# Patient Record
Sex: Female | Born: 1965
Health system: Southern US, Community
[De-identification: ages and names within clinical notes are randomized; demographics above are authoritative.]

## PROBLEM LIST (undated history)

## (undated) DIAGNOSIS — R0602 Shortness of breath: Secondary | ICD-10-CM

## (undated) DIAGNOSIS — J449 Chronic obstructive pulmonary disease, unspecified: Secondary | ICD-10-CM

## (undated) DIAGNOSIS — M1990 Unspecified osteoarthritis, unspecified site: Secondary | ICD-10-CM

## (undated) DIAGNOSIS — M2550 Pain in unspecified joint: Secondary | ICD-10-CM

## (undated) DIAGNOSIS — M2540 Effusion, unspecified joint: Secondary | ICD-10-CM

## (undated) DIAGNOSIS — R569 Unspecified convulsions: Secondary | ICD-10-CM

## (undated) DIAGNOSIS — R1310 Dysphagia, unspecified: Secondary | ICD-10-CM

## (undated) DIAGNOSIS — K219 Gastro-esophageal reflux disease without esophagitis: Secondary | ICD-10-CM

## (undated) DIAGNOSIS — Z8709 Personal history of other diseases of the respiratory system: Secondary | ICD-10-CM

## (undated) DIAGNOSIS — J45909 Unspecified asthma, uncomplicated: Secondary | ICD-10-CM

## (undated) DIAGNOSIS — E785 Hyperlipidemia, unspecified: Secondary | ICD-10-CM

## (undated) DIAGNOSIS — Z87442 Personal history of urinary calculi: Secondary | ICD-10-CM

## (undated) HISTORY — PX: ERCP: SHX60

---

## 1992-08-24 HISTORY — PX: LUMBAR LAMINECTOMY: SHX95

## 2000-08-24 HISTORY — PX: CHOLECYSTECTOMY: SHX55

## 2012-06-02 ENCOUNTER — Ambulatory Visit (INDEPENDENT_AMBULATORY_CARE_PROVIDER_SITE_OTHER): Payer: Self-pay

## 2012-06-02 ENCOUNTER — Encounter: Payer: Self-pay | Admitting: Family Medicine

## 2012-06-02 ENCOUNTER — Ambulatory Visit (INDEPENDENT_AMBULATORY_CARE_PROVIDER_SITE_OTHER): Payer: Self-pay | Admitting: Family Medicine

## 2012-06-02 VITALS — BP 127/80 | HR 66 | Ht 62.0 in | Wt 209.0 lb

## 2012-06-02 DIAGNOSIS — M25551 Pain in right hip: Secondary | ICD-10-CM

## 2012-06-02 DIAGNOSIS — M25552 Pain in left hip: Secondary | ICD-10-CM

## 2012-06-02 DIAGNOSIS — M169 Osteoarthritis of hip, unspecified: Secondary | ICD-10-CM | POA: Insufficient documentation

## 2012-06-02 DIAGNOSIS — J45909 Unspecified asthma, uncomplicated: Secondary | ICD-10-CM | POA: Insufficient documentation

## 2012-06-02 DIAGNOSIS — M25559 Pain in unspecified hip: Secondary | ICD-10-CM

## 2012-06-02 MED ORDER — MELOXICAM 15 MG PO TABS: 15.0000 mg | ORAL_TABLET | Freq: Every day | ORAL | Status: DC

## 2012-06-02 NOTE — Patient Instructions (Addendum)
We will call you with the xray results.   Make an appt with Dr. Karie Schwalbe for your hips.

## 2012-06-02 NOTE — Progress Notes (Signed)
Subjective:    Patient ID: Robin [name], female    DOB: October 12, 1965, 46 y.o.   MRN: [id number]  HPI Asthma- Says her asthma flares when the weather changes or when has a cold.  Does well on Advair.  Doing well.  She is out of her albuterol.    Hip pain - Pain since 2007. Saw a PA for awhile.  Was told could be OA, says has been getting worse. Saw a ortho specialist  6 months ago, who treated her for spinal stenosis from her back surgery (1994). Started on meloxicam.  Needs refill.  Never had  Imaging of her hips. Has dec ROM and hard to put on her socks.  She currently uses a cane. Her pain is worse when she's been sitting for prolonged periods. No known trauma or injuries.   Review of Systems  Constitutional: Negative for fever, diaphoresis and unexpected weight change.  HENT: Positive for tinnitus. Negative for hearing loss and rhinorrhea.   Eyes: Negative for visual disturbance.  Respiratory: Positive for cough. Negative for wheezing.   Cardiovascular: Positive for chest pain. Negative for palpitations.  Gastrointestinal: Negative for nausea, vomiting, diarrhea and blood in stool.  Genitourinary: Negative for vaginal bleeding, vaginal discharge and difficulty urinating.  Musculoskeletal: Positive for joint swelling and arthralgias. Negative for myalgias.  Skin: Negative for rash.  Neurological: Negative for headaches.  Hematological: Negative for adenopathy. Does not bruise/bleed easily.  Psychiatric/Behavioral: Negative for disturbed wake/sleep cycle and dysphoric mood. The patient is not nervous/anxious.    BP 127/80  Pulse 66  Ht 5\' 2"  (1.575 m)  Wt 209 lb (94.802 kg)  BMI 38.23 kg/m2    No Known Allergies  History reviewed. No pertinent past medical history.  Past Surgical History  Procedure Date  . Lumbar laminectomy 1994  . Cholecystectomy 2002    History   Social History  . Marital Status: Married    Spouse Name: N/A    Number of Children: 1  . Years of  Education: N/A   Occupational History  . House Keeper    Social History Main Topics  . Smoking status: Former Smoker    Quit date: 01/11/2012  . Smokeless tobacco: Not on file  . Alcohol Use: No  . Drug Use: No  . Sexually Active: Not Currently   Other Topics Concern  . Not on file   Social History Narrative  . No narrative on file    Family History  Problem Relation Age of Onset  . Alcohol abuse Brother   . Alcohol abuse Sister   . Heart attack Father   . Diabetes      grandparents.     Outpatient Encounter Prescriptions as of 06/02/2012  Medication Sig Dispense Refill  . albuterol (PROVENTIL HFA;VENTOLIN HFA) 108 (90 BASE) MCG/ACT inhaler Inhale 1 puff into the lungs as needed.      . Fluticasone-Salmeterol (ADVAIR) 250-50 MCG/DOSE AEPB Inhale 2 puffs into the lungs as needed.      . Ibuprofen (ADVIL) 200 MG CAPS Take 1 capsule by mouth 3 (three) times daily as needed.      . meloxicam (MOBIC) 15 MG tablet Take 1 tablet (15 mg total) by mouth daily.  30 tablet  3  . DISCONTD: meloxicam (MOBIC) 15 MG tablet Take 15 mg by mouth daily.              Objective:   Physical Exam  Constitutional: She is oriented to person, place, and time. She appears well-developed  and well-nourished.  HENT:  Head: Normocephalic and atraumatic.  Right Ear: External ear normal.  Left Ear: External ear normal.  Nose: Nose normal.  Mouth/Throat: Oropharynx is clear and moist.       Left TMand canals are clear. Right TM blocked by cerumen  Eyes: Conjunctivae normal and EOM are normal. Pupils are equal, round, and reactive to light.  Neck: Neck supple. No thyromegaly present.  Cardiovascular: Normal rate, regular rhythm and normal heart sounds.   Pulmonary/Chest: Effort normal and breath sounds normal. She has no wheezes.  Musculoskeletal:       Hips with normal flexion. Decreased internal and external rotation bilaterally. She had significant pain with rotation. Hip strength is 5 out  of 5. Knee strength is 5 out of 5 and ankle strength is 5 out of 5.  Lymphadenopathy:    She has no cervical adenopathy.  Neurological: She is alert and oriented to person, place, and time.  Skin: Skin is warm and dry.  Psychiatric: She has a normal mood and affect.          Assessment & Plan:  Asthma - currently well controlled on Advair. She does not need refills except for the albuterol. We gave her a sample. Call if she's having any problems or concerns. She did decline the flu shot that I did recommend that she is high risk with her asthma.  Hip Pain - I. would like to start with at least plain film x-rays which she has never had this evaluated she has some significant arthritis bone spurring, or loose body. She seems to be fairly debilitated by using a cane and has a lot of difficulty getting up on the exam table. Depending on the results potentially like to see my partner Dr. Benjamin Stain for further evaluation and treatment. She might benefit from injections for pain relief. 4 MA be severe enough that she might need hip replacement. For now refill her MOBIC since it does seem to be helping her pain.

## 2012-06-03 ENCOUNTER — Ambulatory Visit (INDEPENDENT_AMBULATORY_CARE_PROVIDER_SITE_OTHER): Payer: Self-pay | Admitting: Sports Medicine

## 2012-06-03 ENCOUNTER — Encounter: Payer: Self-pay | Admitting: Sports Medicine

## 2012-06-03 VITALS — BP 123/81 | HR 63 | Wt 211.0 lb

## 2012-06-03 DIAGNOSIS — M169 Osteoarthritis of hip, unspecified: Secondary | ICD-10-CM

## 2012-06-03 DIAGNOSIS — M1610 Unilateral primary osteoarthritis, unspecified hip: Secondary | ICD-10-CM

## 2012-06-03 MED ORDER — TRAMADOL HCL 50 MG PO TABS
50.0000 mg | ORAL_TABLET | Freq: Four times a day (QID) | ORAL | Status: DC | PRN
Start: 2012-06-03 — End: 2012-10-11

## 2012-06-03 NOTE — Progress Notes (Signed)
Subjective:    I'm seeing this patient as a consultation for:  Dr. Linford Arnold  CC: Bilateral groin pain  HPI: Robin [name] is a very pleasant 46 year old female with an unfortunate history of 5 years of pain in both hips. She has had x-rays in the past that show moderate to severe degenerative joint disease bilaterally. Unfortunately, she has not had insurance, and has been unable to find an orthopedic surgeon that could operate. The pain is localized, and makes walking very difficult. In fact, she has had several falls. She does ambulate with a cane now. She has never had intra-articular injections, and has been using NSAIDs for pain.  Past medical history, Surgical history, Family history, Social history, Allergies, and medications have been entered into the medical record, reviewed, and no changes needed.   Review of Systems: No headache, visual changes, nausea, vomiting, diarrhea, constipation, dizziness, abdominal pain, skin rash, fevers, chills, night sweats, weight loss, swollen lymph nodes, body aches, joint swelling, muscle aches, chest pain, or shortness of breath.   Objective:   Vitals:  Afebrile, vital signs stable. General: Well Developed, well nourished, and in no acute distress.  Neuro/Psych: Alert and oriented x3, extra-ocular muscles intact, able to move all 4 extremities.  Skin: Warm and dry, no rashes noted.  Respiratory: Not using accessory muscles, speaking in full sentences, trachea midline.  Cardiovascular: Pulses palpable, no extremity edema. Abdomen: Does not appear distended. Bilateral Hip: ROM Range of motion is extremely limited by pain, she has approximately 5 of internal, and 10 of external rotation bilaterally.  Strength IR: 5/5, ER: 5/5, Flexion: 5/5, Extension: 5/5, Abduction: 5/5, Adduction: 5/5 Pelvic alignment unremarkable to inspection and palpation. Greater trochanter without tenderness to palpation. No tenderness over piriformis and greater  trochanter.  I did review her x-rays independently, her AP pelvis shows end-stage DJD of both hip joints. These were not weightbearing films.   Procedure: Real-time Ultrasound Guided Injection of left femoral acetabular joint Device: GE Logiq E  Ultrasound guided injection is preferred based studies that show increased duration, increased effect, greater accuracy, decreased procedural pain, increased response rate, and decreased cost with ultrasound guided versus blind injection.  Verbal informed consent obtained.  Time-out conducted.  Noted no overlying erythema, induration, or other signs of local infection.  Skin prepped in a sterile fashion.  Local anesthesia: Topical Ethyl chloride.  With sterile technique and under real time ultrasound guidance:  I imaged her joints, the anatomy was very distorted. I guided a spinal needle under ultrasound guidance to the femoral head/neck junction, and contacted bone. At that point I injected 2 cc of Kenalog 40, and 4 cc of lidocaine into the femoral acetabular joint, the capsule was seen distending under ultrasound guidance. Completed without difficulty  Pain immediately resolved suggesting accurate placement of the medication.  Advised to call if fevers/chills, erythema, induration, drainage, or persistent bleeding.  Images permanently stored and available for review in the ultrasound unit.  Impression: Technically successful ultrasound guided injection.  Procedure: Real-time Ultrasound Guided Injection of right femoral acetabular joint Device: GE Logiq E  Ultrasound guided injection is preferred based studies that show increased duration, increased effect, greater accuracy, decreased procedural pain, increased response rate, and decreased cost with ultrasound guided versus blind injection.  Verbal informed consent obtained.  Time-out conducted.  Noted no overlying erythema, induration, or other signs of local infection.  Skin prepped in a sterile  fashion.  Local anesthesia: Topical Ethyl chloride.  With sterile technique and under real time ultrasound  guidance:  I imaged her joints, the anatomy was very distorted. I guided a spinal needle under ultrasound guidance to the femoral head/neck junction, and contacted bone. At that point I injected 2 cc of Kenalog 40, and 4 cc of lidocaine into the femoral acetabular joint, the capsule was seen distending under ultrasound guidance. Completed without difficulty  Pain immediately resolved suggesting accurate placement of the medication.  Advised to call if fevers/chills, erythema, induration, drainage, or persistent bleeding.  Images permanently stored and available for review in the ultrasound unit.  Impression: Technically successful ultrasound guided injection.  Impression and Recommendations:   This case required medical decision making of moderate complexity.

## 2012-06-03 NOTE — Assessment & Plan Note (Addendum)
This is end stage. Bilateral ultrasound guided femoral acetabular injections given today for relief. She notes that this is the first time in 5 years that she has been completely without pain, she was informed however that the pain relief is transient due to the lidocaine, but that the steroid would pickup in a day or 2. Referral to [facility], Dr. Gean Birchwood. She will need bilateral hip replacements eventually. Tramadol for pain in the meantime. I can perform this injection procedure up to every 3 months as needed in this patient until we can find her an orthopedic surgeon.

## 2012-06-06 ENCOUNTER — Encounter: Payer: Self-pay | Admitting: Family Medicine

## 2012-07-04 ENCOUNTER — Telehealth: Payer: Self-pay | Admitting: *Deleted

## 2012-07-04 ENCOUNTER — Ambulatory Visit (INDEPENDENT_AMBULATORY_CARE_PROVIDER_SITE_OTHER): Payer: Self-pay | Admitting: Family Medicine

## 2012-07-04 ENCOUNTER — Encounter: Payer: Self-pay | Admitting: Family Medicine

## 2012-07-04 VITALS — BP 130/69 | HR 74 | Ht 62.0 in | Wt 206.0 lb

## 2012-07-04 DIAGNOSIS — Z0000 Encounter for general adult medical examination without abnormal findings: Secondary | ICD-10-CM

## 2012-07-04 LAB — LIPID PANEL
Cholesterol: 198 mg/dL (ref 0–200)
Total CHOL/HDL Ratio: 3.6 Ratio
Triglycerides: 118 mg/dL (ref <150)
VLDL: 24 mg/dL (ref 0–40)

## 2012-07-04 LAB — COMPLETE METABOLIC PANEL WITH GFR
AST: 12 U/L (ref 0–37)
Albumin: 4.1 g/dL (ref 3.5–5.2)
Alkaline Phosphatase: 61 U/L (ref 39–117)
BUN: 18 mg/dL (ref 6–23)
Calcium: 9.1 mg/dL (ref 8.4–10.5)
Creat: 0.68 mg/dL (ref 0.50–1.10)
GFR, Est Non African American: >89 mL/min
Glucose, Bld: 93 mg/dL (ref 70–99)
Potassium: 4.2 mEq/L (ref 3.5–5.3)

## 2012-07-04 MED ORDER — ALBUTEROL SULFATE (5 MG/ML) 0.5% IN NEBU: 2.5000 mg | INHALATION_SOLUTION | Freq: Four times a day (QID) | RESPIRATORY_TRACT | Status: DC | PRN

## 2012-07-04 MED ORDER — ALBUTEROL SULFATE (2.5 MG/3ML) 0.083% IN NEBU: 2.5000 mg | INHALATION_SOLUTION | Freq: Four times a day (QID) | RESPIRATORY_TRACT | Status: DC | PRN

## 2012-07-04 NOTE — Patient Instructions (Addendum)
Keep up a regular exercise program and make sure you are eating a healthy diet Try to eat 4 servings of dairy a day, or if you are lactose intolerant take a calcium with vitamin D daily.  Your vaccines are up to date.  We will call you with your lab results. If you don't here from us in about a week then please give us a call at [phone].  

## 2012-07-04 NOTE — Telephone Encounter (Signed)
[facility] called -Amber Wants to change Albuterol 0.5 % to  to 0.083% there is not mixing and cheaper

## 2012-07-04 NOTE — Progress Notes (Signed)
  Subjective:     Robin [name] is a 46 y.o. female and is here for a comprehensive physical exam. The patient reports no problems.  History   Social History  . Marital Status: Married    Spouse Name: N/A    Number of Children: 1  . Years of Education: N/A   Occupational History  . House Keeper    Social History Main Topics  . Smoking status: Former Smoker    Quit date: 10/07/2001  . Smokeless tobacco: Not on file  . Alcohol Use: No  . Drug Use: No  . Sexually Active: Not Currently   Other Topics Concern  . Not on file   Social History Narrative   No regular exercise.    Health Maintenance  Topic Date Due  . Pap Smear  03/16/1982  . Influenza Vaccine  04/24/2012  . Tetanus/tdap  07/04/2022    The following portions of the patient's history were reviewed and updated as appropriate: allergies, current medications, past family history, past medical history, past social history, past surgical history and problem list.  Review of Systems A comprehensive review of systems was negative.   Objective:    BP 130/69  Pulse 74  Ht 5\' 2"  (1.575 m)  Wt 206 lb (93.441 kg)  BMI 37.68 kg/m2 General appearance: alert, cooperative, appears stated age and moderately obese Head: Normocephalic, without obvious abnormality, atraumatic Eyes: conj clear, EOMii, PEERLA Ears: normal TM's and external ear canals both ears Nose: Nares normal. Septum midline. Mucosa normal. No drainage or sinus tenderness. Throat: lips, mucosa, and tongue normal; teeth and gums normal Neck: no adenopathy, no carotid bruit, no JVD, supple, symmetrical, trachea midline and thyroid not enlarged, symmetric, no tenderness/mass/nodules Back: symmetric, no curvature. ROM normal. No CVA tenderness. Lungs: clear to auscultation bilaterally Breasts: normal appearance, no masses or tenderness Heart: regular rate and rhythm, S1, S2 normal, no murmur, click, rub or gallop Abdomen: soft, non-tender; bowel sounds normal;  no masses,  no organomegaly Extremities: extremities normal, atraumatic, no cyanosis or edema Pulses: 2+ and symmetric Skin: Skin color, texture, turgor normal. No rashes or lesions Lymph nodes: Cervical, supraclavicular, and axillary nodes normal. Neurologic: Alert and oriented X 3, normal strength and tone. Normal symmetric reflexes. Normal coordination and gait    Assessment:    Healthy female exam.      Plan:     See After Visit Summary for Counseling Recommendations  Keep up a regular exercise program and make sure you are eating a healthy diet Try to eat 4 servings of dairy a day, or if you are lactose intolerant take a calcium with vitamin D daily.  Your vaccines are up to date.   Discussed need for Tdap an d flu vaccine.  Agreed to Tdap but unfortunatlely we are out. She will think about the flu vaccine.  Due for screening labs.

## 2012-07-04 NOTE — Telephone Encounter (Signed)
Ok, sounds good, lets change on the med list so when we re-order in the future it is right.

## 2012-07-05 ENCOUNTER — Telehealth: Payer: Self-pay | Admitting: Family Medicine

## 2012-07-05 ENCOUNTER — Encounter: Payer: Self-pay | Admitting: Sports Medicine

## 2012-07-05 ENCOUNTER — Ambulatory Visit (INDEPENDENT_AMBULATORY_CARE_PROVIDER_SITE_OTHER): Payer: Self-pay | Admitting: Sports Medicine

## 2012-07-05 VITALS — BP 120/72 | HR 67 | Wt 207.0 lb

## 2012-07-05 DIAGNOSIS — M169 Osteoarthritis of hip, unspecified: Secondary | ICD-10-CM

## 2012-07-05 DIAGNOSIS — M1610 Unilateral primary osteoarthritis, unspecified hip: Secondary | ICD-10-CM

## 2012-07-05 MED ORDER — ACETAMINOPHEN ER 650 MG PO TBCR: 1300.0000 mg | EXTENDED_RELEASE_TABLET | Freq: Three times a day (TID) | ORAL | Status: DC | PRN

## 2012-07-05 NOTE — Progress Notes (Signed)
SPORTS MEDICINE CONSULTATION REPORT  Subjective:    CC: Followup  HPI: Robin [name] comes back for followup after hip joint injections. She has severe, end-stage DJD on both sides. I injected her femoral acetabular joint at the last visit, this was a very technically difficult procedure. Her pain improved significantly, and remains significantly improved. She used her Mobic, and is now on tramadol which also helps significantly.   Past medical history, Surgical history, Family history, Social history, Allergies, and medications have been entered into the medical record, reviewed, and no changes needed.   Review of Systems: No headache, visual changes, nausea, vomiting, diarrhea, constipation, dizziness, abdominal pain, skin rash, fevers, chills, night sweats, weight loss, swollen lymph nodes, body aches, joint swelling, muscle aches, chest pain, or shortness of breath.   Objective:   Vitals:  Afebrile, vital signs stable. General: Well Developed, well nourished, and in no acute distress.  Neuro/Psych: Alert and oriented x3, extra-ocular muscles intact, able to move all 4 extremities.  Skin: Warm and dry, no rashes noted.  Respiratory: Not using accessory muscles, speaking in full sentences, trachea midline.  Cardiovascular: Pulses palpable, no extremity edema. Abdomen: Does not appear distended. Left hip has about 10 of internal rotation, right hip has about 5. She is painful at the end of both of these. She ambulates with a cane with a mildly antalgic gait.   Impression and Recommendations:   This case required medical decision making of moderate complexity.

## 2012-07-05 NOTE — Telephone Encounter (Signed)
Patient was seen today and had lab work done. She advised that her phone is currently off and if her lab results were to come in if you can mail the results to her. Thanks

## 2012-07-05 NOTE — Assessment & Plan Note (Signed)
Significantly improved after bilateral intra-articular hip joint injections one month ago. She should continue to work on range of motion, stretching, strengthening, and she will read as mobile as possible. She should continue tramadol, I would also like her to acetaminophen 652 tabs 3 times a day. She can come back in one to 2 months, and I am happy to reinject both sides.

## 2012-07-06 ENCOUNTER — Telehealth: Payer: Self-pay | Admitting: *Deleted

## 2012-07-07 NOTE — Telephone Encounter (Signed)
See other message

## 2012-08-01 ENCOUNTER — Encounter: Payer: Self-pay | Admitting: Sports Medicine

## 2012-08-01 ENCOUNTER — Ambulatory Visit (INDEPENDENT_AMBULATORY_CARE_PROVIDER_SITE_OTHER): Payer: Self-pay | Admitting: Sports Medicine

## 2012-08-01 VITALS — BP 130/79 | HR 93 | Wt 208.0 lb

## 2012-08-01 DIAGNOSIS — M169 Osteoarthritis of hip, unspecified: Secondary | ICD-10-CM

## 2012-08-01 DIAGNOSIS — M1610 Unilateral primary osteoarthritis, unspecified hip: Secondary | ICD-10-CM

## 2012-08-01 NOTE — Progress Notes (Signed)
SPORTS MEDICINE CONSULTATION REPORT  Subjective:    CC: Followup  HPI: Robin [name] has end-stage bilateral femoral acetabular degenerative joint disease.  I injected both femoral acetabular joints approximately 2 months ago. She got approximately 3 weeks of benefit. We also sent her to Dr. Turner Daniels with [facility], he is going to replace her right hip, and they will work out a payment plan as well as some funding for post surgical therapy. Today, she does desire an additional set of intra-articular injections which I think are quite appropriate. Her pain is localized in both groin, does not radiate, it is worse with walking, and is severe.  Past medical history, Surgical history, Family history, Social history, Allergies, and medications have been entered into the medical record, reviewed, and no changes needed.   Review of Systems: No headache, visual changes, nausea, vomiting, diarrhea, constipation, dizziness, abdominal pain, skin rash, fevers, chills, night sweats, weight loss, swollen lymph nodes, body aches, joint swelling, muscle aches, chest pain, shortness of breath, mood changes, visual or auditory hallucinations.   Objective:   Vitals:  Afebrile, vital signs stable. General: Well Developed, well nourished, and in no acute distress.  Neuro/Psych: Alert and oriented x3, extra-ocular muscles intact, able to move all 4 extremities.  Skin: Warm and dry, no rashes noted.  Respiratory: Not using accessory muscles, speaking in full sentences, trachea midline.  Cardiovascular: Pulses palpable, no extremity edema. Abdomen: Does not appear distended. Bilateral Hip: ROM Range of motion is extremely limited by pain, she has approximately 5 of internal, and 10 of external rotation bilaterally.  Strength IR: 5/5, ER: 5/5, Flexion: 5/5, Extension: 5/5, Abduction: 5/5, Adduction: 5/5 Pelvic alignment unremarkable to inspection and palpation. Greater trochanter without tenderness to  palpation. No tenderness over piriformis and greater trochanter.  Procedure: Real-time Ultrasound Guided Injection of left femoral acetabular joint Device: GE Logiq E  Ultrasound guided injection is preferred based studies that show increased duration, increased effect, greater accuracy, decreased procedural pain, increased response rate, and decreased cost with ultrasound guided versus blind injection.  Verbal informed consent obtained.  Time-out conducted.  Noted no overlying erythema, induration, or other signs of local infection.  Skin prepped in a sterile fashion.  Local anesthesia: Topical Ethyl chloride.  With sterile technique and under real time ultrasound guidance:  I imaged her joints, the anatomy was very distorted. I guided a spinal needle under ultrasound guidance to the femoral head/neck junction, and contacted bone. At that point I injected 2 cc of Kenalog 40, and 4 cc of lidocaine into the femoral acetabular joint, the capsule was seen distending under ultrasound guidance. Completed without difficulty  Pain immediately resolved suggesting accurate placement of the medication.  Advised to call if fevers/chills, erythema, induration, drainage, or persistent bleeding.  Images permanently stored and available for review in the ultrasound unit.  Impression: Technically successful ultrasound guided injection.  Procedure: Real-time Ultrasound Guided Injection of right femoral acetabular joint Device: GE Logiq E  Ultrasound guided injection is preferred based studies that show increased duration, increased effect, greater accuracy, decreased procedural pain, increased response rate, and decreased cost with ultrasound guided versus blind injection.  Verbal informed consent obtained.  Time-out conducted.  Noted no overlying erythema, induration, or other signs of local infection.  Skin prepped in a sterile fashion.  Local anesthesia: Topical Ethyl chloride.  With sterile technique  and under real time ultrasound guidance:  I imaged her joints, the anatomy was very distorted. I guided a spinal needle under ultrasound guidance to  the femoral head/neck junction, and contacted bone. At that point I injected 2 cc of Kenalog 40, and 4 cc of lidocaine into the femoral acetabular joint, the capsule was seen distending under ultrasound guidance. Completed without difficulty  Pain immediately resolved suggesting accurate placement of the medication.  Advised to call if fevers/chills, erythema, induration, drainage, or persistent bleeding.  Images permanently stored and available for review in the ultrasound unit.  Impression: Technically successful ultrasound guided injection.  Impression and Recommendations:   This case required medical decision making of moderate complexity.

## 2012-08-01 NOTE — Assessment & Plan Note (Addendum)
She is getting set up with [facility] for THA. Injected both sides today. RTC prn for additional injections.  I will inject up to q 6-8 weeks until THA.

## 2012-09-23 ENCOUNTER — Other Ambulatory Visit: Payer: Self-pay | Admitting: *Deleted

## 2012-09-23 MED ORDER — FLUTICASONE-SALMETEROL 250-50 MCG/DOSE IN AEPB: 1.0000 | INHALATION_SPRAY | Freq: Two times a day (BID) | RESPIRATORY_TRACT | Status: DC

## 2012-09-23 MED ORDER — FLUTICASONE-SALMETEROL 250-50 MCG/DOSE IN AEPB: 2.0000 | INHALATION_SPRAY | RESPIRATORY_TRACT | Status: DC | PRN

## 2012-10-11 ENCOUNTER — Other Ambulatory Visit: Payer: Self-pay | Admitting: Sports Medicine

## 2012-10-17 ENCOUNTER — Ambulatory Visit (INDEPENDENT_AMBULATORY_CARE_PROVIDER_SITE_OTHER): Payer: Self-pay | Admitting: Sports Medicine

## 2012-10-17 DIAGNOSIS — M169 Osteoarthritis of hip, unspecified: Secondary | ICD-10-CM

## 2012-10-17 DIAGNOSIS — M1610 Unilateral primary osteoarthritis, unspecified hip: Secondary | ICD-10-CM

## 2012-10-17 MED ORDER — TRAMADOL HCL 50 MG PO TABS: 100.0000 mg | ORAL_TABLET | Freq: Three times a day (TID) | ORAL | Status: DC | PRN

## 2012-10-17 NOTE — Assessment & Plan Note (Addendum)
Bilateral femoral acetabular joint injection as above. To increase her pain medication dosage to 100 mg of tramadol 3 times a day, she will also use 1300 mg of acetaminophen with each tramadol dose. Return in 6 weeks as needed for further injections. She's currently undergoing approval for total hip arthroplasty.

## 2012-10-17 NOTE — Progress Notes (Signed)
  Subjective:    CC: Bilateral hip pain  HPI: This is a very pleasant 47 year old female with well known severe, end-stage bilateral femoral acetabular degenerative joint disease. I have injected both hip joints twice now, the most recent of which was approximately 2 months ago. She has been to [facility], and is currently getting approved for total hip arthroplasty. Pain is again localized in the groin, radiating around the lateral aspect of both hips, severe, worse with weightbearing. She continues to use tramadol and acetaminophen with only moderate efficacy. She does desire repeat set of injections.  Past medical history, Surgical history, Family history not pertinant except as noted below, Social history, Allergies, and medications have been entered into the medical record, reviewed, and no changes needed.   Review of Systems: No headache, visual changes, nausea, vomiting, diarrhea, constipation, dizziness, abdominal pain, skin rash, fevers, chills, night sweats, weight loss, swollen lymph nodes, body aches, joint swelling, muscle aches, chest pain, shortness of breath, mood changes, visual or auditory hallucinations.   Objective:   General: Well Developed, well nourished, and in no acute distress.  Neuro/Psych: Alert and oriented x3, extra-ocular muscles intact, able to move all 4 extremities, sensation grossly intact. Skin: Warm and dry, no rashes noted.  Respiratory: Not using accessory muscles, speaking in full sentences, trachea midline.  Cardiovascular: Pulses palpable, no extremity edema. Abdomen: Does not appear distended.  Procedure: Real-time Ultrasound Guided Injection of left femoroacetabular joint injection. Device: GE Logiq E  Ultrasound guided injection is preferred based studies that show increased duration, increased effect, greater accuracy, decreased procedural pain, increased response rate, and decreased cost with ultrasound guided versus blind injection.    Verbal informed consent obtained.  Time-out conducted.  Noted no overlying erythema, induration, or other signs of local infection.  Skin prepped in a sterile fashion.  Local anesthesia: Topical Ethyl chloride.  With sterile technique and under real time ultrasound guidance:  Anatomy appeared significantly distorted ultrasound, spinal needle advanced under real-time guidance to the femoral head/neck junction, 2 cc Kenalog 40, 4 cc lidocaine injected easily. Completed without difficulty  Pain immediately resolved suggesting accurate placement of the medication.  Advised to call if fevers/chills, erythema, induration, drainage, or persistent bleeding.  Images permanently stored and available for review in the ultrasound unit.  Impression: Technically successful ultrasound guided injection.   Procedure: Real-time Ultrasound Guided Injection of right femoroacetabular joint injection. Device: GE Logiq E  Ultrasound guided injection is preferred based studies that show increased duration, increased effect, greater accuracy, decreased procedural pain, increased response rate, and decreased cost with ultrasound guided versus blind injection.  Verbal informed consent obtained.  Time-out conducted.  Noted no overlying erythema, induration, or other signs of local infection.  Skin prepped in a sterile fashion.  Local anesthesia: Topical Ethyl chloride.  With sterile technique and under real time ultrasound guidance:  Anatomy appeared significantly distorted ultrasound, spinal needle advanced under real-time guidance to the femoral head/neck junction, 2 cc Kenalog 40, 4 cc lidocaine injected easily. Completed without difficulty  Pain immediately resolved suggesting accurate placement of the medication.  Advised to call if fevers/chills, erythema, induration, drainage, or persistent bleeding.  Images permanently stored and available for review in the ultrasound unit.  Impression: Technically  successful ultrasound guided injection.  Impression and Recommendations:   This case required medical decision making of moderate complexity.

## 2013-03-16 ENCOUNTER — Other Ambulatory Visit: Payer: Self-pay | Admitting: Sports Medicine

## 2013-03-16 ENCOUNTER — Other Ambulatory Visit: Payer: Self-pay

## 2013-03-16 DIAGNOSIS — M169 Osteoarthritis of hip, unspecified: Secondary | ICD-10-CM

## 2013-03-16 MED ORDER — TRAMADOL HCL 50 MG PO TABS: 100.0000 mg | ORAL_TABLET | Freq: Three times a day (TID) | ORAL | Status: DC | PRN

## 2013-06-29 ENCOUNTER — Other Ambulatory Visit: Payer: Self-pay

## 2013-07-11 ENCOUNTER — Other Ambulatory Visit: Payer: Self-pay | Admitting: Sports Medicine

## 2013-07-11 ENCOUNTER — Other Ambulatory Visit: Payer: Self-pay | Admitting: *Deleted

## 2013-07-11 ENCOUNTER — Other Ambulatory Visit: Payer: Self-pay

## 2013-07-11 MED ORDER — TRAMADOL HCL 50 MG PO TABS: ORAL_TABLET | ORAL | Status: DC

## 2013-07-31 ENCOUNTER — Telehealth: Payer: Self-pay

## 2013-07-31 NOTE — Telephone Encounter (Signed)
Patient called request a refill for Tramadol. Rhonda Cunningham,CMA  

## 2013-08-01 ENCOUNTER — Other Ambulatory Visit: Payer: Self-pay

## 2013-08-01 MED ORDER — TRAMADOL HCL (ER BIPHASIC) 300 MG PO CP24: 300.0000 mg | ORAL_CAPSULE | Freq: Every day | ORAL | Status: DC | PRN

## 2013-08-01 NOTE — Telephone Encounter (Signed)
Rx has been faxed to [facility] Dr.  Estelle June

## 2013-08-01 NOTE — Telephone Encounter (Signed)
One month tramadol extended release 300 mg once a day given, she needs to go ahead and set up a visit with me for consideration of another injection if she has not yet had her hip replacement. Rx in my box.

## 2013-08-07 ENCOUNTER — Other Ambulatory Visit: Payer: Self-pay

## 2013-08-07 ENCOUNTER — Ambulatory Visit: Payer: Self-pay | Admitting: Sports Medicine

## 2013-08-07 ENCOUNTER — Telehealth: Payer: Self-pay

## 2013-08-07 MED ORDER — TRAMADOL HCL 50 MG PO TABS: ORAL_TABLET | ORAL | Status: DC

## 2013-08-07 NOTE — Telephone Encounter (Signed)
Switching to regular tramadol.

## 2013-08-07 NOTE — Telephone Encounter (Signed)
Patient called stated that she was prescribed Tramadol ER and she is requesting Tramadol 50 mg due the price of the Tramadol ER. Alene Bergerson,CMA

## 2013-08-18 ENCOUNTER — Encounter: Payer: Self-pay | Admitting: Sports Medicine

## 2013-08-18 ENCOUNTER — Ambulatory Visit (INDEPENDENT_AMBULATORY_CARE_PROVIDER_SITE_OTHER): Payer: Self-pay | Admitting: Sports Medicine

## 2013-08-18 VITALS — BP 124/75 | HR 85 | Wt 217.0 lb

## 2013-08-18 DIAGNOSIS — M1610 Unilateral primary osteoarthritis, unspecified hip: Secondary | ICD-10-CM

## 2013-08-18 DIAGNOSIS — M169 Osteoarthritis of hip, unspecified: Secondary | ICD-10-CM

## 2013-08-18 MED ORDER — TRAMADOL HCL 50 MG PO TABS: ORAL_TABLET | ORAL | Status: DC

## 2013-08-18 NOTE — Progress Notes (Signed)
   Procedure: Real-time Ultrasound Guided Injection of left femoral acetabular joint Device: GE Logiq E  Verbal informed consent obtained.  Time-out conducted.  Noted no overlying erythema, induration, or other signs of local infection.  Skin prepped in a sterile fashion.  Local anesthesia: Topical Ethyl chloride.  With sterile technique and under real time ultrasound guidance:  Spinal needle events to the femoral head/neck junction, bounced off of bone, and 1 cc Depo-Medrol 40, 2 cc lidocaine, 2 cc Marcaine injected easily. Completed without difficulty  Pain immediately resolved suggesting accurate placement of the medication.  Advised to call if fevers/chills, erythema, induration, drainage, or persistent bleeding.  Images permanently stored and available for review in the ultrasound unit.  Impression: Technically successful ultrasound guided injection.  Procedure: Real-time Ultrasound Guided Injection of right femoral acetabular joint Device: GE Logiq E  Verbal informed consent obtained.  Time-out conducted.  Noted no overlying erythema, induration, or other signs of local infection.  Skin prepped in a sterile fashion.  Local anesthesia: Topical Ethyl chloride.  With sterile technique and under real time ultrasound guidance:  Spinal needle advanced to the femoral head/neck junction, bone was contacted, and 1 cc Depo-Medrol 40, 2 cc lidocaine, 2 cc Marcaine was injected easily. Completed without difficulty  Pain immediately resolved suggesting accurate placement of the medication.  Advised to call if fevers/chills, erythema, induration, drainage, or persistent bleeding.  Images permanently stored and available for review in the ultrasound unit.  Impression: Technically successful ultrasound guided injection.

## 2013-08-18 NOTE — Assessment & Plan Note (Addendum)
Bilateral injection as above. Return as needed. Refilling tramadol. She is still awaiting total hip arthroplasty bilaterally, she still has no insurance.

## 2013-09-27 ENCOUNTER — Telehealth: Payer: Self-pay

## 2013-09-27 ENCOUNTER — Other Ambulatory Visit: Payer: Self-pay

## 2013-09-27 DIAGNOSIS — M169 Osteoarthritis of hip, unspecified: Secondary | ICD-10-CM

## 2013-09-27 MED ORDER — TRAMADOL HCL 50 MG PO TABS: ORAL_TABLET | ORAL | Status: DC

## 2013-09-27 NOTE — Telephone Encounter (Signed)
Prescription is in my box 

## 2013-09-27 NOTE — Telephone Encounter (Signed)
Patient request refill for Tramadol. Dyane Broberg,CMA  

## 2013-10-20 ENCOUNTER — Telehealth: Payer: Self-pay

## 2013-10-20 DIAGNOSIS — M169 Osteoarthritis of hip, unspecified: Secondary | ICD-10-CM

## 2013-10-20 NOTE — Telephone Encounter (Signed)
Patient request refill for Tramadol. Caio Devera,CMA  

## 2013-10-23 MED ORDER — TRAMADOL HCL 50 MG PO TABS: ORAL_TABLET | ORAL | Status: DC

## 2013-10-23 NOTE — Telephone Encounter (Signed)
[facility] Agripina Guyette,CMA

## 2013-10-23 NOTE — Telephone Encounter (Signed)
Rx in box. 

## 2013-11-09 ENCOUNTER — Other Ambulatory Visit: Payer: Self-pay | Admitting: Family Medicine

## 2013-11-10 ENCOUNTER — Encounter (HOSPITAL_COMMUNITY): Payer: Self-pay | Admitting: Pharmacy Technician

## 2013-11-12 ENCOUNTER — Other Ambulatory Visit: Payer: Self-pay | Admitting: Sports Medicine

## 2013-11-13 ENCOUNTER — Ambulatory Visit (INDEPENDENT_AMBULATORY_CARE_PROVIDER_SITE_OTHER): Payer: BC Managed Care – PPO | Admitting: Family Medicine

## 2013-11-13 ENCOUNTER — Encounter: Payer: Self-pay | Admitting: Family Medicine

## 2013-11-13 VITALS — BP 126/68 | HR 87 | Temp 99.0°F | Resp 20 | Ht 62.0 in | Wt 224.0 lb

## 2013-11-13 DIAGNOSIS — J309 Allergic rhinitis, unspecified: Secondary | ICD-10-CM

## 2013-11-13 DIAGNOSIS — J45901 Unspecified asthma with (acute) exacerbation: Secondary | ICD-10-CM

## 2013-11-13 MED ORDER — PREDNISONE 20 MG PO TABS: 40.0000 mg | ORAL_TABLET | Freq: Every day | ORAL | Status: DC

## 2013-11-13 MED ORDER — METHYLPREDNISOLONE SODIUM SUCC 125 MG IJ SOLR
125.0000 mg | Freq: Once | INTRAMUSCULAR | Status: AC
  Administered 2013-11-13: 125 mg via INTRAMUSCULAR

## 2013-11-13 MED ORDER — ALBUTEROL SULFATE (2.5 MG/3ML) 0.083% IN NEBU
2.5000 mg | INHALATION_SOLUTION | Freq: Once | RESPIRATORY_TRACT | Status: AC
  Administered 2013-11-13: 2.5 mg via RESPIRATORY_TRACT

## 2013-11-13 MED ORDER — METHYLPREDNISOLONE SODIUM SUCC 125 MG IJ SOLR: 125.0000 mg | Freq: Once | INTRAMUSCULAR | Status: DC

## 2013-11-13 MED ORDER — FLUTICASONE PROPIONATE 50 MCG/ACT NA SUSP: 2.0000 | Freq: Every day | NASAL | Status: DC

## 2013-11-13 MED ORDER — MONTELUKAST SODIUM 10 MG PO TABS: 10.0000 mg | ORAL_TABLET | Freq: Every day | ORAL | Status: DC

## 2013-11-13 MED ORDER — FLUTICASONE-SALMETEROL 250-50 MCG/DOSE IN AEPB: 1.0000 | INHALATION_SPRAY | Freq: Two times a day (BID) | RESPIRATORY_TRACT | Status: DC

## 2013-11-13 NOTE — Progress Notes (Signed)
Spoke with Darl PikesSusan, nurse at Dr Turner Danielsowan office regarding patient and new rx for prednisone for 5 days and a shot of Solumedrol here in office. Per Dr Turner Danielsowan it is ok to get this.

## 2013-11-13 NOTE — Progress Notes (Signed)
Subjective:    Patient ID: Robin [name], female    DOB: 1966-05-22, 48 y.o.   MRN: [id number]  HPI Ran out of Advair a couple fo months ago. Did ok until about 2 weeks ago. Sxs have been waking her up.  Has been using her daughters albuterol every 3-4 hours .  She would like refills,  She has been taking zyrtec once a day. She is not on any nasal sprays.  She is having hip replacement on April 3rd.  She wants to be able to make the surgery if at all possible. She doesn't want her acute symptoms do interfere with this. She's been waiting for quite some time to have this hip or placement.    oReview of Systems  BP 126/68  Pulse 87  Temp(Src) 99 F (37.2 C) (Oral)  Resp 20  Ht 5\' 2"  (1.575 m)  Wt 224 lb (101.606 kg)  BMI 40.96 kg/m2  SpO2 95%  PF 370 L/min    No Known Allergies  No past medical history on file.  Past Surgical History  Procedure Laterality Date  . Lumbar laminectomy  1994  . Cholecystectomy  2002    History   Social History  . Marital Status: Married    Spouse Name: N/A    Number of Children: 1  . Years of Education: N/A   Occupational History  . House Keeper    Social History Main Topics  . Smoking status: Former Smoker    Quit date: 10/07/2001  . Smokeless tobacco: Not on file  . Alcohol Use: No  . Drug Use: No  . Sexual Activity: Not Currently   Other Topics Concern  . Not on file   Social History Narrative   No regular exercise.     Family History  Problem Relation Age of Onset  . Alcohol abuse Brother   . Alcohol abuse Sister   . Heart attack Father   . Diabetes      grandparents.     Outpatient Encounter Prescriptions as of 11/13/2013  Medication Sig  . acetaminophen (TYLENOL) 650 MG CR tablet Take 2 tablets (1,300 mg total) by mouth every 8 (eight) hours as needed for pain.  Marland Kitchen. Fluticasone-Salmeterol (ADVAIR) 250-50 MCG/DOSE AEPB Inhale 1 puff into the lungs 2 (two) times daily.  Marland Kitchen. OVER THE COUNTER MEDICATION Apply 1  application topically 2 (two) times daily as needed (arthritis). Two old goats cream  . traMADol (ULTRAM) 50 MG tablet 1-2 tabs by mouth Q8 hours, maximum 6 tabs per day.  . [DISCONTINUED] Fluticasone-Salmeterol (ADVAIR) 250-50 MCG/DOSE AEPB Inhale 1 puff into the lungs 2 (two) times daily.  Marland Kitchen. albuterol (PROVENTIL) (2.5 MG/3ML) 0.083% nebulizer solution Take 3 mLs (2.5 mg total) by nebulization every 6 (six) hours as needed for wheezing.  . fluticasone (FLONASE) 50 MCG/ACT nasal spray Place 2 sprays into both nostrils daily.  . montelukast (SINGULAIR) 10 MG tablet Take 1 tablet (10 mg total) by mouth at bedtime.  . predniSONE (DELTASONE) 20 MG tablet Take 2 tablets (40 mg total) by mouth daily.  . [DISCONTINUED] methylPREDNISolone sodium succinate (SOLU-MEDROL) 125 mg/2 mL injection Inject 2 mLs (125 mg total) into the muscle once.  Marland Kitchen. albuterol (PROVENTIL) (2.5 MG/3ML) 0.083% nebulizer solution 2.5 mg   . albuterol (PROVENTIL) (2.5 MG/3ML) 0.083% nebulizer solution 2.5 mg   . [EXPIRED] methylPREDNISolone sodium succinate (SOLU-MEDROL) 125 mg/2 mL injection 125 mg            Objective:   Physical Exam  Constitutional: She is oriented to person, place, and time. She appears well-developed and well-nourished.  HENT:  Head: Normocephalic and atraumatic.  Right Ear: External ear normal.  Left Ear: External ear normal.  Nose: Nose normal.  Mouth/Throat: Oropharynx is clear and moist.  TMs and canals are clear.   Eyes: Conjunctivae and EOM are normal. Pupils are equal, round, and reactive to light.  Neck: Neck supple. No thyromegaly present.  Cardiovascular: Normal rate, regular rhythm and normal heart sounds.   Pulmonary/Chest: Effort normal. She has wheezes.  gen expiratory wheezing  Lymphadenopathy:    She has no cervical adenopathy.  Neurological: She is alert and oriented to person, place, and time.  Skin: Skin is warm and dry.  Psychiatric: She has a normal mood and affect.           Assessment & Plan:  Asthma exacerbation - Peak flows in the yellow zone. Given alubterol neb tx in the office.  2 treatments given the office today. Her final peak flow was 370. Given Solu-Medrol and 25 mg IM. Will put her on 5 days of prednisone. Make sure take with food and water to avoid GI upset. She can split the doses if needed. We'll restart her Advair 250/50. Coupon card given as well. Also to help better treat her allergies we will add Singulair to her Zyrtec. Also start fluticasone. Call if not responding to treatment and the next 4-5 days.  Allergic rhinitis-see note above.  Also to help better treat her allergies we will add Singulair to her Zyrtec. Also start fluticasone. Call if not responding to treatment and the next 4-5 days.

## 2013-11-14 ENCOUNTER — Other Ambulatory Visit: Payer: Self-pay | Admitting: Orthopedic Surgery

## 2013-11-15 ENCOUNTER — Encounter (HOSPITAL_COMMUNITY)
Admission: RE | Admit: 2013-11-15 | Discharge: 2013-11-15 | Disposition: A | Discharge status: Home / Self Care | Payer: BC Managed Care – PPO | Source: Ambulatory Visit | Attending: Orthopedic Surgery | Admitting: Orthopedic Surgery

## 2013-11-15 ENCOUNTER — Encounter (HOSPITAL_COMMUNITY): Payer: Self-pay

## 2013-11-15 DIAGNOSIS — Z01812 Encounter for preprocedural laboratory examination: Secondary | ICD-10-CM | POA: Insufficient documentation

## 2013-11-15 DIAGNOSIS — Z01810 Encounter for preprocedural cardiovascular examination: Secondary | ICD-10-CM | POA: Insufficient documentation

## 2013-11-15 DIAGNOSIS — Z01818 Encounter for other preprocedural examination: Secondary | ICD-10-CM | POA: Insufficient documentation

## 2013-11-15 HISTORY — DX: Chronic obstructive pulmonary disease, unspecified: J44.9

## 2013-11-15 HISTORY — DX: Unspecified osteoarthritis, unspecified site: M19.90

## 2013-11-15 HISTORY — DX: Hyperlipidemia, unspecified: E78.5

## 2013-11-15 HISTORY — DX: Effusion, unspecified joint: M25.40

## 2013-11-15 HISTORY — DX: Personal history of other diseases of the respiratory system: Z87.09

## 2013-11-15 HISTORY — DX: Pain in unspecified joint: M25.50

## 2013-11-15 HISTORY — DX: Unspecified asthma, uncomplicated: J45.909

## 2013-11-15 HISTORY — DX: Dysphagia, unspecified: R13.10

## 2013-11-15 HISTORY — DX: Unspecified convulsions: R56.9

## 2013-11-15 HISTORY — DX: Shortness of breath: R06.02

## 2013-11-15 LAB — ABO/RH: ABO/RH(D): O POS

## 2013-11-15 LAB — CBC WITH DIFFERENTIAL/PLATELET
BASOS PCT: 0 % (ref 0–1)
Basophils Absolute: 0 10*3/uL (ref 0.0–0.1)
EOS ABS: 0 10*3/uL (ref 0.0–0.7)
Eosinophils Relative: 0 % (ref 0–5)
HEMATOCRIT: 41.9 % (ref 36.0–46.0)
HEMOGLOBIN: 14.4 g/dL (ref 12.0–15.0)
LYMPHS ABS: 1.4 10*3/uL (ref 0.7–4.0)
Lymphocytes Relative: 11 % — ABNORMAL LOW (ref 12–46)
MCH: 31.2 pg (ref 26.0–34.0)
MCHC: 34.4 g/dL (ref 30.0–36.0)
MCV: 90.9 fL (ref 78.0–100.0)
MONO ABS: 0.2 10*3/uL (ref 0.1–1.0)
Monocytes Relative: 2 % — ABNORMAL LOW (ref 3–12)
Neutro Abs: 10.8 10*3/uL — ABNORMAL HIGH (ref 1.7–7.7)
Neutrophils Relative %: 87 % — ABNORMAL HIGH (ref 43–77)
Platelets: 334 10*3/uL (ref 150–400)
RBC: 4.61 MIL/uL (ref 3.87–5.11)
RDW: 12.6 % (ref 11.5–15.5)
WBC: 12.4 10*3/uL — ABNORMAL HIGH (ref 4.0–10.5)

## 2013-11-15 LAB — URINALYSIS, ROUTINE W REFLEX MICROSCOPIC
Bilirubin Urine: NEGATIVE
GLUCOSE, UA: NEGATIVE mg/dL
Hgb urine dipstick: NEGATIVE
KETONES UR: 15 mg/dL — AB
NITRITE: NEGATIVE
PH: 5 (ref 5.0–8.0)
PROTEIN: NEGATIVE mg/dL
Specific Gravity, Urine: 1.03 (ref 1.005–1.030)
Urobilinogen, UA: 0.2 mg/dL (ref 0.0–1.0)

## 2013-11-15 LAB — URINE MICROSCOPIC-ADD ON

## 2013-11-15 LAB — BASIC METABOLIC PANEL
BUN: 23 mg/dL (ref 6–23)
CHLORIDE: 101 meq/L (ref 96–112)
CO2: 24 meq/L (ref 19–32)
Calcium: 9.6 mg/dL (ref 8.4–10.5)
Creatinine, Ser: 0.81 mg/dL (ref 0.50–1.10)
GFR calc Af Amer: >90 mL/min (ref >90)
GFR calc non Af Amer: 84 mL/min — ABNORMAL LOW (ref >90)
Glucose, Bld: 123 mg/dL — ABNORMAL HIGH (ref 70–99)
POTASSIUM: 5 meq/L (ref 3.7–5.3)
SODIUM: 138 meq/L (ref 137–147)

## 2013-11-15 LAB — SURGICAL PCR SCREEN
MRSA, PCR: NEGATIVE
Staphylococcus aureus: POSITIVE — AB

## 2013-11-15 LAB — HCG, SERUM, QUALITATIVE: Preg, Serum: NEGATIVE

## 2013-11-15 LAB — TYPE AND SCREEN
ABO/RH(D): O POS
Antibody Screen: NEGATIVE

## 2013-11-15 LAB — APTT: aPTT: 27 seconds (ref 24–37)

## 2013-11-15 LAB — PROTIME-INR
INR: 0.98 (ref 0.00–1.49)
Prothrombin Time: 12.8 seconds (ref 11.6–15.2)

## 2013-11-15 MED ORDER — CHLORHEXIDINE GLUCONATE 4 % EX LIQD: 60.0000 mL | Freq: Once | CUTANEOUS | Status: DC

## 2013-11-15 NOTE — Progress Notes (Addendum)
Pt doesn't have a cardiologist  Denies ever having an echo/stress test/heart cath  Denies EKG or CXR in past yr   Medical Md is Dr.Catherine [employer]ney

## 2013-11-15 NOTE — Pre-Procedure Instructions (Signed)
Robin [name]  11/15/2013   Your procedure is scheduled on:  Fri, April 3 @ 12:15 PM  [facility]  at 10:15 AM.  Call this number if you have problems the morning of surgery: [phone]   Remember:   Do not eat food or drink liquids after midnight.   Take these medicines the morning of surgery with A SIP OF WATER: Albuterol<Bring Your Inhaler With You>,Fluticasone(Flonase),Prednisone(Deltasone),and Tramadol(Ultram-if needed)                No Goody's,BC's,Aleve,Aspirin,Ibuprofen,Fish Oil,or any Herbal Medications   Do not wear jewelry, make-up or nail polish.  Do not wear lotions, powders, or perfumes. You may wear deodorant.  Do not shave 48 hours prior to surgery.   Do not bring valuables to the hospital.  [facility] is not responsible                  for any belongings or valuables.               Contacts, dentures or bridgework may not be worn into surgery.  Leave suitcase in the car. After surgery it may be brought to your room.  For patients admitted to the hospital, discharge time is determined by your                treatment team.                   Special Instructions:  [place] - Preparing for Surgery  Before surgery, you can play an important role.  Because skin is not sterile, your skin needs to be as free of germs as possible.  You can reduce the number of germs on you skin by washing with CHG (chlorahexidine gluconate) soap before surgery.  CHG is an antiseptic cleaner which kills germs and bonds with the skin to continue killing germs even after washing.  Please DO NOT use if you have an allergy to CHG or antibacterial soaps.  If your skin becomes reddened/irritated stop using the CHG and inform your nurse when you arrive at Short Stay.  Do not shave (including legs and underarms) for at least 48 hours prior to the first CHG shower.  You may shave your face.  Please follow these instructions carefully:   1.  Shower with CHG Soap the night  before surgery and the                                morning of Surgery.  2.  If you choose to wash your hair, wash your hair first as usual with your       normal shampoo.  3.  After you shampoo, rinse your hair and body thoroughly to remove the                      Shampoo.  4.  Use CHG as you would any other liquid soap.  You can apply chg directly       to the skin and wash gently with scrungie or a clean washcloth.  5.  Apply the CHG Soap to your body ONLY FROM THE NECK DOWN.        Do not use on open wounds or open sores.  Avoid contact with your eyes,       ears, mouth and genitals (private parts).  Wash genitals (private parts)  with your normal soap.  6.  Wash thoroughly, paying special attention to the area where your surgery        will be performed.  7.  Thoroughly rinse your body with warm water from the neck down.  8.  DO NOT shower/wash with your normal soap after using and rinsing off       the CHG Soap.  9.  Pat yourself dry with a clean towel.            10.  Wear clean pajamas.            11.  Place clean sheets on your bed the night of your first shower and do not        sleep with pets.  Day of Surgery  Do not apply any lotions/deoderants the morning of surgery.  Please wear clean clothes to the hospital/surgery center.     Please read over the following fact sheets that you were given: Pain Booklet, Coughing and Deep Breathing, Blood Transfusion Information, MRSA Information and Surgical Site Infection Prevention

## 2013-11-17 NOTE — Progress Notes (Signed)
Anesthesia Chart Review:  Patient is a 48 year old female scheduled for left THA on 11/24/13 by Dr. Turner Danielsowan.    History includes form46er smoker, COPD, asthma, HLD, dysphagia, nephrolithiasis, arthritis, childhood seizure, lumbar laminectomy, cholecystectomy. BMI is 39.94 consistent with obesity/borderline morbid obesity. PCP is Dr. Nani Gasseratherine Metheney who is aware of plans for surgery. Of note, she was treated for an asthma exacerbation and allergic rhinitis on 11/13/13 and started on a steroid taper.  She was also started or resumed on albuterol, Advair, Singulair, Zyrtec, and fluticasone.    EKG on 11/15/13 showed NSR with sinus arrhythmia.  CXR on 11/15/13 showed: There is no evidence of active cardiopulmonary disease. Mild hyperinflation likely reflects known COPD and/or reactive airway disease.  Preoperative labs noted.   Patient treated for asthma exacerbation on Monday.  She is suppose to let Dr. Linford ArnoldMetheney know if now improvement in 4-5 days.  Dr. Linford ArnoldMetheney is aware of her surgery plans.  Patient appears to be on an appropriate pulmonary regimen.  If respiratory status is back to baseline by surgery then I would anticipate that she could proceed as planned.   Velna Ochsllison Mahina Salatino, PA-C [facility]/Anesthesiology Phone [phone] 11/17/2013 12:03 PM

## 2013-11-23 DIAGNOSIS — M1612 Unilateral primary osteoarthritis, left hip: Secondary | ICD-10-CM | POA: Diagnosis present

## 2013-11-23 MED ORDER — DEXTROSE-NACL 5-0.45 % IV SOLN: INTRAVENOUS | Status: DC

## 2013-11-23 MED ORDER — CEFAZOLIN SODIUM-DEXTROSE 2-3 GM-% IV SOLR
2.0000 g | INTRAVENOUS | Status: AC
  Administered 2013-11-24: 2 g via INTRAVENOUS
  Filled 2013-11-23: qty 50

## 2013-11-23 NOTE — H&P (Signed)
TOTAL HIP ADMISSION H&P  Patient is admitted for left total hip arthroplasty.  Subjective:  Chief Complaint: left hip pain  HPI: Robin [name], 48 y.o. female, has a history of pain and functional disability in the left hip(s) due to arthritis and patient has failed non-surgical conservative treatments for greater than 12 weeks to include NSAID's and/or analgesics, flexibility and strengthening excercises and activity modification.  Onset of symptoms was gradual starting 2 years ago with gradually worsening course since that time.The patient noted no past surgery on the left hip(s).  Patient currently rates pain in the left hip at 10 out of 10 with activity. Patient has night pain, worsening of pain with activity and weight bearing, pain that interfers with activities of daily living and pain with passive range of motion. Patient has evidence of subchondral cysts and developmental hip dysplasia by imaging studies. This condition presents safety issues increasing the risk of falls.  There is no current active infection.  Patient Active Problem List   Diagnosis Date Noted  . Asthma 06/02/2012  . Degenerative arthritis of hip 06/02/2012   Past Medical History  Diagnosis Date  . Hyperlipidemia     borderline no meds required  . Shortness of breath     lying/sitting/exertion  . Asthma     Flonase,Singulair,Advair daily  . COPD (chronic obstructive pulmonary disease)     uses Albuterol daily as needed  . History of bronchitis     last time 50yrs ago  . Convulsion     as a baby   . Arthritis   . Joint pain   . Joint swelling   . Dysphagia   . Kidney stone     has been there for a long time but no problems    Past Surgical History  Procedure Laterality Date  . Lumbar laminectomy  1994    L4-5  . Cholecystectomy  2002  . Ercp      No prescriptions prior to admission   No Known Allergies  History  Substance Use Topics  . Smoking status: Former Games developer  . Smokeless tobacco: Not on  file     Comment: quit 8-45yrs ago  . Alcohol Use: No    Family History  Problem Relation Age of Onset  . Alcohol abuse Brother   . Alcohol abuse Sister   . Heart attack Father   . Diabetes      grandparents.      Review of Systems  Constitutional: Negative.   HENT: Negative.   Eyes: Negative.   Respiratory: Negative.   Cardiovascular: Negative.   Gastrointestinal: Negative.   Genitourinary: Negative.   Musculoskeletal: Positive for joint pain.  Skin: Negative.   Neurological: Negative.   Endo/Heme/Allergies: Negative.   Psychiatric/Behavioral: Negative.     Objective:  Physical Exam  Constitutional: She is oriented to person, place, and time. She appears well-developed and well-nourished.  HENT:  Head: Normocephalic and atraumatic.  Eyes: Pupils are equal, round, and reactive to light.  Neck: Normal range of motion. Neck supple.  Cardiovascular: Intact distal pulses.   Respiratory: Effort normal.  Musculoskeletal:  Both hips have significant pain with internal rotation blocks at about 20 bilaterally, external rotation is less bothersome.  She is neurovascularly intact.  Her toes are well-perfused normal sensation to her feet.   Neurological: She is alert and oriented to person, place, and time.  Skin: Skin is warm and dry.  Psychiatric: She has a normal mood and affect. Her behavior is normal. Judgment  and thought content normal.    Vital signs in last 24 hours:    Labs:   Estimated body mass index is 39.68 kg/(m^2) as calculated from the following:   Height as of 07/04/12: 5\' 2"  (1.575 m).   Weight as of 08/18/13: 98.431 kg (217 lb).   Imaging Review Radiographs:  X-rays were ordered, performed, and interpreted by me today included; AP pelvis and crosstable lateral show bilateral neck shaft angles approaching 150 with relatively shallow acetabula he and no superior articular cartilage.  There are subchondral cysts in the Sorceil of the  acetabulum.  Assessment/Plan:  End stage arthritis, left hip(s)  The patient history, physical examination, clinical judgement of the provider and imaging studies are consistent with end stage degenerative joint disease of the left hip(s) and total hip arthroplasty is deemed medically necessary. The treatment options including medical management, injection therapy, arthroscopy and arthroplasty were discussed at length. The risks and benefits of total hip arthroplasty were presented and reviewed. The risks due to aseptic loosening, infection, stiffness, dislocation/subluxation,  thromboembolic complications and other imponderables were discussed.  The patient acknowledged the explanation, agreed to proceed with the plan and consent was signed. Patient is being admitted for inpatient treatment for surgery, pain control, PT, OT, prophylactic antibiotics, VTE prophylaxis, progressive ambulation and ADL's and discharge planning.The patient is planning to be discharged home with home health services

## 2013-11-24 ENCOUNTER — Encounter (HOSPITAL_COMMUNITY)
Admission: RE | Disposition: A | Discharge status: Home Health | Payer: Self-pay | Source: Ambulatory Visit | Attending: Orthopedic Surgery

## 2013-11-24 ENCOUNTER — Encounter (HOSPITAL_COMMUNITY): Payer: BC Managed Care – PPO | Admitting: Vascular Surgery

## 2013-11-24 ENCOUNTER — Inpatient Hospital Stay (HOSPITAL_COMMUNITY): Payer: BC Managed Care – PPO

## 2013-11-24 ENCOUNTER — Inpatient Hospital Stay (HOSPITAL_COMMUNITY): Payer: BC Managed Care – PPO | Admitting: Anesthesiology

## 2013-11-24 ENCOUNTER — Encounter (HOSPITAL_COMMUNITY): Payer: Self-pay | Admitting: Anesthesiology

## 2013-11-24 ENCOUNTER — Inpatient Hospital Stay (HOSPITAL_COMMUNITY)
Admission: RE | Admit: 2013-11-24 | Discharge: 2013-11-26 | DRG: 470 | Disposition: A | Discharge status: Home Health | Payer: BC Managed Care – PPO | Source: Ambulatory Visit | Attending: Orthopedic Surgery | Admitting: Orthopedic Surgery

## 2013-11-24 DIAGNOSIS — M169 Osteoarthritis of hip, unspecified: Principal | ICD-10-CM | POA: Diagnosis present

## 2013-11-24 DIAGNOSIS — J449 Chronic obstructive pulmonary disease, unspecified: Secondary | ICD-10-CM | POA: Diagnosis present

## 2013-11-24 DIAGNOSIS — E785 Hyperlipidemia, unspecified: Secondary | ICD-10-CM | POA: Diagnosis present

## 2013-11-24 DIAGNOSIS — Z23 Encounter for immunization: Secondary | ICD-10-CM

## 2013-11-24 DIAGNOSIS — J4489 Other specified chronic obstructive pulmonary disease: Secondary | ICD-10-CM | POA: Diagnosis present

## 2013-11-24 DIAGNOSIS — M1610 Unilateral primary osteoarthritis, unspecified hip: Principal | ICD-10-CM | POA: Diagnosis present

## 2013-11-24 DIAGNOSIS — M1612 Unilateral primary osteoarthritis, left hip: Secondary | ICD-10-CM

## 2013-11-24 DIAGNOSIS — Z87891 Personal history of nicotine dependence: Secondary | ICD-10-CM

## 2013-11-24 DIAGNOSIS — R569 Unspecified convulsions: Secondary | ICD-10-CM | POA: Diagnosis present

## 2013-11-24 DIAGNOSIS — Z87442 Personal history of urinary calculi: Secondary | ICD-10-CM

## 2013-11-24 DIAGNOSIS — N289 Disorder of kidney and ureter, unspecified: Secondary | ICD-10-CM | POA: Diagnosis present

## 2013-11-24 HISTORY — PX: TOTAL HIP ARTHROPLASTY: SHX124

## 2013-11-24 SURGERY — ARTHROPLASTY, HIP, TOTAL,POSTERIOR APPROACH
Anesthesia: Monitor Anesthesia Care | Site: Hip | Laterality: Left

## 2013-11-24 MED ORDER — SENNOSIDES-DOCUSATE SODIUM 8.6-50 MG PO TABS: 1.0000 | ORAL_TABLET | Freq: Every evening | ORAL | Status: DC | PRN

## 2013-11-24 MED ORDER — METHOCARBAMOL 100 MG/ML IJ SOLN
500.0000 mg | Freq: Four times a day (QID) | INTRAVENOUS | Status: DC | PRN
  Filled 2013-11-24: qty 5

## 2013-11-24 MED ORDER — HYDROMORPHONE HCL PF 1 MG/ML IJ SOLN
0.5000 mg | INTRAMUSCULAR | Status: DC | PRN
  Administered 2013-11-24: 1 mg via INTRAVENOUS
  Filled 2013-11-24: qty 1

## 2013-11-24 MED ORDER — ROCURONIUM BROMIDE 100 MG/10ML IV SOLN
INTRAVENOUS | Status: DC | PRN
  Administered 2013-11-24 (×3): 10 mg via INTRAVENOUS
  Administered 2013-11-24: 50 mg via INTRAVENOUS

## 2013-11-24 MED ORDER — MOMETASONE FURO-FORMOTEROL FUM 100-5 MCG/ACT IN AERO
2.0000 | INHALATION_SPRAY | Freq: Two times a day (BID) | RESPIRATORY_TRACT | Status: DC
  Administered 2013-11-24 – 2013-11-26 (×4): 2 via RESPIRATORY_TRACT
  Filled 2013-11-24: qty 8.8

## 2013-11-24 MED ORDER — 0.9 % SODIUM CHLORIDE (POUR BTL) OPTIME
TOPICAL | Status: DC | PRN
  Administered 2013-11-24: 1000 mL

## 2013-11-24 MED ORDER — OXYCODONE HCL 5 MG PO TABS
ORAL_TABLET | ORAL | Status: AC
  Administered 2013-11-24: 10 mg via ORAL
  Filled 2013-11-24: qty 2

## 2013-11-24 MED ORDER — GLYCOPYRROLATE 0.2 MG/ML IJ SOLN
INTRAMUSCULAR | Status: AC
  Filled 2013-11-24: qty 4

## 2013-11-24 MED ORDER — BUPIVACAINE-EPINEPHRINE 0.5% -1:200000 IJ SOLN
INTRAMUSCULAR | Status: DC | PRN
  Administered 2013-11-24: 19 mL

## 2013-11-24 MED ORDER — HYDROMORPHONE HCL PF 1 MG/ML IJ SOLN
INTRAMUSCULAR | Status: AC
  Administered 2013-11-24: 0.5 mg via INTRAVENOUS
  Filled 2013-11-24: qty 1

## 2013-11-24 MED ORDER — ROCURONIUM BROMIDE 50 MG/5ML IV SOLN
INTRAVENOUS | Status: AC
  Filled 2013-11-24: qty 1

## 2013-11-24 MED ORDER — MENTHOL 3 MG MT LOZG: 1.0000 | LOZENGE | OROMUCOSAL | Status: DC | PRN

## 2013-11-24 MED ORDER — METHOCARBAMOL 500 MG PO TABS
500.0000 mg | ORAL_TABLET | Freq: Four times a day (QID) | ORAL | Status: DC | PRN
  Administered 2013-11-24 – 2013-11-26 (×7): 500 mg via ORAL
  Filled 2013-11-24 (×6): qty 1

## 2013-11-24 MED ORDER — ACETAMINOPHEN 325 MG PO TABS
650.0000 mg | ORAL_TABLET | Freq: Four times a day (QID) | ORAL | Status: DC | PRN
  Administered 2013-11-25: 650 mg via ORAL
  Filled 2013-11-24: qty 2

## 2013-11-24 MED ORDER — LIDOCAINE HCL (CARDIAC) 20 MG/ML IV SOLN
INTRAVENOUS | Status: DC | PRN
  Administered 2013-11-24: 60 mg via INTRAVENOUS

## 2013-11-24 MED ORDER — PHENOL 1.4 % MT LIQD: 1.0000 | OROMUCOSAL | Status: DC | PRN

## 2013-11-24 MED ORDER — PROPOFOL 10 MG/ML IV BOLUS
INTRAVENOUS | Status: AC
  Filled 2013-11-24: qty 20

## 2013-11-24 MED ORDER — NEOSTIGMINE METHYLSULFATE 1 MG/ML IJ SOLN
INTRAMUSCULAR | Status: AC
  Filled 2013-11-24: qty 10

## 2013-11-24 MED ORDER — HYDROMORPHONE HCL PF 1 MG/ML IJ SOLN
INTRAMUSCULAR | Status: AC
  Administered 2013-11-24: 0.5 mg via INTRAVENOUS
  Filled 2013-11-24: qty 2

## 2013-11-24 MED ORDER — METHOCARBAMOL 500 MG PO TABS
ORAL_TABLET | ORAL | Status: AC
  Administered 2013-11-24: 500 mg via ORAL
  Filled 2013-11-24: qty 1

## 2013-11-24 MED ORDER — LACTATED RINGERS IV SOLN
INTRAVENOUS | Status: DC
  Administered 2013-11-24 (×2): via INTRAVENOUS

## 2013-11-24 MED ORDER — FENTANYL CITRATE 0.05 MG/ML IJ SOLN
INTRAMUSCULAR | Status: DC | PRN
  Administered 2013-11-24: 50 ug via INTRAVENOUS
  Administered 2013-11-24: 100 ug via INTRAVENOUS
  Administered 2013-11-24 (×4): 50 ug via INTRAVENOUS
  Administered 2013-11-24: 100 ug via INTRAVENOUS
  Administered 2013-11-24: 50 ug via INTRAVENOUS

## 2013-11-24 MED ORDER — ONDANSETRON HCL 4 MG/2ML IJ SOLN
INTRAMUSCULAR | Status: DC | PRN
  Administered 2013-11-24: 4 mg via INTRAVENOUS

## 2013-11-24 MED ORDER — MONTELUKAST SODIUM 10 MG PO TABS
10.0000 mg | ORAL_TABLET | Freq: Every day | ORAL | Status: DC
  Administered 2013-11-24 – 2013-11-25 (×2): 10 mg via ORAL
  Filled 2013-11-24 (×3): qty 1

## 2013-11-24 MED ORDER — FLEET ENEMA 7-19 GM/118ML RE ENEM: 1.0000 | ENEMA | Freq: Once | RECTAL | Status: AC | PRN

## 2013-11-24 MED ORDER — TRAMADOL HCL 50 MG PO TABS
50.0000 mg | ORAL_TABLET | Freq: Four times a day (QID) | ORAL | Status: DC
  Administered 2013-11-24 – 2013-11-25 (×5): 50 mg via ORAL
  Filled 2013-11-24 (×6): qty 1

## 2013-11-24 MED ORDER — TRANEXAMIC ACID 100 MG/ML IV SOLN
0.9000 mg | INTRAVENOUS | Status: DC | PRN
  Administered 2013-11-24: 1000 mg via INTRAVENOUS

## 2013-11-24 MED ORDER — PROPOFOL 10 MG/ML IV BOLUS
INTRAVENOUS | Status: DC | PRN
  Administered 2013-11-24: 200 mg via INTRAVENOUS
  Administered 2013-11-24: 40 mg via INTRAVENOUS

## 2013-11-24 MED ORDER — LIDOCAINE HCL (CARDIAC) 20 MG/ML IV SOLN
INTRAVENOUS | Status: AC
  Filled 2013-11-24: qty 5

## 2013-11-24 MED ORDER — ARTIFICIAL TEARS OP OINT
TOPICAL_OINTMENT | OPHTHALMIC | Status: AC
  Filled 2013-11-24: qty 3.5

## 2013-11-24 MED ORDER — TRANEXAMIC ACID 100 MG/ML IV SOLN: 1000.0000 mg | INTRAVENOUS | Status: DC

## 2013-11-24 MED ORDER — ONDANSETRON HCL 4 MG/2ML IJ SOLN: 4.0000 mg | Freq: Four times a day (QID) | INTRAMUSCULAR | Status: DC | PRN

## 2013-11-24 MED ORDER — GLYCOPYRROLATE 0.2 MG/ML IJ SOLN
INTRAMUSCULAR | Status: DC | PRN
  Administered 2013-11-24: 0.6 mg via INTRAVENOUS

## 2013-11-24 MED ORDER — FENTANYL CITRATE 0.05 MG/ML IJ SOLN
INTRAMUSCULAR | Status: AC
  Filled 2013-11-24: qty 5

## 2013-11-24 MED ORDER — FLUTICASONE PROPIONATE 50 MCG/ACT NA SUSP
2.0000 | Freq: Every day | NASAL | Status: DC
  Administered 2013-11-24 – 2013-11-26 (×3): 2 via NASAL
  Filled 2013-11-24: qty 16

## 2013-11-24 MED ORDER — ASPIRIN EC 325 MG PO TBEC
325.0000 mg | DELAYED_RELEASE_TABLET | Freq: Every day | ORAL | Status: DC
  Administered 2013-11-25 – 2013-11-26 (×2): 325 mg via ORAL
  Filled 2013-11-24 (×3): qty 1

## 2013-11-24 MED ORDER — OXYCODONE HCL 5 MG PO TABS
5.0000 mg | ORAL_TABLET | ORAL | Status: DC | PRN
  Administered 2013-11-24 – 2013-11-26 (×11): 10 mg via ORAL
  Filled 2013-11-24 (×10): qty 2

## 2013-11-24 MED ORDER — ONDANSETRON HCL 4 MG PO TABS: 4.0000 mg | ORAL_TABLET | Freq: Four times a day (QID) | ORAL | Status: DC | PRN

## 2013-11-24 MED ORDER — TRANEXAMIC ACID 100 MG/ML IV SOLN
1000.0000 mg | INTRAVENOUS | Status: DC
  Filled 2013-11-24: qty 10

## 2013-11-24 MED ORDER — BUPIVACAINE-EPINEPHRINE (PF) 0.5% -1:200000 IJ SOLN
INTRAMUSCULAR | Status: AC
  Filled 2013-11-24: qty 10

## 2013-11-24 MED ORDER — ONDANSETRON HCL 4 MG/2ML IJ SOLN
INTRAMUSCULAR | Status: AC
  Filled 2013-11-24: qty 2

## 2013-11-24 MED ORDER — METOCLOPRAMIDE HCL 5 MG/ML IJ SOLN: 5.0000 mg | Freq: Three times a day (TID) | INTRAMUSCULAR | Status: DC | PRN

## 2013-11-24 MED ORDER — ALBUTEROL SULFATE HFA 108 (90 BASE) MCG/ACT IN AERS
INHALATION_SPRAY | RESPIRATORY_TRACT | Status: DC | PRN
  Administered 2013-11-24 (×2): 3 via RESPIRATORY_TRACT

## 2013-11-24 MED ORDER — DOCUSATE SODIUM 100 MG PO CAPS
100.0000 mg | ORAL_CAPSULE | Freq: Two times a day (BID) | ORAL | Status: DC
  Administered 2013-11-24 – 2013-11-26 (×4): 100 mg via ORAL
  Filled 2013-11-24 (×6): qty 1

## 2013-11-24 MED ORDER — ACETAMINOPHEN 650 MG RE SUPP: 650.0000 mg | Freq: Four times a day (QID) | RECTAL | Status: DC | PRN

## 2013-11-24 MED ORDER — MIDAZOLAM HCL 2 MG/2ML IJ SOLN
INTRAMUSCULAR | Status: AC
  Filled 2013-11-24: qty 2

## 2013-11-24 MED ORDER — DIPHENHYDRAMINE HCL 12.5 MG/5ML PO ELIX: 12.5000 mg | ORAL_SOLUTION | ORAL | Status: DC | PRN

## 2013-11-24 MED ORDER — PNEUMOCOCCAL VAC POLYVALENT 25 MCG/0.5ML IJ INJ
0.5000 mL | INJECTION | INTRAMUSCULAR | Status: AC
  Administered 2013-11-25: 0.5 mL via INTRAMUSCULAR
  Filled 2013-11-24: qty 0.5

## 2013-11-24 MED ORDER — HYDROMORPHONE HCL PF 1 MG/ML IJ SOLN
0.2500 mg | INTRAMUSCULAR | Status: DC | PRN
  Administered 2013-11-24 (×6): 0.5 mg via INTRAVENOUS

## 2013-11-24 MED ORDER — ALUMINUM HYDROXIDE GEL 320 MG/5ML PO SUSP
15.0000 mL | ORAL | Status: DC | PRN
  Filled 2013-11-24: qty 30

## 2013-11-24 MED ORDER — KCL IN DEXTROSE-NACL 20-5-0.45 MEQ/L-%-% IV SOLN
INTRAVENOUS | Status: DC
  Administered 2013-11-24 – 2013-11-25 (×3): via INTRAVENOUS
  Filled 2013-11-24 (×8): qty 1000

## 2013-11-24 MED ORDER — METOCLOPRAMIDE HCL 10 MG PO TABS: 5.0000 mg | ORAL_TABLET | Freq: Three times a day (TID) | ORAL | Status: DC | PRN

## 2013-11-24 MED ORDER — BISACODYL 5 MG PO TBEC: 5.0000 mg | DELAYED_RELEASE_TABLET | Freq: Every day | ORAL | Status: DC | PRN

## 2013-11-24 MED ORDER — PREDNISONE 20 MG PO TABS
40.0000 mg | ORAL_TABLET | Freq: Every day | ORAL | Status: DC
  Administered 2013-11-24 – 2013-11-26 (×2): 40 mg via ORAL
  Filled 2013-11-24 (×3): qty 2

## 2013-11-24 MED ORDER — ALBUTEROL SULFATE (2.5 MG/3ML) 0.083% IN NEBU: 2.5000 mg | INHALATION_SOLUTION | Freq: Four times a day (QID) | RESPIRATORY_TRACT | Status: DC | PRN

## 2013-11-24 MED ORDER — MIDAZOLAM HCL 5 MG/5ML IJ SOLN
INTRAMUSCULAR | Status: DC | PRN
  Administered 2013-11-24: 2 mg via INTRAVENOUS

## 2013-11-24 MED ORDER — NEOSTIGMINE METHYLSULFATE 1 MG/ML IJ SOLN
INTRAMUSCULAR | Status: DC | PRN
  Administered 2013-11-24: 4 mg via INTRAVENOUS

## 2013-11-24 SURGICAL SUPPLY — 57 items
BLADE SAW SGTL 18X1.27X75 (BLADE) ×2 IMPLANT
BLADE SAW SGTL 18X1.27X75MM (BLADE) ×1
BRUSH FEMORAL CANAL (MISCELLANEOUS) IMPLANT
CAPT HIP PF COP ×3 IMPLANT
COVER BACK TABLE 24X17X13 BIG (DRAPES) IMPLANT
COVER SURGICAL LIGHT HANDLE (MISCELLANEOUS) ×3 IMPLANT
DRAPE ORTHO SPLIT 77X108 STRL (DRAPES) ×4
DRAPE PROXIMA HALF (DRAPES) ×3 IMPLANT
DRAPE SURG ORHT 6 SPLT 77X108 (DRAPES) ×2 IMPLANT
DRAPE U-SHAPE 47X51 STRL (DRAPES) ×3 IMPLANT
DRILL BIT 7/64X5 (BIT) ×3 IMPLANT
DRSG AQUACEL AG ADV 3.5X10 (GAUZE/BANDAGES/DRESSINGS) ×6 IMPLANT
DURAPREP 26ML APPLICATOR (WOUND CARE) ×3 IMPLANT
ELECT BLADE 4.0 EZ CLEAN MEGAD (MISCELLANEOUS) ×3
ELECT REM PT RETURN 9FT ADLT (ELECTROSURGICAL) ×3
ELECTRODE BLDE 4.0 EZ CLN MEGD (MISCELLANEOUS) ×1 IMPLANT
ELECTRODE REM PT RTRN 9FT ADLT (ELECTROSURGICAL) ×1 IMPLANT
GAUZE XEROFORM 1X8 LF (GAUZE/BANDAGES/DRESSINGS) IMPLANT
GLOVE BIO SURGEON STRL SZ7.5 (GLOVE) ×3 IMPLANT
GLOVE BIO SURGEON STRL SZ8.5 (GLOVE) ×6 IMPLANT
GLOVE BIOGEL PI IND STRL 8 (GLOVE) ×2 IMPLANT
GLOVE BIOGEL PI IND STRL 9 (GLOVE) ×1 IMPLANT
GLOVE BIOGEL PI INDICATOR 8 (GLOVE) ×4
GLOVE BIOGEL PI INDICATOR 9 (GLOVE) ×2
GLOVE ECLIPSE 6.5 STRL STRAW (GLOVE) ×6 IMPLANT
GOWN STRL REUS W/ TWL LRG LVL3 (GOWN DISPOSABLE) ×2 IMPLANT
GOWN STRL REUS W/ TWL XL LVL3 (GOWN DISPOSABLE) ×3 IMPLANT
GOWN STRL REUS W/TWL LRG LVL3 (GOWN DISPOSABLE) ×4
GOWN STRL REUS W/TWL XL LVL3 (GOWN DISPOSABLE) ×6
HANDPIECE INTERPULSE COAX TIP (DISPOSABLE)
HOOD PEEL AWAY FACE SHEILD DIS (HOOD) ×6 IMPLANT
KIT BASIN OR (CUSTOM PROCEDURE TRAY) ×3 IMPLANT
KIT ROOM TURNOVER OR (KITS) ×3 IMPLANT
MANIFOLD NEPTUNE II (INSTRUMENTS) ×3 IMPLANT
NEEDLE 22X1 1/2 (OR ONLY) (NEEDLE) ×3 IMPLANT
NS IRRIG 1000ML POUR BTL (IV SOLUTION) ×3 IMPLANT
PACK TOTAL JOINT (CUSTOM PROCEDURE TRAY) ×3 IMPLANT
PAD ARMBOARD 7.5X6 YLW CONV (MISCELLANEOUS) ×6 IMPLANT
PASSER SUT SWANSON 36MM LOOP (INSTRUMENTS) ×3 IMPLANT
PRESSURIZER FEMORAL UNIV (MISCELLANEOUS) IMPLANT
SET HNDPC FAN SPRY TIP SCT (DISPOSABLE) IMPLANT
SUT ETHIBOND 2 V 37 (SUTURE) ×3 IMPLANT
SUT ETHILON 3 0 FSL (SUTURE) ×3 IMPLANT
SUT VIC AB 0 CT1 27 (SUTURE) ×2
SUT VIC AB 0 CT1 27XBRD ANBCTR (SUTURE) ×1 IMPLANT
SUT VIC AB 0 CTB1 27 (SUTURE) ×3 IMPLANT
SUT VIC AB 0 CTX 36 (SUTURE)
SUT VIC AB 0 CTX36XBRD ANTBCTR (SUTURE) IMPLANT
SUT VIC AB 1 CTX 36 (SUTURE) ×4
SUT VIC AB 1 CTX36XBRD ANBCTR (SUTURE) ×2 IMPLANT
SUT VIC AB 2-0 CTB1 (SUTURE) ×3 IMPLANT
SYR CONTROL 10ML LL (SYRINGE) ×3 IMPLANT
TOWEL OR 17X24 6PK STRL BLUE (TOWEL DISPOSABLE) ×3 IMPLANT
TOWEL OR 17X26 10 PK STRL BLUE (TOWEL DISPOSABLE) ×3 IMPLANT
TOWER CARTRIDGE SMART MIX (DISPOSABLE) IMPLANT
TRAY FOLEY CATH 14FR (SET/KITS/TRAYS/PACK) IMPLANT
WATER STERILE IRR 1000ML POUR (IV SOLUTION) IMPLANT

## 2013-11-24 NOTE — Anesthesia Procedure Notes (Signed)
Procedure Name: Intubation Date/Time: 11/24/2013 12:00 PM Performed by: Jerilee HohMUMM, Areya Lemmerman N Pre-anesthesia Checklist: Patient identified, Emergency Drugs available, Suction available and Patient being monitored Patient Re-evaluated:Patient Re-evaluated prior to inductionOxygen Delivery Method: Circle system utilized Preoxygenation: Pre-oxygenation with 100% oxygen Intubation Type: IV induction Ventilation: Mask ventilation without difficulty Laryngoscope Size: 3 Grade View: Grade I Tube type: Oral Tube size: 7.0 mm Number of attempts: 1 Airway Equipment and Method: Stylet Placement Confirmation: ETT inserted through vocal cords under direct vision,  positive ETCO2 and breath sounds checked- equal and bilateral Secured at: 22 cm Tube secured with: Tape Dental Injury: Teeth and Oropharynx as per pre-operative assessment

## 2013-11-24 NOTE — Anesthesia Preprocedure Evaluation (Addendum)
Anesthesia Evaluation  Patient identified by MRN, date of birth, ID band Patient awake and Patient confused    Reviewed: Allergy & Precautions, H&P , NPO status , Patient's Chart, lab work & pertinent test results  Airway Mallampati: I TM Distance: >3 FB Neck ROM: Full    Dental  (+) Teeth Intact, Dental Advisory Given   Pulmonary shortness of breath and with exertion, asthma , COPD COPD inhaler, former smoker,  breath sounds clear to auscultation        Cardiovascular + DOE Rhythm:Regular Rate:Normal     Neuro/Psych Seizures -, Well Controlled,     GI/Hepatic   Endo/Other    Renal/GU Renal disease     Musculoskeletal  (+) Arthritis -, Osteoarthritis and on steriods ,    Abdominal   Peds  Hematology   Anesthesia Other Findings Seizure as a child Last asthma exacerbation 2 weeks ago Off Prednisone 3-5 days per pt. Pt. States food "sticks and won't go down sometimes." Kidney Stones  Reproductive/Obstetrics                         Anesthesia Physical Anesthesia Plan  ASA: III  Anesthesia Plan: MAC   Post-op Pain Management:    Induction: Intravenous  Airway Management Planned: Oral ETT  Additional Equipment:   Intra-op Plan:   Post-operative Plan: Extubation in OR  Informed Consent: I have reviewed the patients History and Physical, chart, labs and discussed the procedure including the risks, benefits and alternatives for the proposed anesthesia with the patient or authorized representative who has indicated his/her understanding and acceptance.   Dental advisory given  Plan Discussed with: CRNA, Anesthesiologist and Surgeon  Anesthesia Plan Comments:         Anesthesia Quick Evaluation

## 2013-11-24 NOTE — Op Note (Signed)
OPERATIVE REPORT    DATE OF PROCEDURE:  11/24/2013       PREOPERATIVE DIAGNOSIS:  OA DEGENERATIVE HIP DISEASE                                                          POSTOPERATIVE DIAGNOSIS:  OA DEGENERATIVE HIP DISEASE                                                           PROCEDURE:  L total hip arthroplasty using a 52 mm DePuy Pinnacle  Cup, Peabody Energypex Hole Eliminator, 10-degree polyethylene liner index superior  and posterior, a +0 36 mm ceramic head, a 18x13x42x160 SROM stem, 18Dsm Sleeve   SURGEON: Lettie Czarnecki J    ASSISTANT:   Eric K. Gaylene BrooksPhillips PA-C  (present throughout entire procedure and necessary for timely completion of the procedure)   ANESTHESIA: General BLOOD LOSS: 400 FLUID REPLACEMENT: 2000 crystalloid COMPLICATIONS: none    INDICATIONS FOR PROCEDURE: A 48 y.o. year-old With  OA DEGENERATIVE HIP DISEASE, DDH for 3 years, x-rays show bone-on-bone arthritic changes. Despite conservative measures with observation, anti-inflammatory medicine, narcotics, use of a cane, has severe unremitting pain and can ambulate only a few blocks before resting.  Patient desires elective L total hip arthroplasty to decrease pain and increase function. The risks, benefits, and alternatives were discussed at length including but not limited to the risks of infection, bleeding, nerve injury, stiffness, blood clots, the need for revision surgery, cardiopulmonary complications, among others, and they were willing to proceed. Questions answered     PROCEDURE IN DETAIL: The patient was identified by armband,  [facility] preoperative [facility], taken to the operating room , appropriate anesthetic monitors  were attached and general endotracheal anesthesia induced. Foley catheter was inserted. Pt was rolled into the R lateral decubitus position and fixed there with a Stulberg Mark II pelvic clamp.  The L lower extremity was then prepped and draped  in the  usual sterile fashion from the ankle to the hemipelvis. A time-out  procedure was performed. The skin along the lateral hip and thigh  infiltrated with 10 mL of 0.5% Marcaine and epinephrine solution. We  then made a posterolateral approach to the hip. With a #10 blade, a 20 cm  incision was made through the skin and subcutaneous tissue down to the level of the  IT band. Small bleeders were identified and cauterized. The IT band was cut in  line with skin incision exposing the greater trochanter. A Cobra retractor was placed between the gluteus minimus and the superior hip joint capsule, and a spiked Cobra between the quadratus femoris and the inferior hip joint capsule. This isolated the short  external rotators and piriformis tendons. These were tagged with a #2 Ethibond  suture and cut off their insertion on the intertrochanteric crest. The posterior  capsule was then developed into an acetabular-based flap from Posterior Superior off of the acetabulum out over the femoral neck and back posterior inferior to the acetabular rim. This flap was tagged with two #2 Ethibond sutures and retracted protecting  the sciatic nerve. This exposed the arthritic femoral head and osteophytes. The hip was then flexed and internally rotated, dislocating the femoral head and a standard neck cut performed 1 fingerbreadth above the lesser trochanter.  A spiked Cobra was placed in the cotyloid notch and a Hohmann retractor was then used to lever the femur anteriorly off of the anterior pelvic column. A posterior-inferior wing retractor was placed at the junction of the acetabulum and the ischium completing the acetabular exposure.We then removed the peripheral osteophytes and labrum from the acetabulum. We then reamed the acetabulum up to 51 mm with basket reamers obtaining good coverage in all quadrants. We then irrigated with normal  saline solution and hammered into place a 52 mm pinnacle cup in 45  degrees of abduction  and about 20 degrees of anteversion. More  peripheral osteophytes removed and a trial 10-degree liner placed with the  index superior-posterior. The hip was then flexed and internally rotated exposing the  proximal femur, which was entered with the initiating reamer followed by  the axial reamers up to a 13.5 mm full depth and 14mm partial depth. We then conically reamed to 18D to the correct depth for a 42 base neck. The calcar was milled to 18Dsm. A trial cone and stem was inserted in the 25 degrees anteversion, with a +0 36mm trial head. Trial reduction was then performed and excellent stability was noted with at 90 of flexion with 75 of internal rotation and then full extension with maximal external rotation. The hip could not be dislocated in full extension. The knee could easily flex  to about 130 degrees. We also stretched the abductors at this point,  because of the preexisting adductor contractures. All trial components  were then removed. The acetabulum was irrigated out with normal saline  solution. A titanium Apex [facility] was then screwed into place  followed by a 10-degree polyethylene liner index superior-posterior. On  the femoral side a 18Dsm ZTT1 sleeve was hammered into place, followed by a 18x13x160x42 SROM stem in 25 degrees of anteversion. At this point, a +0 36 mm ceramic head was  hammered on the stem. The hip was reduced. We checked our stability  one more time and found it to be excellent. The wound was once again  thoroughly irrigated out with normal saline solution pulse lavage. The  capsular flap and short external rotators were repaired back to the  intertrochanteric crest through drill holes with a #2 Ethibond suture.  The IT band was closed with running 1 Vicryl suture. The subcutaneous  tissue with 0 and 2-0 undyed Vicryl suture and the skin with running  interlocking 3-0 nylon suture. Dressing of Xeroform and Mepilex was  then applied. The patient was then  unclamped, rolled supine, awaken extubated and taken to recovery room without difficulty in stable condition.   Ciana Simmon J 11/24/2013, 1:14 PM

## 2013-11-24 NOTE — Interval H&P Note (Signed)
History and Physical Interval Note:  11/24/2013 10:22 AM  Robin [name]  has presented today for surgery, with the diagnosis of OA DEGENERATIVE HIP DISEASE  The various methods of treatment have been discussed with the patient and family. After consideration of risks, benefits and other options for treatment, the patient has consented to  Procedure(s): LEFT TOTAL HIP ARTHROPLASTY (Left) as a surgical intervention .  The patient's history has been reviewed, patient examined, no change in status, stable for surgery.  I have reviewed the patient's chart and labs.  Questions were answered to the patient's satisfaction.     Nestor LewandowskyOWAN,Ismelda Weatherman J

## 2013-11-24 NOTE — Anesthesia Postprocedure Evaluation (Signed)
  Anesthesia Post-op Note  Patient: [name]  Procedure(s) Performed: Procedure(s): LEFT TOTAL HIP ARTHROPLASTY (Left)  Patient Location: PACU  Anesthesia Type:General  Level of Consciousness: awake  Airway and Oxygen Therapy: Patient Spontanous Breathing  Post-op Pain: mild  Post-op Assessment: Post-op Vital signs reviewed  Post-op Vital Signs: Reviewed  Complications: No apparent anesthesia complications

## 2013-11-24 NOTE — Transfer of Care (Signed)
Immediate Anesthesia Transfer of Care Note  Patient: Robin [name]  Procedure(s) Performed: Procedure(s): LEFT TOTAL HIP ARTHROPLASTY (Left)  Patient Location: PACU  Anesthesia Type:General  Level of Consciousness: awake, alert  and oriented  Airway & Oxygen Therapy: Patient Spontanous Breathing and Patient connected to nasal cannula oxygen  Post-op Assessment: Report given to PACU RN and Post -op Vital signs reviewed and stable  Post vital signs: Reviewed and stable  Complications: No apparent anesthesia complications

## 2013-11-25 LAB — CBC
HCT: 36.8 % (ref 36.0–46.0)
HEMOGLOBIN: 12.1 g/dL (ref 12.0–15.0)
MCH: 30.3 pg (ref 26.0–34.0)
MCHC: 32.9 g/dL (ref 30.0–36.0)
MCV: 92 fL (ref 78.0–100.0)
Platelets: 273 10*3/uL (ref 150–400)
RBC: 4 MIL/uL (ref 3.87–5.11)
RDW: 12.4 % (ref 11.5–15.5)
WBC: 13.7 10*3/uL — AB (ref 4.0–10.5)

## 2013-11-25 LAB — BASIC METABOLIC PANEL
BUN: 10 mg/dL (ref 6–23)
CHLORIDE: 100 meq/L (ref 96–112)
CO2: 24 mEq/L (ref 19–32)
CREATININE: 0.68 mg/dL (ref 0.50–1.10)
Calcium: 8.6 mg/dL (ref 8.4–10.5)
GFR calc non Af Amer: >90 mL/min (ref >90)
Glucose, Bld: 144 mg/dL — ABNORMAL HIGH (ref 70–99)
POTASSIUM: 4.5 meq/L (ref 3.7–5.3)
Sodium: 138 mEq/L (ref 137–147)

## 2013-11-25 MED ORDER — OXYCODONE-ACETAMINOPHEN 5-325 MG PO TABS: 1.0000 | ORAL_TABLET | ORAL | Status: DC | PRN

## 2013-11-25 MED ORDER — ASPIRIN EC 325 MG PO TBEC: 325.0000 mg | DELAYED_RELEASE_TABLET | Freq: Two times a day (BID) | ORAL | Status: DC

## 2013-11-25 MED ORDER — METHOCARBAMOL 500 MG PO TABS: 500.0000 mg | ORAL_TABLET | Freq: Two times a day (BID) | ORAL | Status: DC

## 2013-11-25 NOTE — Plan of Care (Signed)
Problem: Consults Goal: Diagnosis- Total Joint Replacement Primary Total Hip     

## 2013-11-25 NOTE — Evaluation (Signed)
Physical Therapy Evaluation Patient Details Name: Robin GuardianColleen [name] MRN: [id number] DOB: [birth year] Today's Date: 11/25/2013   History of Present Illness  Pt is a 48 y/o female admitted s/p L THA posterior approach. Pt also with painful R hip and plans to have R replacement done soon.   Clinical Impression  Pt is s/p THA resulting in the deficits listed below (see PT Problem List). At the time of PT eval, pt was able to perform transfers and ambulation with min assist/min guard. Pt will benefit from skilled PT to increase their independence and safety with mobility to allow discharge to the venue listed below.       Follow Up Recommendations Home health PT    Equipment Recommendations  Rolling walker with 5" wheels    Recommendations for Other Services       Precautions / Restrictions Precautions Precautions: Fall;Posterior Hip Precaution Booklet Issued: Yes (comment) Precaution Comments: Discussed precautions with pt and daughter Restrictions Weight Bearing Restrictions: Yes LLE Weight Bearing: Weight bearing as tolerated      Mobility  Bed Mobility               General bed mobility comments: Pt sitting up in recliner upon PT arrival  Transfers Overall transfer level: Needs assistance Equipment used: Rolling walker (2 wheeled) Transfers: Sit to/from Stand Sit to Stand: Min assist         General transfer comment: VC's for hand placement on seated surface for safety. Assist to power-up to full stand.   Ambulation/Gait Ambulation/Gait assistance: Min guard Ambulation Distance (Feet): 50 Feet Assistive device: Rolling walker (2 wheeled) Gait Pattern/deviations: Step-to pattern;Step-through pattern;Decreased stride length;Trunk flexed Gait velocity: Decreased Gait velocity interpretation: Below normal speed for age/gender General Gait Details: VC's for sequencing and safety awareness with the RW. Specific cues for increased heel strike and walker placement.    Stairs            Wheelchair Mobility    Modified Rankin (Stroke Patients Only)       Balance Overall balance assessment: Needs assistance Sitting-balance support: Feet supported Sitting balance-Leahy Scale: Fair     Standing balance support: Bilateral upper extremity supported Standing balance-Leahy Scale: Poor                               Pertinent Vitals/Pain Pt reports minimal pain throughout session.     Home Living Family/patient expects to be discharged to:: Private residence Living Arrangements: Children;Other relatives Available Help at Discharge: Family;Available 24 hours/day Type of Home: House Home Access: Stairs to enter Entrance Stairs-Rails: None Entrance Stairs-Number of Steps: 1 small step Home Layout: One level Home Equipment: Cane - single point;Bedside commode;Hand held shower head;Shower seat (Shower stool is low)      Prior Function Level of Independence: Independent with assistive device(s)               Hand Dominance   Dominant Hand: Right    Extremity/Trunk Assessment   Upper Extremity Assessment: Defer to OT evaluation           Lower Extremity Assessment: LLE deficits/detail;RLE deficits/detail RLE Deficits / Details: Painful R hip due to OA LLE Deficits / Details: Decreased strength and AROM consistent with THA  Cervical / Trunk Assessment: Normal  Communication   Communication: No difficulties  Cognition Arousal/Alertness: Awake/alert Behavior During Therapy: WFL for tasks assessed/performed Overall Cognitive Status: Within Functional Limits for tasks assessed  General Comments      Exercises Total Joint Exercises Ankle Circles/Pumps: 10 reps Quad Sets: 10 reps Towel Squeeze: 10 reps Short Arc Quad: 10 reps Heel Slides: 10 reps Hip ABduction/ADduction: 10 reps      Assessment/Plan    PT Assessment Patient needs continued PT services  PT Diagnosis  Difficulty walking;Acute pain   PT Problem List Decreased strength;Decreased range of motion;Decreased activity tolerance;Decreased balance;Decreased mobility;Decreased knowledge of use of DME;Decreased safety awareness;Decreased knowledge of precautions;Pain  PT Treatment Interventions DME instruction;Gait training;Stair training;Functional mobility training;Therapeutic activities;Therapeutic exercise;Neuromuscular re-education;Patient/family education   PT Goals (Current goals can be found in the Care Plan section) Acute Rehab PT Goals Patient Stated Goal: To progress enough to get the R hip done soon PT Goal Formulation: With patient Time For Goal Achievement: 12/02/13 Potential to Achieve Goals: Good    Frequency 7X/week   Barriers to discharge        Co-evaluation               End of Session Equipment Utilized During Treatment: Gait belt Activity Tolerance: Patient tolerated treatment well Patient left: in chair;with call bell/phone within reach;with family/visitor present Nurse Communication: Mobility status         Time: 1610-9604 PT Time Calculation (min): 31 min   Charges:   PT Evaluation $Initial PT Evaluation Tier I: 1 Procedure PT Treatments $Therapeutic Exercise: 8-22 mins $Therapeutic Activity: 8-22 mins   PT G Codes:          Ruthann Cancer 11/25/2013, 10:53 AM  Ruthann Cancer, PT, DPT Acute Rehabilitation Services Pager: [phone]

## 2013-11-25 NOTE — Progress Notes (Signed)
Occupational Therapy Evaluation and Discharge Patient Details Name: Robin [name] MRN: [id number] DOB: 1966/02/06 Today's Date: 11/25/2013    History of Present Illness Pt is a 48 y/o female admitted s/p L THA posterior approach. Pt also with painful R hip and plans to have R replacement done soon.    Clinical Impression   PTA pt lived at home and was modified independent for ADLs and mobility with use of single point cane and sock aid. Pt reports that she has been using cane to doff socks and sock aid to done. Pt also able to use cane to dress LB. Education provided for compensatory techniques with use of cane and sock aid to dress LB. Opportunity provided to practice tub transfer and pt declined. Pt educated on correct tub transfer with use of RW and BSC and pt stated that she understood. No further acute OT needs at this time. Acute OT to sign off.     Follow Up Recommendations  No OT follow up;Supervision/Assistance - 24 hour    Equipment Recommendations  None recommended by OT       Precautions / Restrictions Precautions Precautions: Fall;Posterior Hip Precaution Booklet Issued: Yes (comment) Precaution Comments: Discussed precautions with pt and daughter Restrictions Weight Bearing Restrictions: Yes LLE Weight Bearing: Weight bearing as tolerated      Mobility                  Transfers Overall transfer level: Needs assistance Equipment used: Rolling walker (2 wheeled) Transfers: Sit to/from Stand Sit to Stand: Min assist         General transfer comment: VC's for hand placement on seated surface for safety. Assist to power-up to full stand.          ADL Overall ADL's : Needs assistance/impaired Eating/Feeding: Independent;Sitting   Grooming: Set up;Sitting   Upper Body Bathing: Set up;Sitting   Lower Body Bathing: Minimal assistance;Sit to/from stand   Upper Body Dressing : Set up;Sitting   Lower Body Dressing: Minimal assistance;With adaptive  equipment;Sit to/from stand   Toilet Transfer: Minimal assistance;Adhering to hip precautions;Stand-pivot;RW (sit<>stand from recliner)             General ADL Comments: Pt was modified independent for ADLs with use of single point cane and sock aid. Will be able to continue these methods when home and will have assistance. Pt with no concerns about tub transfer and declined to practice in rehab gym.                Pertinent Vitals/Pain No c/o pain     Hand Dominance Right   Extremity/Trunk Assessment Upper Extremity Assessment Upper Extremity Assessment: Overall WFL for tasks assessed   Lower Extremity Assessment Lower Extremity Assessment: Defer to PT evaluation   Cervical / Trunk Assessment Cervical / Trunk Assessment: Normal   Communication Communication Communication: No difficulties   Cognition Arousal/Alertness: Awake/alert Behavior During Therapy: WFL for tasks assessed/performed Overall Cognitive Status: Within Functional Limits for tasks assessed                                Home Living Family/patient expects to be discharged to:: Private residence Living Arrangements: Children;Other relatives Available Help at Discharge: Family;Available 24 hours/day Type of Home: House Home Access: Stairs to enter Enter[employer] CorporationEntrance Stairs-Number of Steps: 1 small step Entrance Stairs-Rails: None Home Layout: One level     Bathroom Shower/Tub: Tub/shower unit;Door Shower/tub characteristics: Sport and exercise psychologistDoor Bathroom Toilet:  Standard     Home Equipment: Gilmer Mor - single point;Bedside commode;Hand held shower head;Shower seat          Prior Functioning/Environment Level of Independence: Independent with assistive device(s)                                       End of Session Equipment Utilized During Treatment: Gait belt;Rolling walker  Activity Tolerance: Patient tolerated treatment well Patient left: in chair;with call bell/phone within reach    Time: 1142-1156 OT Time Calculation (min): 14 min Charges:  OT General Charges $OT Visit: 1 Procedure OT Evaluation $Initial OT Evaluation Tier I: 1 Procedure OT Treatments $Self Care/Home Management : 8-22 mins   Rae Lips [phone] 11/25/2013, 1:53 PM

## 2013-11-25 NOTE — Progress Notes (Signed)
PATIENT ID: Robin [name]  MRN: [id number]  DOB/AGE:  31-Mar-1966 / 48 y.o.  1 Day Post-Op Procedure(s) (LRB): LEFT TOTAL HIP ARTHROPLASTY (Left)    PROGRESS NOTE Subjective: Patient is alert, oriented,no Nausea, no Vomiting, not passing gas yet, no Bowel Movement. Taking PO well. Denies SOB, Chest or Calf Pain. Using Incentive Spirometer, PAS in place. Ambulate WBAT Patient reports pain as 3 on 0-10 scale  .    Objective: Vital signs in last 24 hours: Filed Vitals:   11/24/13 2000 11/24/13 2029 11/25/13 0534 11/25/13 0734  BP:  110/70 116/66   Pulse:  88 89   Temp:  99.2 F (37.3 C) 99.9 F (37.7 C)   TempSrc:      Resp: 16 18 18    Weight:      SpO2: 98% 97% 97% 96%      Intake/Output from previous day: I/O last 3 completed shifts: In: 1494.2 [P.O.:240; I.V.:1254.2] Out: 400 [Blood:400]   Intake/Output this shift:     LABORATORY DATA:  Recent Labs  11/25/13 0500  NA 138  K 4.5  CL 100  CO2 24  BUN 10  CREATININE 0.68  GLUCOSE 144*  CALCIUM 8.6    Examination: Neurologically intact Neurovascular intact Sensation intact distally Intact pulses distally Dorsiflexion/Plantar flexion intact Incision: dressing C/D/I and no drainage No cellulitis present Compartment soft} XR AP&Lat of hip shows well placed\fixed THA  Assessment:   1 Day Post-Op Procedure(s) (LRB): LEFT TOTAL HIP ARTHROPLASTY (Left) ADDITIONAL DIAGNOSIS:  asthma  Plan: PT/OT WBAT, THA  posterior precautions  DVT Prophylaxis: SCDx72 hrs, ASA 325 mg BID x 2 weeks  DISCHARGE PLAN: Home when pt meets PT goals  DISCHARGE NEEDS: HHPT, HHRN, Walker and 3-in-1 comode seat

## 2013-11-25 NOTE — Progress Notes (Signed)
Clinical Child psychotherapistocial Worker (CSW) received consult for SNF placement. Per chart PT is recommending home health. CSW made case manager aware of above. Please reconsult if further social work needs arise. CSW signing off.   Jetta LoutBailey Morgan, LCSWA Weekend CSW [phone]

## 2013-11-25 NOTE — Progress Notes (Signed)
Physical Therapy Treatment Patient Details Name: Robin [name] MRN: [id number] DOB: 1966-03-17 Today's Date: 11/25/2013    History of Present Illness Pt is a 48 y/o female admitted s/p L THA posterior approach. Pt also with painful R hip and plans to have R replacement done soon.     PT Comments    Pt progressing well towards physical therapy goals. Antalgic gait due to expected pain and weakness from surgery, however pt feels her R hip is limiting her in improving her gait pattern. Will continue to follow.   Follow Up Recommendations  Home health PT     Equipment Recommendations  Rolling walker with 5" wheels    Recommendations for Other Services       Precautions / Restrictions Precautions Precautions: Fall;Posterior Hip Precaution Booklet Issued: Yes (comment) Precaution Comments: Discussed precautions with pt and daughter Restrictions Weight Bearing Restrictions: Yes LLE Weight Bearing: Weight bearing as tolerated    Mobility  Bed Mobility Overal bed mobility: Needs Assistance Bed Mobility: Supine to Sit     Supine to sit: Min assist     General bed mobility comments: VC's for sequencing and technique. Assist for movement and support of LLE as pt transitioned to EOB.   Transfers Overall transfer level: Needs assistance Equipment used: Rolling walker (2 wheeled) Transfers: Sit to/from Stand Sit to Stand: Min assist         General transfer comment: VC's for hand placement on seated surface for safety. Assist to power-up to full stand.   Ambulation/Gait Ambulation/Gait assistance: Min guard Ambulation Distance (Feet): 100 Feet Assistive device: Rolling walker (2 wheeled) Gait Pattern/deviations: Step-to pattern;Step-through pattern;Decreased stride length;Trunk flexed Gait velocity: Decreased Gait velocity interpretation: Below normal speed for age/gender General Gait Details: VC's for sequencing and safety awareness with the RW. Specific cues for  increased heel strike and walker placement.    Stairs            Wheelchair Mobility    Modified Rankin (Stroke Patients Only)       Balance Overall balance assessment: Needs assistance Sitting-balance support: Feet supported Sitting balance-Leahy Scale: Fair     Standing balance support: Bilateral upper extremity supported Standing balance-Leahy Scale: Poor                      Cognition Arousal/Alertness: Awake/alert Behavior During Therapy: WFL for tasks assessed/performed Overall Cognitive Status: Within Functional Limits for tasks assessed                      Exercises Total Joint Exercises Ankle Circles/Pumps: 10 reps Quad Sets: 10 reps Short Arc Quad: 10 reps Heel Slides: 10 reps Hip ABduction/ADduction: 10 reps Long Arc Quad: 10 reps    General Comments        Pertinent Vitals/Pain Pt reports minimal pain throughout session.     Home Living Family/patient expects to be discharged to:: Private residence Living Arrangements: Children;Other relatives Available Help at Discharge: Family;Available 24 hours/day Type of Home: House Home Access: Stairs to enter Entrance Stairs-Rails: None Home Layout: One level Home Equipment: Cane - single point;Bedside commode;Hand held shower head;Shower seat      Prior Function Level of Independence: Independent with assistive device(s)          PT Goals (current goals can now be found in the care plan section) Acute Rehab PT Goals Patient Stated Goal: To progress enough to get the R hip done soon PT Goal Formulation: With patient Time For  Goal Achievement: 12/02/13 Potential to Achieve Goals: Good Progress towards PT goals: Progressing toward goals    Frequency  7X/week    PT Plan Current plan remains appropriate    Co-evaluation             End of Session Equipment Utilized During Treatment: Gait belt Activity Tolerance: Patient tolerated treatment well Patient left: in  chair;with call bell/phone within reach;with family/visitor present     Time: 1450-1516 PT Time Calculation (min): 26 min  Charges:  $Gait Training: 8-22 mins $Therapeutic Exercise: 8-22 mins                    G Codes:      Robin CancerHamilton, Robin [name] 11/25/2013, 3:36 PM  Robin CancerLaura [name], PT, DPT Acute Rehabilitation Services Pager: [phone]

## 2013-11-26 LAB — CBC
HCT: 33.6 % — ABNORMAL LOW (ref 36.0–46.0)
Hemoglobin: 10.9 g/dL — ABNORMAL LOW (ref 12.0–15.0)
MCH: 30.2 pg (ref 26.0–34.0)
MCHC: 32.4 g/dL (ref 30.0–36.0)
MCV: 93.1 fL (ref 78.0–100.0)
Platelets: 216 10*3/uL (ref 150–400)
RBC: 3.61 MIL/uL — AB (ref 3.87–5.11)
RDW: 12.7 % (ref 11.5–15.5)
WBC: 11.9 10*3/uL — ABNORMAL HIGH (ref 4.0–10.5)

## 2013-11-26 NOTE — Progress Notes (Signed)
   CARE MANAGEMENT NOTE 11/26/2013  Patient:  Robin [name],Robin [name]   Account Number:  [id number]  Date Initiated:  11/25/2013  Documentation initiated by:  Vance PeperBRADY,SUSAN  Subjective/Objective Assessment:   48 yr old female admitted with osteoarthritis of left hip. S/p left total hip arthroplasty.     Action/Plan:   Anticipated DC Date:  11/26/2013   Anticipated DC Plan:  HOME W HOME HEALTH SERVICES      DC Planning Services  CM consult      PAC Choice  DURABLE MEDICAL EQUIPMENT  HOME HEALTH   Choice offered to / List presented to:  C-1 Patient   DME arranged  WALKER - ROLLING      DME agency  [employer]     HH arranged  HH-1 RN  HH-2 PT  HH-3 OT      [facility]   Status of service:  In process, will continue to follow Medicare Important Message given?   (If response is "NO", the following Medicare IM given date fields will be blank) Date Medicare IM given:   Date Additional Medicare IM given:    Discharge Disposition:  HOME W HOME HEALTH SERVICES  Per UR Regulation:    If discussed at Long Length of Stay Meetings, dates discussed:    Comments:  11/26/13 09:35 CM called [facility] rep, Robin [name], to notify of pt discharge.  [facility] will render HHPT/OT/RN services.  No other CM needs were communicated.  Robin JakschSarah Momin [name], BSN, CM [phone].

## 2013-11-26 NOTE — Progress Notes (Signed)
Physical Therapy Treatment Patient Details Name: Robin [name] MRN: [id number] DOB: Mar 13, 1966 Today's Date: 11/26/2013    History of Present Illness Pt is a 48 y/o female admitted s/p L THA posterior approach. Pt also with painful R hip and plans to have R replacement done soon.     PT Comments    Pt with increased stiffness this am, but attributes this to sleeping so soundly last night and not moving.  Will attempt single step during second session.    Follow Up Recommendations  Home health PT     Equipment Recommendations  Rolling walker with 5" wheels    Recommendations for Other Services       Precautions / Restrictions Precautions Precautions: Fall;Posterior Hip Precaution Comments: pt verbalized 3/3 precautions.   Restrictions Weight Bearing Restrictions: Yes LLE Weight Bearing: Weight bearing as tolerated    Mobility  Bed Mobility Overal bed mobility: Needs Assistance Bed Mobility: Supine to Sit     Supine to sit: Min assist     General bed mobility comments: A with L LE only.    Transfers Overall transfer level: Needs assistance Equipment used: Rolling walker (2 wheeled) Transfers: Sit to/from Stand Sit to Stand: Min assist         General transfer comment: cues for UE use.  MinG for returning to sitting.    Ambulation/Gait Ambulation/Gait assistance: Min guard Ambulation Distance (Feet): 100 Feet Assistive device: Rolling walker (2 wheeled) Gait Pattern/deviations: Step-to pattern;Decreased step length - right;Decreased stance time - left   Gait velocity interpretation: Below normal speed for age/gender General Gait Details: cues for upright posture.  pt needed multiple standing rest breaks this am 2/2 increased stiffness from being so still all night.     Stairs            Wheelchair Mobility    Modified Rankin (Stroke Patients Only)       Balance Overall balance assessment: Needs assistance         Standing balance support:  Bilateral upper extremity supported Standing balance-Leahy Scale: Poor                      Cognition Arousal/Alertness: Awake/alert Behavior During Therapy: WFL for tasks assessed/performed Overall Cognitive Status: Within Functional Limits for tasks assessed                      Exercises Total Joint Exercises Ankle Circles/Pumps: AROM;Both;10 reps Quad Sets: AROM;Both;10 reps Short Arc Quad: AROM;Left;10 reps Hip ABduction/ADduction: AAROM;Left;10 reps Long Arc Quad: AROM;Left;10 reps    General Comments        Pertinent Vitals/Pain "Really stiff this morning."  Premedicated per pt.      Home Living                      Prior Function            PT Goals (current goals can now be found in the care plan section) Acute Rehab PT Goals Time For Goal Achievement: 12/02/13 Potential to Achieve Goals: Good Progress towards PT goals: Progressing toward goals    Frequency  7X/week    PT Plan Current plan remains appropriate    Co-evaluation             End of Session Equipment Utilized During Treatment: Gait belt Activity Tolerance: Patient tolerated treatment well Patient left: in chair;with call bell/phone within reach     Time: 4098-1191 PT Time Calculation (  min): 26 min  Charges:  $Gait Training: 8-22 mins $Therapeutic Exercise: 8-22 mins                    G CodesSunny Schlein:      Lacrystal Barbe F, [place] CarolinaPT [phone] 11/26/2013, 8:21 AM

## 2013-11-26 NOTE — Discharge Summary (Signed)
Patient ID: Robin [name] MRN: [id number] DOB/AGE: 04/26/1966 48 y.o.  Admit date: 11/24/2013 Discharge date: 11/26/2013  Admission Diagnoses:  Principal Problem:   Arthritis of left hip   Discharge Diagnoses:  Same  Past Medical History  Diagnosis Date  . Hyperlipidemia     borderline no meds required  . Shortness of breath     lying/sitting/exertion  . Asthma     Flonase,Singulair,Advair daily  . COPD (chronic obstructive pulmonary disease)     uses Albuterol daily as needed  . History of bronchitis     last time 5776yrs ago  . Convulsion     as a baby   . Arthritis   . Joint pain   . Joint swelling   . Dysphagia   . Kidney stone     has been there for a long time but no problems    Surgeries: Procedure(s): LEFT TOTAL HIP ARTHROPLASTY on 11/24/2013   Consultants:    Discharged Condition: Improved  Hospital Course: Robin GuardianColleen Finan is an 48 y.o. female who was admitted 11/24/2013 for operative treatment ofArthritis of left hip. Patient has severe unremitting pain that affects sleep, daily activities, and work/hobbies. After pre-op clearance the patient was taken to the operating room on 11/24/2013 and underwent  Procedure(s): LEFT TOTAL HIP ARTHROPLASTY.    Patient was given perioperative antibiotics: Anti-infectives   Start     Dose/Rate Route Frequency Ordered Stop   11/24/13 0600  ceFAZolin (ANCEF) IVPB 2 g/50 mL premix     2 g 100 mL/hr over 30 Minutes Intravenous On call to O.R. 11/23/13 1415 11/24/13 1207       Patient was given sequential compression devices, early ambulation, and chemoprophylaxis to prevent DVT.  Patient benefited maximally from hospital stay and there were no complications.    Recent vital signs: Patient Vitals for the past 24 hrs:  BP Temp Temp src Pulse Resp SpO2 Height Weight  11/26/13 0800 - - - - - - 5\' 2"  (1.575 m) 101.606 kg (224 lb)  11/26/13 0436 118/73 mmHg 99.2 F (37.3 C) Oral 90 16 97 % - -  11/26/13 0212 115/68 mmHg 99 F (37.2  C) Oral 92 16 94 % - -  11/25/13 2100 118/63 mmHg 98.6 F (37 C) Oral 110 20 95 % - -  11/25/13 2035 - - - 103 18 95 % - -  11/25/13 1600 - - - - 16 96 % - -  11/25/13 1231 133/74 mmHg 98.6 F (37 C) Oral 101 18 98 % - -  11/25/13 1200 - - - - 18 98 % - -     Recent laboratory studies:  Recent Labs  11/25/13 0500 11/25/13 0957 11/26/13 0450  WBC  --  13.7* 11.9*  HGB  --  12.1 10.9*  HCT  --  36.8 33.6*  PLT  --  273 216  NA 138  --   --   K 4.5  --   --   CL 100  --   --   CO2 24  --   --   BUN 10  --   --   CREATININE 0.68  --   --   GLUCOSE 144*  --   --   CALCIUM 8.6  --   --      Discharge Medications:     Medication List         acetaminophen 650 MG CR tablet  Commonly known as:  TYLENOL  Take 2 tablets (1,300  mg total) by mouth every 8 (eight) hours as needed for pain.     albuterol (2.5 MG/3ML) 0.083% nebulizer solution  Commonly known as:  PROVENTIL  Take 3 mLs (2.5 mg total) by nebulization every 6 (six) hours as needed for wheezing.     aspirin EC 325 MG tablet  Take 1 tablet (325 mg total) by mouth 2 (two) times daily.     fluticasone 50 MCG/ACT nasal spray  Commonly known as:  FLONASE  Place 2 sprays into both nostrils daily.     Fluticasone-Salmeterol 250-50 MCG/DOSE Aepb  Commonly known as:  ADVAIR  Inhale 1 puff into the lungs 2 (two) times daily.     methocarbamol 500 MG tablet  Commonly known as:  ROBAXIN  Take 1 tablet (500 mg total) by mouth 2 (two) times daily with a meal.     montelukast 10 MG tablet  Commonly known as:  SINGULAIR  Take 1 tablet (10 mg total) by mouth at bedtime.     OVER THE COUNTER MEDICATION  Apply 1 application topically 2 (two) times daily as needed (arthritis). Two old goats cream     oxyCODONE-acetaminophen 5-325 MG per tablet  Commonly known as:  ROXICET  Take 1 tablet by mouth every 4 (four) hours as needed.     predniSONE 20 MG tablet  Commonly known as:  DELTASONE  Take 2 tablets (40 mg total)  by mouth daily.     traMADol 50 MG tablet  Commonly known as:  ULTRAM  1-2 tabs by mouth Q8 hours, maximum 6 tabs per day.        Diagnostic Studies: Dg Chest 2 View  11/15/2013   CLINICAL DATA:  Preop exam prior to hip replacement; history of COPD and asthma  EXAM: CHEST  2 VIEW  COMPARISON:  DG CHEST 2V dated 06/29/2011  FINDINGS: The lungs are well-expanded. There is mild hemidiaphragm flattening. There is no focal infiltrate. The cardiac silhouette is normal in size. The pulmonary vascularity is not engorged. There is a stable calcified nodule 1 cm lateral to the aortic arch on the frontal film consistent with previous granulomatous infection. It measures approximately 8 mm in maximal dimension. There is no pleural effusion. The trachea is midline. The observed portions of the bony thorax appear normal.  IMPRESSION: There is no evidence of active cardiopulmonary disease. Mild hyperinflation likely reflects known COPD and/or reactive airway disease.   Electronically Signed   By: David  [place]   On: 11/15/2013 16:26   Dg Pelvis Portable  11/24/2013   CLINICAL DATA:  Postop left hip arthroplasty  EXAM: PORTABLE PELVIS 1-2 VIEWS  COMPARISON:  None.  FINDINGS: New left hip prosthesis is well-seated and aligned. There is no acute fracture or evidence of an operative complication.  There are advanced arthropathic changes of the right hip.  IMPRESSION: New right hip prosthesis is well-seated and aligned.   Electronically Signed   By: Amie Portland M.D.   On: 11/24/2013 14:45    Disposition: Final discharge disposition not confirmed      Discharge Orders   Future Orders Complete By Expires   Call MD / Call 911  As directed    Comments:     If you experience chest pain or shortness of breath, CALL 911 and be transported to the hospital emergency room.  If you develope a fever above 101 F, pus (white drainage) or increased drainage or redness at the wound, or calf pain, call your surgeon's office.  Change dressing  As directed    Comments:     You may change your dressing on day 5, then change the dressing daily with sterile 4 x 4 inch gauze dressing and paper tape.  You may clean the incision with alcohol prior to redressing   Constipation Prevention  As directed    Comments:     Drink plenty of fluids.  Prune juice may be helpful.  You may use a stool softener, such as Colace (over the counter) 100 mg twice a day.  Use MiraLax (over the counter) for constipation as needed.   Diet - low sodium heart healthy  As directed    Discharge instructions  As directed    Comments:     Follow up in office with Dr. Turner Daniels in 2 weeks.   Driving restrictions  As directed    Comments:     No driving for 2 weeks   Follow the hip precautions as taught in Physical Therapy  As directed    Increase activity slowly as tolerated  As directed    Patient may shower  As directed    Comments:     You may shower without a dressing once there is no drainage.  Do not wash over the wound.  If drainage remains, cover wound with plastic wrap and then shower.      Follow-up Information   Follow up with Nestor Lewandowsky, MD In 2 weeks.   Specialty:  Orthopedic Surgery   Contact information:   [address] [place] [address] [phone]        Signed: Vear Clock ERIC R 11/26/2013, 8:55 AM

## 2013-11-26 NOTE — Progress Notes (Signed)
Physical Therapy Note   11/26/13 1100  PT Visit Information  Last PT Received On 11/26/13  Assistance Needed +1  History of Present Illness Pt is a 48 y/o female admitted s/p L THA posterior approach. Pt also with painful R hip and plans to have R replacement done soon.   PT Time Calculation  PT Start Time 1114  PT Stop Time 1130  PT Time Calculation (min) 16 min  Precautions  Precautions Fall;Posterior Hip  Precaution Comments pt verbalized 3/3 precautions.    Restrictions  Weight Bearing Restrictions Yes  LLE Weight Bearing WBAT  Cognition  Arousal/Alertness Awake/alert  Behavior During Therapy WFL for tasks assessed/performed  Overall Cognitive Status Within Functional Limits for tasks assessed  Transfers  Overall transfer level Needs assistance  Equipment used Rolling walker (2 wheeled)  Transfers Sit to/from Stand  Sit to Stand Min guard  General transfer comment close guarding only.  pt able to come to stand without A this session.  Ambulation/Gait  Ambulation/Gait assistance Min guard  Ambulation Distance (Feet) 75 Feet  Assistive device Rolling walker (2 wheeled)  Gait Pattern/deviations Step-to pattern;Decreased step length - right;Decreased stance time - left  General Gait Details cues for upright posture.    Stairs Yes  Stairs assistance Min guard  Stair Management No rails;Step to pattern;Forwards;With walker  Number of Stairs 1  General stair comments cues for use of RW and stair sequencing.    PT - End of Session  Equipment Utilized During Treatment Gait belt  Activity Tolerance Patient tolerated treatment well  Patient left in chair;with call bell/phone within reach  Nurse Communication Mobility status  PT - Assessment/Plan  PT Plan Current plan remains appropriate  PT Frequency 7X/week  Follow Up Recommendations Home health PT  PT equipment Rolling walker with 5" wheels  PT Goal Progression  Progress towards PT goals Progressing toward goals  Acute  Rehab PT Goals  Time For Goal Achievement 12/02/13  Potential to Achieve Goals Good  PT General Charges  $$ ACUTE PT VISIT 1 Procedure  PT Treatments  $Gait Training 8-22 mins   Johnson ParkMegan Ohanna Gassert, PT [phone]

## 2013-11-26 NOTE — Progress Notes (Signed)
PATIENT ID: Robin [name]  MRN: [id number]  DOB/AGE:  Sep 27, 1965 / 48 y.o.  2 Days Post-Op Procedure(s) (LRB): LEFT TOTAL HIP ARTHROPLASTY (Left)    PROGRESS NOTE Subjective: Patient is alert, oriented,no Nausea, no Vomiting, yes passing gas, no Bowel Movement. Taking PO well. Denies SOB, Chest or Calf Pain. Using Incentive Spirometer, PAS in place. Ambulate WBAt Patient reports pain as 2 on 0-10 scale and 3 on 0-10 scale  .    Objective: Vital signs in last 24 hours: Filed Vitals:   11/25/13 2100 11/26/13 0212 11/26/13 0436 11/26/13 0800  BP: 118/63 115/68 118/73   Pulse: 110 92 90   Temp: 98.6 F (37 C) 99 F (37.2 C) 99.2 F (37.3 C)   TempSrc: Oral Oral Oral   Resp: 20 16 16    Height:    5\' 2"  (1.575 m)  Weight:    101.606 kg (224 lb)  SpO2: 95% 94% 97%       Intake/Output from previous day: I/O last 3 completed shifts: In: 800 [P.O.:800] Out: 300 [Urine:300]   Intake/Output this shift:     LABORATORY DATA:  Recent Labs  11/25/13 0500 11/25/13 0957 11/26/13 0450  WBC  --  13.7* 11.9*  HGB  --  12.1 10.9*  HCT  --  36.8 33.6*  PLT  --  273 216  NA 138  --   --   K 4.5  --   --   CL 100  --   --   CO2 24  --   --   BUN 10  --   --   CREATININE 0.68  --   --   GLUCOSE 144*  --   --   CALCIUM 8.6  --   --     Examination: Neurologically intact ABD soft Neurovascular intact Sensation intact distally Intact pulses distally Dorsiflexion/Plantar flexion intact Incision: dressing C/D/I No cellulitis present Compartment soft} XR AP&Lat of hip shows well placed\fixed THA  Assessment:   2 Days Post-Op Procedure(s) (LRB): LEFT TOTAL HIP ARTHROPLASTY (Left) ADDITIONAL DIAGNOSIS:  asthma  Plan: PT/OT WBAT, THA  posterior precautions  DVT Prophylaxis: SCDx72 hrs, ASA 325 mg BID x 2 weeks  DISCHARGE PLAN: Home today when pt meets her PT goals  DISCHARGE NEEDS: HHPT, HHRN, Walker and 3-in-1 comode seat

## 2013-11-28 ENCOUNTER — Encounter (HOSPITAL_COMMUNITY): Payer: Self-pay | Admitting: Orthopedic Surgery

## 2013-12-21 ENCOUNTER — Encounter: Payer: Self-pay | Admitting: Family Medicine

## 2013-12-21 ENCOUNTER — Ambulatory Visit (INDEPENDENT_AMBULATORY_CARE_PROVIDER_SITE_OTHER): Payer: BC Managed Care – PPO | Admitting: Family Medicine

## 2013-12-21 VITALS — BP 129/80 | HR 79 | Wt 222.0 lb

## 2013-12-21 DIAGNOSIS — R1310 Dysphagia, unspecified: Secondary | ICD-10-CM

## 2013-12-21 DIAGNOSIS — K219 Gastro-esophageal reflux disease without esophagitis: Secondary | ICD-10-CM

## 2013-12-21 DIAGNOSIS — J45909 Unspecified asthma, uncomplicated: Secondary | ICD-10-CM

## 2013-12-21 MED ORDER — ALBUTEROL SULFATE (2.5 MG/3ML) 0.083% IN NEBU: 2.5000 mg | INHALATION_SOLUTION | Freq: Four times a day (QID) | RESPIRATORY_TRACT | Status: DC | PRN

## 2013-12-21 MED ORDER — ALBUTEROL SULFATE HFA 108 (90 BASE) MCG/ACT IN AERS: 2.0000 | INHALATION_SPRAY | RESPIRATORY_TRACT | Status: DC | PRN

## 2013-12-21 MED ORDER — E-Z SPACER DEVI: Status: DC

## 2013-12-21 NOTE — Progress Notes (Signed)
Subjective:    Patient ID: Robin [name], female    DOB: 1966-02-21, 48 y.o.   MRN: [id number]  HPI Followup asthma- With spring she has had to use her albuterol BID. Using hr Advair daily as well.  Says can't walk very far because of her asthma. She feels like she has to stop and rest. No recent upper respiratory infection or fever or cold symptoms.  Dysphagia-she also has noticed the sensation of food getting stuck in her lower chest when she eats especially bread. It's been going on for quite some time. There will alleviating factors. She says sometimes it well but eventually passed and sometimes she will vomit back them food.  No prior history of esophageal problems or strictures. She did have endoscopy years ago. She's also been having frequent heart burn symptoms. She's been taking TUMS at least 3-4 times per week. Review of Systems BP 129/80  Pulse 79  Wt 222 lb (100.699 kg)  SpO2 97%  PF 370 L/min    No Known Allergies  Past Medical History  Diagnosis Date  . Hyperlipidemia     borderline no meds required  . Shortness of breath     lying/sitting/exertion  . Asthma     Flonase,Singulair,Advair daily  . COPD (chronic obstructive pulmonary disease)     uses Albuterol daily as needed  . History of bronchitis     last time 16100yrs ago  . Convulsion     as a baby   . Arthritis   . Joint pain   . Joint swelling   . Dysphagia   . Kidney stone     has been there for a long time but no problems    Past Surgical History  Procedure Laterality Date  . Lumbar laminectomy  1994    L4-5  . Cholecystectomy  2002  . Ercp    . Total hip arthroplasty Left 11/24/2013    Procedure: LEFT TOTAL HIP ARTHROPLASTY;  Surgeon: Nestor LewandowskyFrank J Rowan, MD;  Location: [facility] OR;  Service: Orthopedics;  Laterality: Left;    History   Social History  . Marital Status: Married    Spouse Name: N/A    Number of Children: 1  . Years of Education: N/A   Occupational History  . House Keeper    Social  History Main Topics  . Smoking status: Former Games developermoker  . Smokeless tobacco: Not on file     Comment: quit 8-8381yrs ago  . Alcohol Use: No  . Drug Use: No  . Sexual Activity: Yes   Other Topics Concern  . Not on file   Social History Narrative   No regular exercise.     Family History  Problem Relation Age of Onset  . Alcohol abuse Brother   . Alcohol abuse Sister   . Heart attack Father   . Diabetes      grandparents.     Outpatient Encounter Prescriptions as of 12/21/2013  Medication Sig  . acetaminophen (TYLENOL) 650 MG CR tablet Take 2 tablets (1,300 mg total) by mouth every 8 (eight) hours as needed for pain.  Marland Kitchen. albuterol (PROVENTIL) (2.5 MG/3ML) 0.083% nebulizer solution Take 3 mLs (2.5 mg total) by nebulization every 6 (six) hours as needed for wheezing.  . fluticasone (FLONASE) 50 MCG/ACT nasal spray Place 2 sprays into both nostrils daily.  . Fluticasone-Salmeterol (ADVAIR) 250-50 MCG/DOSE AEPB Inhale 1 puff into the lungs 2 (two) times daily.  . methocarbamol (ROBAXIN) 500 MG tablet Take 1 tablet (500  mg total) by mouth 2 (two) times daily with a meal.  . montelukast (SINGULAIR) 10 MG tablet Take 1 tablet (10 mg total) by mouth at bedtime.  Marland Kitchen. OVER THE COUNTER MEDICATION Apply 1 application topically 2 (two) times daily as needed (arthritis). Two old goats cream  . oxyCODONE-acetaminophen (ROXICET) 5-325 MG per tablet Take 1 tablet by mouth every 4 (four) hours as needed.  . [DISCONTINUED] albuterol (PROVENTIL) (2.5 MG/3ML) 0.083% nebulizer solution Take 3 mLs (2.5 mg total) by nebulization every 6 (six) hours as needed for wheezing.  Marland Kitchen. albuterol (PROAIR HFA) 108 (90 BASE) MCG/ACT inhaler Inhale 2 puffs into the lungs every 4 (four) hours as needed for wheezing or shortness of breath.  . Spacer/Aero-Holding Chambers (E-Z SPACER) inhaler Use as instructed. Can substitute similar device for spacer for MDI  . [DISCONTINUED] aspirin EC 325 MG tablet Take 1 tablet (325 mg total)  by mouth 2 (two) times daily.  . [DISCONTINUED] predniSONE (DELTASONE) 20 MG tablet Take 2 tablets (40 mg total) by mouth daily.  . [DISCONTINUED] traMADol (ULTRAM) 50 MG tablet 1-2 tabs by mouth Q8 hours, maximum 6 tabs per day.           Objective:   Physical Exam  Constitutional: She is oriented to person, place, and time. She appears well-developed and well-nourished.  HENT:  Head: Normocephalic and atraumatic.  Cardiovascular: Normal rate, regular rhythm and normal heart sounds.   Pulmonary/Chest: Effort normal and breath sounds normal.  Neurological: She is alert and oriented to person, place, and time.  Skin: Skin is warm and dry.  Psychiatric: She has a normal mood and affect. Her behavior is normal.          Assessment & Plan:  Asthma- she's having daily symptoms on her Advair. We'll send a new prescription for PROAIR. Also recommend pretreatment for exercise. This should help with her insurance as well. Will add spacer to regimen.   Dysphagia-recommend referral to GI for evaluation for possible stricture. Explained her that this would typically require endoscopy procedure for further evaluation. She understands it is okay with referral today.  GERD-recommend a trial of a PPI for at least 6 weeks to see if this improves her reflux symptoms. Can still use TUMS intermittently with this if needed. She says she will try to get some Prilosec or Nexium over-the-counter.

## 2013-12-21 NOTE — Patient Instructions (Signed)
Recommend use a spacer with your albuterol. Helped suspend the medication particles a little bit longer so that way if you're technique isn't examined she still gets a good amount of the medication into the deep part of your lungs to help open him up. Recommend pretreatment before exercise with ropinirole, 2-4 puffs about 10-15 minutes before hand.

## 2014-01-03 ENCOUNTER — Telehealth: Payer: Self-pay

## 2014-01-03 NOTE — Telephone Encounter (Signed)
Robin [name]'s Dentist wants her to have pretreatment of Antibiotics before her appointment. She has a cracked tooth. She had a hip replacement 6 months ago. Please advise.

## 2014-01-04 NOTE — Telephone Encounter (Signed)
Prophylaxis is not recommended before no procedures with a joint replacement. But if he is very concerned please let me know and I will go ahead and send in antibiotics over.

## 2014-01-05 NOTE — Telephone Encounter (Signed)
Left detailed message.   

## 2014-01-08 ENCOUNTER — Other Ambulatory Visit: Payer: Self-pay | Admitting: Orthopedic Surgery

## 2014-01-16 ENCOUNTER — Encounter: Payer: Self-pay | Admitting: Family Medicine

## 2014-01-16 ENCOUNTER — Ambulatory Visit (INDEPENDENT_AMBULATORY_CARE_PROVIDER_SITE_OTHER): Payer: BC Managed Care – PPO | Admitting: Family Medicine

## 2014-01-16 VITALS — BP 134/79 | HR 80 | Ht 62.0 in | Wt 223.0 lb

## 2014-01-16 DIAGNOSIS — J45901 Unspecified asthma with (acute) exacerbation: Secondary | ICD-10-CM

## 2014-01-16 DIAGNOSIS — J309 Allergic rhinitis, unspecified: Secondary | ICD-10-CM

## 2014-01-16 NOTE — Progress Notes (Signed)
   Subjective:    Patient ID: Robin [name], female    DOB: [birth year], 48 y.o.   MRN: [id number]  HPI F/U Asthma - overall she's doing well. She's been using her Advair daily. She sees her primary her as needed. She still has occasional flares. She's taking her Singulair. She wants to know if it's okay to add Zyrtec. She did try Flonase but it actually cause headaches so she stopped it.  AR- She wants to know if it's okay to add Zyrtec. She did try Flonase but it actually cause headaches so she stopped it.   Review of Systems     Objective:   Physical Exam  Constitutional: She is oriented to person, place, and time. She appears well-developed and well-nourished.  HENT:  Head: Normocephalic and atraumatic.  Cardiovascular: Normal rate, regular rhythm and normal heart sounds.   Pulmonary/Chest: Effort normal and breath sounds normal.  Neurological: She is alert and oriented to person, place, and time.  Skin: Skin is warm and dry.  Psychiatric: She has a normal mood and affect. Her behavior is normal.          Assessment & Plan:  Asthma-intermittent persistent. Continue with Advair. She's quite ready to step down because she still having to use her rescue inhaler a couple times a week. I think adding Zyrtec daily Singulair is a great idea and should be helpful. She could also try over-the-counter Nasonex as of the Flonase to see if she tolerates it well without headache.  AR- not maximally controlled and could not tolerate Flonase secondary to headaches. Recommend trial of Nasonex and consider adding Zyrtec.

## 2014-01-16 NOTE — Patient Instructions (Signed)
Ok to start the zyrtec.

## 2014-01-18 ENCOUNTER — Ambulatory Visit: Payer: BC Managed Care – PPO | Admitting: Family Medicine

## 2014-01-26 ENCOUNTER — Encounter (HOSPITAL_COMMUNITY): Payer: Self-pay | Admitting: Pharmacy Technician

## 2014-01-27 NOTE — Pre-Procedure Instructions (Addendum)
Karuna Robie  01/27/2014   Your procedure is scheduled on:  June 15  Report to [facility] Admitting at 05:30 AM.  Call this number if you have problems the morning of surgery: [phone]   Remember:   Do not eat food or drink liquids after midnight.   Take these medicines the morning of surgery with A SIP OF WATER: Tylenol OR Oxycodone, Albuterol, Advair, Singulair, Omeprazole,              Take all meds as ordered until day of surgery except as instructed below or per dr   STOP/ Do not take Aspirin, Aleve, Naproxen, Advil, Ibuprofen, Vitamin, Herbs, or Supplements starting 01/31/14   Do not wear jewelry, make-up or nail polish.  Do not wear lotions, powders, or perfumes. You may wear deodorant.  Do not shave 48 hours prior to surgery. Men may shave face and neck.  Do not bring valuables to the hospital.  [facility] is not responsible for any belongings or valuables.               Contacts, dentures or bridgework may not be worn into surgery.  Leave suitcase in the car. After surgery it may be brought to your room.  For patients admitted to the hospital, discharge time is determined by your  treatment team.               Special Instructions: See [facility] Preparing For Surgery   Please read over the following fact sheets that you were given: Pain Booklet, Coughing and Deep Breathing, Blood Transfusion Information, Total Joint Packet and Surgical Site Infection Prevention

## 2014-01-27 NOTE — Pre-Procedure Instructions (Signed)
[place] - Preparing for Surgery  Before surgery, you can play an important role.  Because skin is not sterile, your skin needs to be as free of germs as possible.  You can reduce the number of germs on you skin by washing with CHG (chlorahexidine gluconate) soap before surgery.  CHG is an antiseptic cleaner which kills germs and bonds with the skin to continue killing germs even after washing.  Please DO NOT use if you have an allergy to CHG or antibacterial soaps.  If your skin becomes reddened/irritated stop using the CHG and inform your nurse when you arrive at Short Stay.  Do not shave (including legs and underarms) for at least 48 hours prior to the first CHG shower.  You may shave your face.  Please follow these instructions carefully:   1.  Shower with CHG Soap the night before surgery and the morning of Surgery.  2.  If you choose to wash your hair, wash your hair first as usual with your normal shampoo.  3.  After you shampoo, rinse your hair and body thoroughly to remove the shampoo.  4.  Use CHG as you would any other liquid soap.  You can apply CHG directly to the skin and wash gently with scrungie or a clean washcloth.  5.  Apply the CHG Soap to your body ONLY FROM THE NECK DOWN.  Do not use on open wounds or open sores.  Avoid contact with your eyes, ears, mouth and genitals (private parts).  Wash genitals (private parts) with your normal soap.  6.  Wash thoroughly, paying special attention to the area where your surgery will be performed.  7.  Thoroughly rinse your body with warm water from the neck down.  8.  DO NOT shower/wash with your normal soap after using and rinsing off the CHG Soap.  9.  Pat yourself dry with a clean towel.            10.  Wear clean pajamas.            11.  Place clean sheets on your bed the night of your first shower and do not sleep with pets.  Day of Surgery  Do not apply any lotions the morning of surgery.  Please wear clean clothes to the  hospital/surgery center.   

## 2014-01-29 ENCOUNTER — Encounter (HOSPITAL_COMMUNITY): Payer: Self-pay

## 2014-01-29 ENCOUNTER — Encounter (HOSPITAL_COMMUNITY)
Admission: RE | Admit: 2014-01-29 | Discharge: 2014-01-29 | Disposition: A | Discharge status: Home / Self Care | Payer: BC Managed Care – PPO | Source: Ambulatory Visit | Attending: Orthopedic Surgery | Admitting: Orthopedic Surgery

## 2014-01-29 ENCOUNTER — Encounter: Payer: Self-pay | Admitting: Family Medicine

## 2014-01-29 DIAGNOSIS — Z01812 Encounter for preprocedural laboratory examination: Secondary | ICD-10-CM | POA: Insufficient documentation

## 2014-01-29 HISTORY — DX: Gastro-esophageal reflux disease without esophagitis: K21.9

## 2014-01-29 HISTORY — DX: Personal history of urinary calculi: Z87.442

## 2014-01-29 LAB — CBC WITH DIFFERENTIAL/PLATELET
BASOS ABS: 0 10*3/uL (ref 0.0–0.1)
Basophils Relative: 1 % (ref 0–1)
EOS PCT: 6 % — AB (ref 0–5)
Eosinophils Absolute: 0.5 10*3/uL (ref 0.0–0.7)
HCT: 40 % (ref 36.0–46.0)
HEMOGLOBIN: 12.8 g/dL (ref 12.0–15.0)
LYMPHS ABS: 2.5 10*3/uL (ref 0.7–4.0)
Lymphocytes Relative: 30 % (ref 12–46)
MCH: 28.4 pg (ref 26.0–34.0)
MCHC: 32 g/dL (ref 30.0–36.0)
MCV: 88.9 fL (ref 78.0–100.0)
MONO ABS: 0.6 10*3/uL (ref 0.1–1.0)
Monocytes Relative: 7 % (ref 3–12)
Neutro Abs: 4.6 10*3/uL (ref 1.7–7.7)
Neutrophils Relative %: 56 % (ref 43–77)
Platelets: 326 10*3/uL (ref 150–400)
RBC: 4.5 MIL/uL (ref 3.87–5.11)
RDW: 12.9 % (ref 11.5–15.5)
WBC: 8.3 10*3/uL (ref 4.0–10.5)

## 2014-01-29 LAB — BASIC METABOLIC PANEL
BUN: 20 mg/dL (ref 6–23)
CO2: 24 meq/L (ref 19–32)
CREATININE: 0.81 mg/dL (ref 0.50–1.10)
Calcium: 9.7 mg/dL (ref 8.4–10.5)
Chloride: 102 mEq/L (ref 96–112)
GFR calc Af Amer: >90 mL/min (ref >90)
GFR calc non Af Amer: 84 mL/min — ABNORMAL LOW (ref >90)
Glucose, Bld: 100 mg/dL — ABNORMAL HIGH (ref 70–99)
POTASSIUM: 4.3 meq/L (ref 3.7–5.3)
Sodium: 140 mEq/L (ref 137–147)

## 2014-01-29 LAB — URINALYSIS, ROUTINE W REFLEX MICROSCOPIC
Bilirubin Urine: NEGATIVE
Glucose, UA: NEGATIVE mg/dL
HGB URINE DIPSTICK: NEGATIVE
Ketones, ur: NEGATIVE mg/dL
Nitrite: NEGATIVE
PROTEIN: NEGATIVE mg/dL
SPECIFIC GRAVITY, URINE: 1.026 (ref 1.005–1.030)
UROBILINOGEN UA: 0.2 mg/dL (ref 0.0–1.0)
pH: 5 (ref 5.0–8.0)

## 2014-01-29 LAB — TYPE AND SCREEN
ABO/RH(D): O POS
Antibody Screen: NEGATIVE

## 2014-01-29 LAB — SURGICAL PCR SCREEN
MRSA, PCR: NEGATIVE
STAPHYLOCOCCUS AUREUS: NEGATIVE

## 2014-01-29 LAB — PROTIME-INR
INR: 0.94 (ref 0.00–1.49)
Prothrombin Time: 12.4 seconds (ref 11.6–15.2)

## 2014-01-29 LAB — URINE MICROSCOPIC-ADD ON

## 2014-01-29 LAB — APTT: aPTT: 30 seconds (ref 24–37)

## 2014-01-30 NOTE — Progress Notes (Signed)
Anesthesia Chart Review:  Patient is a 48 year old female scheduled for right THA on 02/05/14 by Dr. Turner Daniels. She underwent left THA on 11/24/13.  See my note.  Her asthma regimen was adjusted by Dr. Linford Arnold on 01/16/14. Pre-operative labs noted. UA showed moderate leukocytes, few bacteria, but negative nitrites.  Results called to [facility] at Dr. Wadie Lessen office.  She tolerated THA earlier this year.  If no acute changes or acute asthma exacerbations then I would anticipate that she could proceed as planned.  Velna Ochs [facility] Short Stay Center/Anesthesiology Phone [phone] 01/30/2014 10:22 AM

## 2014-02-02 NOTE — H&P (Signed)
TOTAL HIP ADMISSION H&P  Patient is admitted for right total hip arthroplasty.  Subjective:  Chief Complaint: right hip pain  HPI: Robin [name], 48 y.o. female, has a history of pain and functional disability in the right hip(s) due to arthritis and patient has failed non-surgical conservative treatments for greater than 12 weeks to include NSAID's and/or analgesics, flexibility and strengthening excercises, supervised PT with diminished ADL's post treatment and activity modification.  Onset of symptoms was gradual starting >10 years ago with gradually worsening course since that time.The patient noted no past surgery on the right hip(s).  Patient currently rates pain in the right hip at 10 out of 10 with activity. Patient has night pain, worsening of pain with activity and weight bearing, pain that interfers with activities of daily living and pain with passive range of motion. Patient has evidence of subchondral cysts and joint space narrowing by imaging studies. This condition presents safety issues increasing the risk of falls.   There is no current active infection.  Patient Active Problem List   Diagnosis Date Noted  . Arthritis of left hip 11/23/2013  . Asthma 06/02/2012  . Degenerative arthritis of hip 06/02/2012   Past Medical History  Diagnosis Date  . Hyperlipidemia     borderline no meds required  . Shortness of breath     lying/sitting/exertion  . Asthma     Flonase,Singulair,Advair daily  . COPD (chronic obstructive pulmonary disease)     uses Albuterol daily as needed  . History of bronchitis     last time 3518yrs ago  . Convulsion     as a baby   . Arthritis   . Joint pain   . Joint swelling   . Dysphagia   . History of kidney stones   . GERD (gastroesophageal reflux disease)     Past Surgical History  Procedure Laterality Date  . Lumbar laminectomy  1994    L4-5  . Cholecystectomy  2002  . Ercp    . Total hip arthroplasty Left 11/24/2013    Procedure: LEFT  TOTAL HIP ARTHROPLASTY;  Surgeon: Nestor LewandowskyFrank J Rowan, MD;  Location: [facility] OR;  Service: Orthopedics;  Laterality: Left;    No prescriptions prior to admission   Allergies  Allergen Reactions  . Fluticasone Other (See Comments)    Headache (tolerates Advair with no reaction)     History  Substance Use Topics  . Smoking status: Former Smoker -- 1.00 packs/day for 25 years    Types: Cigarettes    Quit date: 01/30/2004  . Smokeless tobacco: Not on file     Comment: quit 8-6880yrs ago  . Alcohol Use: No    Family History  Problem Relation Age of Onset  . Alcohol abuse Brother   . Alcohol abuse Sister   . Heart attack Father   . Diabetes      grandparents.      Review of Systems  Constitutional: Negative.   HENT: Negative.   Eyes: Negative.   Respiratory:       Asthma  Cardiovascular: Negative.   Gastrointestinal: Negative.   Genitourinary: Negative.   Musculoskeletal: Positive for joint pain.  Skin: Negative.   Neurological: Negative.   Endo/Heme/Allergies: Negative.   Psychiatric/Behavioral: Negative.     Objective:  Physical Exam  Constitutional: She is oriented to person, place, and time. She appears well-developed and well-nourished.  HENT:  Head: Normocephalic and atraumatic.  Eyes: Pupils are equal, round, and reactive to light.  Neck: Normal range of  motion. Neck supple.  Cardiovascular: Intact distal pulses.   Respiratory: Effort normal.  Musculoskeletal: She exhibits tenderness.  Patient's right hip has severe limitation of internal rotation and this does cause increased pain and discomfort.  Patient has brisk capillary refill and is neurovascularly intact distally.  Neurological: She is alert and oriented to person, place, and time.  Skin: Skin is warm and dry.  Psychiatric: She has a normal mood and affect. Her behavior is normal. Judgment normal.    Vital signs in last 24 hours:    Labs:   Estimated body mass index is 40.59 kg/(m^2) as calculated from  the following:   Height as of 11/26/13: 5\' 2"  (1.575 m).   Weight as of 12/21/13: 100.699 kg (222 lb).   Imaging Review Radiographs:  X-rays were ordered, performed, and interpreted by me today included; AP pelvis and crosstable lateral show bilateral neck shaft angles approaching 150 with relatively shallow acetabula he and no superior articular cartilage.  There are subchondral cysts in the Sorceil of the acetabulum.  Assessment/Plan:  End stage arthritis, right hip(s)  The patient history, physical examination, clinical judgement of the provider and imaging studies are consistent with end stage degenerative joint disease of the right hip(s) and total hip arthroplasty is deemed medically necessary. The treatment options including medical management, injection therapy, arthroscopy and arthroplasty were discussed at length. The risks and benefits of total hip arthroplasty were presented and reviewed. The risks due to aseptic loosening, infection, stiffness, dislocation/subluxation,  thromboembolic complications and other imponderables were discussed.  The patient acknowledged the explanation, agreed to proceed with the plan and consent was signed. Patient is being admitted for inpatient treatment for surgery, pain control, PT, OT, prophylactic antibiotics, VTE prophylaxis, progressive ambulation and ADL's and discharge planning.The patient is planning to be discharged home with home health services

## 2014-02-04 MED ORDER — CEFAZOLIN SODIUM-DEXTROSE 2-3 GM-% IV SOLR
2.0000 g | INTRAVENOUS | Status: AC
  Administered 2014-02-05: 2 g via INTRAVENOUS
  Filled 2014-02-04: qty 50

## 2014-02-05 ENCOUNTER — Encounter (HOSPITAL_COMMUNITY): Payer: BC Managed Care – PPO | Admitting: Vascular Surgery

## 2014-02-05 ENCOUNTER — Inpatient Hospital Stay (HOSPITAL_COMMUNITY)
Admission: RE | Admit: 2014-02-05 | Discharge: 2014-02-06 | DRG: 470 | Disposition: A | Discharge status: Home Health | Payer: BC Managed Care – PPO | Source: Ambulatory Visit | Attending: Orthopedic Surgery | Admitting: Orthopedic Surgery

## 2014-02-05 ENCOUNTER — Encounter (HOSPITAL_COMMUNITY): Payer: Self-pay | Admitting: *Deleted

## 2014-02-05 ENCOUNTER — Inpatient Hospital Stay (HOSPITAL_COMMUNITY): Payer: BC Managed Care – PPO

## 2014-02-05 ENCOUNTER — Inpatient Hospital Stay (HOSPITAL_COMMUNITY): Payer: BC Managed Care – PPO | Admitting: Certified Registered"

## 2014-02-05 ENCOUNTER — Encounter (HOSPITAL_COMMUNITY)
Admission: RE | Disposition: A | Discharge status: Home Health | Payer: Self-pay | Source: Ambulatory Visit | Attending: Orthopedic Surgery

## 2014-02-05 DIAGNOSIS — Z6841 Body Mass Index (BMI) 40.0 and over, adult: Secondary | ICD-10-CM

## 2014-02-05 DIAGNOSIS — M1611 Unilateral primary osteoarthritis, right hip: Secondary | ICD-10-CM | POA: Diagnosis present

## 2014-02-05 DIAGNOSIS — M1610 Unilateral primary osteoarthritis, unspecified hip: Principal | ICD-10-CM | POA: Diagnosis present

## 2014-02-05 DIAGNOSIS — M169 Osteoarthritis of hip, unspecified: Principal | ICD-10-CM | POA: Diagnosis present

## 2014-02-05 DIAGNOSIS — J4489 Other specified chronic obstructive pulmonary disease: Secondary | ICD-10-CM | POA: Diagnosis present

## 2014-02-05 DIAGNOSIS — E669 Obesity, unspecified: Secondary | ICD-10-CM | POA: Diagnosis present

## 2014-02-05 DIAGNOSIS — J449 Chronic obstructive pulmonary disease, unspecified: Secondary | ICD-10-CM | POA: Diagnosis present

## 2014-02-05 DIAGNOSIS — K219 Gastro-esophageal reflux disease without esophagitis: Secondary | ICD-10-CM | POA: Diagnosis present

## 2014-02-05 DIAGNOSIS — Z87891 Personal history of nicotine dependence: Secondary | ICD-10-CM

## 2014-02-05 DIAGNOSIS — E785 Hyperlipidemia, unspecified: Secondary | ICD-10-CM | POA: Diagnosis present

## 2014-02-05 HISTORY — PX: TOTAL HIP ARTHROPLASTY: SHX124

## 2014-02-05 SURGERY — ARTHROPLASTY, HIP, TOTAL,POSTERIOR APPROACH
Anesthesia: General | Site: Hip | Laterality: Right

## 2014-02-05 MED ORDER — HYDROMORPHONE HCL PF 1 MG/ML IJ SOLN
INTRAMUSCULAR | Status: AC
Start: 2014-02-05 — End: 2014-02-05
  Filled 2014-02-05: qty 1

## 2014-02-05 MED ORDER — HYDROMORPHONE HCL PF 1 MG/ML IJ SOLN: 1.0000 mg | INTRAMUSCULAR | Status: DC | PRN

## 2014-02-05 MED ORDER — ONDANSETRON HCL 4 MG/2ML IJ SOLN: 4.0000 mg | Freq: Four times a day (QID) | INTRAMUSCULAR | Status: DC | PRN

## 2014-02-05 MED ORDER — BUPIVACAINE-EPINEPHRINE (PF) 0.5% -1:200000 IJ SOLN
INTRAMUSCULAR | Status: AC
  Filled 2014-02-05: qty 30

## 2014-02-05 MED ORDER — METHOCARBAMOL 500 MG PO TABS: 500.0000 mg | ORAL_TABLET | Freq: Two times a day (BID) | ORAL | Status: DC

## 2014-02-05 MED ORDER — BISACODYL 5 MG PO TBEC: 5.0000 mg | DELAYED_RELEASE_TABLET | Freq: Every day | ORAL | Status: DC | PRN

## 2014-02-05 MED ORDER — OXYCODONE HCL 5 MG PO TABS
5.0000 mg | ORAL_TABLET | ORAL | Status: DC | PRN
  Administered 2014-02-05 – 2014-02-06 (×6): 10 mg via ORAL
  Filled 2014-02-05 (×6): qty 2

## 2014-02-05 MED ORDER — MAGNESIUM CITRATE PO SOLN: 1.0000 | Freq: Once | ORAL | Status: AC | PRN

## 2014-02-05 MED ORDER — OXYCODONE HCL 5 MG PO TABS
5.0000 mg | ORAL_TABLET | Freq: Once | ORAL | Status: AC | PRN
  Administered 2014-02-05: 5 mg via ORAL

## 2014-02-05 MED ORDER — OXYCODONE-ACETAMINOPHEN 5-325 MG PO TABS: 1.0000 | ORAL_TABLET | Freq: Four times a day (QID) | ORAL | Status: DC | PRN

## 2014-02-05 MED ORDER — FENTANYL CITRATE 0.05 MG/ML IJ SOLN
INTRAMUSCULAR | Status: DC | PRN
  Administered 2014-02-05 (×2): 50 ug via INTRAVENOUS
  Administered 2014-02-05: 150 ug via INTRAVENOUS

## 2014-02-05 MED ORDER — TRANEXAMIC ACID 100 MG/ML IV SOLN
1000.0000 mg | INTRAVENOUS | Status: AC
  Administered 2014-02-05: 1000 mg via INTRAVENOUS
  Filled 2014-02-05: qty 10

## 2014-02-05 MED ORDER — BUPIVACAINE-EPINEPHRINE 0.5% -1:200000 IJ SOLN
INTRAMUSCULAR | Status: DC | PRN
  Administered 2014-02-05: 20 mL

## 2014-02-05 MED ORDER — METOCLOPRAMIDE HCL 5 MG/ML IJ SOLN: 10.0000 mg | Freq: Once | INTRAMUSCULAR | Status: DC | PRN

## 2014-02-05 MED ORDER — NEOSTIGMINE METHYLSULFATE 10 MG/10ML IV SOLN
INTRAVENOUS | Status: AC
  Filled 2014-02-05: qty 1

## 2014-02-05 MED ORDER — LIDOCAINE HCL (CARDIAC) 20 MG/ML IV SOLN
INTRAVENOUS | Status: AC
Start: 2014-02-05 — End: 2014-02-05
  Filled 2014-02-05: qty 5

## 2014-02-05 MED ORDER — MIDAZOLAM HCL 2 MG/2ML IJ SOLN
INTRAMUSCULAR | Status: AC
  Filled 2014-02-05: qty 2

## 2014-02-05 MED ORDER — METHOCARBAMOL 1000 MG/10ML IJ SOLN
500.0000 mg | Freq: Four times a day (QID) | INTRAVENOUS | Status: DC | PRN
  Filled 2014-02-05: qty 5

## 2014-02-05 MED ORDER — ALBUTEROL SULFATE (2.5 MG/3ML) 0.083% IN NEBU: 3.0000 mL | INHALATION_SOLUTION | RESPIRATORY_TRACT | Status: DC | PRN

## 2014-02-05 MED ORDER — ACETAMINOPHEN 650 MG RE SUPP: 650.0000 mg | Freq: Four times a day (QID) | RECTAL | Status: DC | PRN

## 2014-02-05 MED ORDER — OXYCODONE HCL 5 MG/5ML PO SOLN: 5.0000 mg | Freq: Once | ORAL | Status: AC | PRN

## 2014-02-05 MED ORDER — DIPHENHYDRAMINE HCL 12.5 MG/5ML PO ELIX: 12.5000 mg | ORAL_SOLUTION | ORAL | Status: DC | PRN

## 2014-02-05 MED ORDER — PANTOPRAZOLE SODIUM 40 MG PO TBEC
40.0000 mg | DELAYED_RELEASE_TABLET | Freq: Every day | ORAL | Status: DC
  Administered 2014-02-06: 40 mg via ORAL
  Filled 2014-02-05: qty 1

## 2014-02-05 MED ORDER — ROCURONIUM BROMIDE 50 MG/5ML IV SOLN
INTRAVENOUS | Status: AC
  Filled 2014-02-05: qty 1

## 2014-02-05 MED ORDER — LACTATED RINGERS IV SOLN
INTRAVENOUS | Status: DC | PRN
  Administered 2014-02-05 (×2): via INTRAVENOUS

## 2014-02-05 MED ORDER — DOCUSATE SODIUM 100 MG PO CAPS
100.0000 mg | ORAL_CAPSULE | Freq: Two times a day (BID) | ORAL | Status: DC
  Administered 2014-02-05 – 2014-02-06 (×2): 100 mg via ORAL
  Filled 2014-02-05 (×3): qty 1

## 2014-02-05 MED ORDER — ROCURONIUM BROMIDE 100 MG/10ML IV SOLN
INTRAVENOUS | Status: DC | PRN
  Administered 2014-02-05: 50 mg via INTRAVENOUS

## 2014-02-05 MED ORDER — HYDROMORPHONE HCL PF 1 MG/ML IJ SOLN
INTRAMUSCULAR | Status: AC
Start: 2014-02-05 — End: 2014-02-05
  Administered 2014-02-05: 1 mg
  Filled 2014-02-05: qty 1

## 2014-02-05 MED ORDER — NEOSTIGMINE METHYLSULFATE 10 MG/10ML IV SOLN
INTRAVENOUS | Status: DC | PRN
  Administered 2014-02-05: 3 mg via INTRAVENOUS

## 2014-02-05 MED ORDER — SENNOSIDES-DOCUSATE SODIUM 8.6-50 MG PO TABS: 1.0000 | ORAL_TABLET | Freq: Every evening | ORAL | Status: DC | PRN

## 2014-02-05 MED ORDER — ALBUTEROL SULFATE (2.5 MG/3ML) 0.083% IN NEBU: 2.5000 mg | INHALATION_SOLUTION | Freq: Four times a day (QID) | RESPIRATORY_TRACT | Status: DC | PRN

## 2014-02-05 MED ORDER — METHOCARBAMOL 500 MG PO TABS
500.0000 mg | ORAL_TABLET | Freq: Four times a day (QID) | ORAL | Status: DC | PRN
  Administered 2014-02-05 – 2014-02-06 (×3): 500 mg via ORAL
  Filled 2014-02-05 (×3): qty 1

## 2014-02-05 MED ORDER — PROPOFOL 10 MG/ML IV BOLUS
INTRAVENOUS | Status: AC
  Filled 2014-02-05: qty 20

## 2014-02-05 MED ORDER — DEXTROSE-NACL 5-0.45 % IV SOLN: INTRAVENOUS | Status: DC

## 2014-02-05 MED ORDER — METOCLOPRAMIDE HCL 5 MG/ML IJ SOLN
INTRAMUSCULAR | Status: DC | PRN
  Administered 2014-02-05: 10 mg via INTRAVENOUS

## 2014-02-05 MED ORDER — GLYCOPYRROLATE 0.2 MG/ML IJ SOLN
INTRAMUSCULAR | Status: DC | PRN
  Administered 2014-02-05: 0.4 mg via INTRAVENOUS

## 2014-02-05 MED ORDER — HYDROMORPHONE HCL PF 1 MG/ML IJ SOLN
INTRAMUSCULAR | Status: AC
  Filled 2014-02-05: qty 1

## 2014-02-05 MED ORDER — ONDANSETRON HCL 4 MG/2ML IJ SOLN
INTRAMUSCULAR | Status: DC | PRN
  Administered 2014-02-05: 4 mg via INTRAVENOUS

## 2014-02-05 MED ORDER — OMEPRAZOLE MAGNESIUM 20 MG PO TBEC: 20.0000 mg | DELAYED_RELEASE_TABLET | Freq: Every day | ORAL | Status: DC

## 2014-02-05 MED ORDER — ONDANSETRON HCL 4 MG/2ML IJ SOLN
INTRAMUSCULAR | Status: AC
  Filled 2014-02-05: qty 2

## 2014-02-05 MED ORDER — ACETAMINOPHEN 325 MG PO TABS
650.0000 mg | ORAL_TABLET | Freq: Four times a day (QID) | ORAL | Status: DC | PRN
  Administered 2014-02-05 – 2014-02-06 (×2): 650 mg via ORAL
  Filled 2014-02-05 (×2): qty 2

## 2014-02-05 MED ORDER — METOCLOPRAMIDE HCL 10 MG PO TABS: 5.0000 mg | ORAL_TABLET | Freq: Three times a day (TID) | ORAL | Status: DC | PRN

## 2014-02-05 MED ORDER — ONDANSETRON HCL 4 MG PO TABS: 4.0000 mg | ORAL_TABLET | Freq: Four times a day (QID) | ORAL | Status: DC | PRN

## 2014-02-05 MED ORDER — GLYCOPYRROLATE 0.2 MG/ML IJ SOLN
INTRAMUSCULAR | Status: AC
  Filled 2014-02-05: qty 2

## 2014-02-05 MED ORDER — ASPIRIN EC 325 MG PO TBEC: 325.0000 mg | DELAYED_RELEASE_TABLET | Freq: Two times a day (BID) | ORAL | Status: DC

## 2014-02-05 MED ORDER — METOCLOPRAMIDE HCL 5 MG/ML IJ SOLN: 5.0000 mg | Freq: Three times a day (TID) | INTRAMUSCULAR | Status: DC | PRN

## 2014-02-05 MED ORDER — SODIUM CHLORIDE 0.9 % IR SOLN
Status: DC | PRN
  Administered 2014-02-05: 1000 mL

## 2014-02-05 MED ORDER — METOCLOPRAMIDE HCL 5 MG/ML IJ SOLN
INTRAMUSCULAR | Status: AC
  Filled 2014-02-05: qty 2

## 2014-02-05 MED ORDER — ASPIRIN EC 325 MG PO TBEC
325.0000 mg | DELAYED_RELEASE_TABLET | Freq: Every day | ORAL | Status: DC
  Administered 2014-02-06: 325 mg via ORAL
  Filled 2014-02-05 (×2): qty 1

## 2014-02-05 MED ORDER — LIDOCAINE HCL (CARDIAC) 20 MG/ML IV SOLN
INTRAVENOUS | Status: DC | PRN
Start: 2014-02-05 — End: 2014-02-05
  Administered 2014-02-05: 100 mg via INTRAVENOUS

## 2014-02-05 MED ORDER — VECURONIUM BROMIDE 10 MG IV SOLR
INTRAVENOUS | Status: DC | PRN
  Administered 2014-02-05 (×2): 2 mg via INTRAVENOUS

## 2014-02-05 MED ORDER — PROPOFOL 10 MG/ML IV BOLUS
INTRAVENOUS | Status: DC | PRN
  Administered 2014-02-05: 200 mg via INTRAVENOUS

## 2014-02-05 MED ORDER — MOMETASONE FURO-FORMOTEROL FUM 100-5 MCG/ACT IN AERO
2.0000 | INHALATION_SPRAY | Freq: Two times a day (BID) | RESPIRATORY_TRACT | Status: DC
  Administered 2014-02-05 – 2014-02-06 (×2): 2 via RESPIRATORY_TRACT
  Filled 2014-02-05: qty 8.8

## 2014-02-05 MED ORDER — MONTELUKAST SODIUM 10 MG PO TABS
10.0000 mg | ORAL_TABLET | Freq: Every day | ORAL | Status: DC
  Administered 2014-02-05: 10 mg via ORAL
  Filled 2014-02-05 (×2): qty 1

## 2014-02-05 MED ORDER — MIDAZOLAM HCL 5 MG/5ML IJ SOLN
INTRAMUSCULAR | Status: DC | PRN
  Administered 2014-02-05: 2 mg via INTRAVENOUS

## 2014-02-05 MED ORDER — HYDROMORPHONE HCL PF 1 MG/ML IJ SOLN
0.2500 mg | INTRAMUSCULAR | Status: DC | PRN
  Administered 2014-02-05 (×2): 0.5 mg via INTRAVENOUS
  Administered 2014-02-05: 10:00:00 via INTRAVENOUS
  Administered 2014-02-05: 0.5 mg via INTRAVENOUS

## 2014-02-05 MED ORDER — SUCCINYLCHOLINE CHLORIDE 20 MG/ML IJ SOLN
INTRAMUSCULAR | Status: AC
  Filled 2014-02-05: qty 1

## 2014-02-05 MED ORDER — PHENOL 1.4 % MT LIQD: 1.0000 | OROMUCOSAL | Status: DC | PRN

## 2014-02-05 MED ORDER — MENTHOL 3 MG MT LOZG: 1.0000 | LOZENGE | OROMUCOSAL | Status: DC | PRN

## 2014-02-05 MED ORDER — DEXAMETHASONE SODIUM PHOSPHATE 4 MG/ML IJ SOLN
INTRAMUSCULAR | Status: AC
  Filled 2014-02-05: qty 2

## 2014-02-05 MED ORDER — KCL IN DEXTROSE-NACL 20-5-0.45 MEQ/L-%-% IV SOLN
INTRAVENOUS | Status: DC
  Administered 2014-02-05: 125 mL/h via INTRAVENOUS
  Filled 2014-02-05 (×5): qty 1000

## 2014-02-05 MED ORDER — CHLORHEXIDINE GLUCONATE 4 % EX LIQD: 60.0000 mL | Freq: Once | CUTANEOUS | Status: DC

## 2014-02-05 MED ORDER — OXYCODONE HCL 5 MG PO TABS
ORAL_TABLET | ORAL | Status: AC
Start: 2014-02-05 — End: 2014-02-05
  Filled 2014-02-05: qty 1

## 2014-02-05 MED ORDER — DEXAMETHASONE SODIUM PHOSPHATE 4 MG/ML IJ SOLN
INTRAMUSCULAR | Status: DC | PRN
  Administered 2014-02-05: 8 mg via INTRAVENOUS

## 2014-02-05 MED ORDER — FENTANYL CITRATE 0.05 MG/ML IJ SOLN
INTRAMUSCULAR | Status: AC
  Filled 2014-02-05: qty 5

## 2014-02-05 MED ORDER — METHOCARBAMOL 500 MG PO TABS
ORAL_TABLET | ORAL | Status: AC
Start: 2014-02-05 — End: 2014-02-05
  Filled 2014-02-05: qty 1

## 2014-02-05 SURGICAL SUPPLY — 53 items
BLADE SAW SGTL 18X1.27X75 (BLADE) ×2 IMPLANT
BLADE SAW SGTL 18X1.27X75MM (BLADE) ×1
BRUSH FEMORAL CANAL (MISCELLANEOUS) IMPLANT
CAPT HIP PF COP ×3 IMPLANT
COVER BACK TABLE 24X17X13 BIG (DRAPES) IMPLANT
COVER SURGICAL LIGHT HANDLE (MISCELLANEOUS) ×6 IMPLANT
DRAPE ORTHO SPLIT 77X108 STRL (DRAPES) ×2
DRAPE PROXIMA HALF (DRAPES) ×3 IMPLANT
DRAPE SURG ORHT 6 SPLT 77X108 (DRAPES) ×1 IMPLANT
DRAPE U-SHAPE 47X51 STRL (DRAPES) ×3 IMPLANT
DRILL BIT 7/64X5 (BIT) ×3 IMPLANT
DRSG AQUACEL AG ADV 3.5X10 (GAUZE/BANDAGES/DRESSINGS) ×3 IMPLANT
DURAPREP 26ML APPLICATOR (WOUND CARE) ×3 IMPLANT
ELECT BLADE 4.0 EZ CLEAN MEGAD (MISCELLANEOUS)
ELECT REM PT RETURN 9FT ADLT (ELECTROSURGICAL) ×3
ELECTRODE BLDE 4.0 EZ CLN MEGD (MISCELLANEOUS) IMPLANT
ELECTRODE REM PT RTRN 9FT ADLT (ELECTROSURGICAL) ×1 IMPLANT
GAUZE XEROFORM 1X8 LF (GAUZE/BANDAGES/DRESSINGS) ×3 IMPLANT
GLOVE BIO SURGEON STRL SZ7.5 (GLOVE) ×3 IMPLANT
GLOVE BIO SURGEON STRL SZ8.5 (GLOVE) ×6 IMPLANT
GLOVE BIOGEL PI IND STRL 8 (GLOVE) ×2 IMPLANT
GLOVE BIOGEL PI IND STRL 9 (GLOVE) ×1 IMPLANT
GLOVE BIOGEL PI INDICATOR 8 (GLOVE) ×4
GLOVE BIOGEL PI INDICATOR 9 (GLOVE) ×2
GOWN STRL REUS W/ TWL LRG LVL3 (GOWN DISPOSABLE) ×2 IMPLANT
GOWN STRL REUS W/ TWL XL LVL3 (GOWN DISPOSABLE) ×3 IMPLANT
GOWN STRL REUS W/TWL LRG LVL3 (GOWN DISPOSABLE) ×4
GOWN STRL REUS W/TWL XL LVL3 (GOWN DISPOSABLE) ×6
HANDPIECE INTERPULSE COAX TIP (DISPOSABLE)
HOOD PEEL AWAY FACE SHEILD DIS (HOOD) ×9 IMPLANT
KIT BASIN OR (CUSTOM PROCEDURE TRAY) ×3 IMPLANT
KIT ROOM TURNOVER OR (KITS) ×3 IMPLANT
MANIFOLD NEPTUNE II (INSTRUMENTS) ×3 IMPLANT
NEEDLE 22X1 1/2 (OR ONLY) (NEEDLE) ×3 IMPLANT
NS IRRIG 1000ML POUR BTL (IV SOLUTION) ×3 IMPLANT
PACK TOTAL JOINT (CUSTOM PROCEDURE TRAY) ×3 IMPLANT
PAD ARMBOARD 7.5X6 YLW CONV (MISCELLANEOUS) ×6 IMPLANT
PASSER SUT SWANSON 36MM LOOP (INSTRUMENTS) ×3 IMPLANT
PRESSURIZER FEMORAL UNIV (MISCELLANEOUS) IMPLANT
SET HNDPC FAN SPRY TIP SCT (DISPOSABLE) IMPLANT
SUT ETHIBOND 2 V 37 (SUTURE) ×3 IMPLANT
SUT VIC AB 0 CTB1 27 (SUTURE) ×3 IMPLANT
SUT VIC AB 1 CTX 36 (SUTURE) ×2
SUT VIC AB 1 CTX36XBRD ANBCTR (SUTURE) ×1 IMPLANT
SUT VIC AB 2-0 CTB1 (SUTURE) ×3 IMPLANT
SUT VIC AB 3-0 SH 27 (SUTURE) ×2
SUT VIC AB 3-0 SH 27X BRD (SUTURE) ×1 IMPLANT
SYR CONTROL 10ML LL (SYRINGE) ×3 IMPLANT
TOWEL OR 17X24 6PK STRL BLUE (TOWEL DISPOSABLE) ×3 IMPLANT
TOWEL OR 17X26 10 PK STRL BLUE (TOWEL DISPOSABLE) ×3 IMPLANT
TOWER CARTRIDGE SMART MIX (DISPOSABLE) IMPLANT
TRAY FOLEY CATH 14FR (SET/KITS/TRAYS/PACK) IMPLANT
WATER STERILE IRR 1000ML POUR (IV SOLUTION) ×12 IMPLANT

## 2014-02-05 NOTE — Evaluation (Signed)
Physical Therapy Evaluation Patient Details Name: Robin [name] MRN: [id number] DOB: 08-Aug-1966 Today'[name] Date: 02/05/2014   History of Present Illness  48 y.o. female [name]/p right THA with history of COPD and previous Left THA  in april 2015.  Clinical Impression  Pt is [name]/p right THA resulting in the deficits listed below (see PT Problem List). Anticipate she will progress very quickly and she reports would like to d/c tomorrow after stair training. Pt will benefit from skilled PT to increase their independence and safety with mobility to allow discharge to the venue listed below.       Follow Up Recommendations Home health PT;Supervision for mobility/OOB    Equipment Recommendations  None recommended by PT    Recommendations for Other Services OT consult     Precautions / Restrictions Precautions Precautions: Posterior Hip Precaution Booklet Issued: Yes (comment) Precaution Comments: Reviewed posterior hip precautions Restrictions Weight Bearing Restrictions: Yes RLE Weight Bearing: Weight bearing as tolerated      Mobility  Bed Mobility Overal bed mobility: Needs Assistance Bed Mobility: Supine to Sit     Supine to sit: Supervision;HOB elevated     General bed mobility comments: Supervision for safety with HOB elevated. VCs for technique, requires extra time.  Transfers Overall transfer level: Needs assistance Equipment used: Rolling walker (2 wheeled) Transfers: Sit to/from Stand Sit to Stand: Min guard         General transfer comment: Min guard for safety with VCs for hand placement. Leans on RW and needs cues to correct. Performed from lowest bed setting  Ambulation/Gait Ambulation/Gait assistance: Min guard;+2 safety/equipment Ambulation Distance (Feet): 30 Feet Assistive device: Rolling walker (2 wheeled) Gait Pattern/deviations: Step-to pattern;Decreased step length - left;Decreased stance time - right;Antalgic   Gait velocity interpretation: Below  normal speed for age/gender General Gait Details: Min guard for safety with second person to follow with chair however was not needed to sit during this ambulatory bout. VCs for forward gaze and gait seuencing.  Stairs            Wheelchair Mobility    Modified Rankin (Stroke Patients Only)       Balance                                             Pertinent Vitals/Pain 4/10 pain Patient repositioned in chair for comfort.     Home Living Family/patient expects to be discharged to:: Private residence Living Arrangements: Children;Other relatives Available Help at Discharge: Family;Available 24 hours/day Type of Home: House Home Access: Stairs to enter Entrance Stairs-Rails: None Entrance Stairs-Number of Steps: 1 small step Home Layout: One level Home Equipment: Cane - single point;Bedside commode;Hand held shower head;Shower seat;Walker - 2 wheels      Prior Function Level of Independence: Independent with assistive device([name])         Comments: Using cane prior to this surgery     Hand Dominance   Dominant Hand: Right    Extremity/Trunk Assessment   Upper Extremity Assessment: Defer to OT evaluation           Lower Extremity Assessment: RLE deficits/detail RLE Deficits / Details: Decreased strength and ROM       Communication   Communication: No difficulties  Cognition Arousal/Alertness: Awake/alert Behavior During Therapy: WFL for tasks assessed/performed Overall Cognitive Status: Within Functional Limits for tasks assessed  General Comments      Exercises Total Joint Exercises Ankle Circles/Pumps: AROM;Both;10 reps;Supine Quad Sets: AAROM;Both;10 reps;Supine Gluteal Sets: Strengthening;Both;10 reps;Seated      Assessment/Plan    PT Assessment Patient needs continued PT services  PT Diagnosis Difficulty walking;Abnormality of gait;Acute pain   PT Problem List Decreased  strength;Decreased range of motion;Decreased activity tolerance;Decreased balance;Decreased mobility;Decreased knowledge of use of DME;Obesity;Pain  PT Treatment Interventions DME instruction;Gait training;Stair training;Functional mobility training;Therapeutic activities;Therapeutic exercise;Balance training;Neuromuscular re-education;Patient/family education;Modalities   PT Goals (Current goals can be found in the Care Plan section) Acute Rehab PT Goals Patient Stated Goal: Be able to garden without pain PT Goal Formulation: With patient Time For Goal Achievement: 02/12/14 Potential to Achieve Goals: Good    Frequency 7X/week   Barriers to discharge        Co-evaluation               End of Session Equipment Utilized During Treatment: Gait belt Activity Tolerance: Patient tolerated treatment well Patient left: in chair;with call bell/phone within reach;with family/visitor present Nurse Communication: Mobility status         Time: [id number] PT Time Calculation (min): 24 min   Charges:   PT Evaluation $Initial PT Evaluation Tier I: 1 Procedure PT Treatments $Gait Training: 8-22 mins   PT G Codes:        [employer][name] WholesaleLogan [name] Robin [name], [place]PT [phone]    Robin [place], Robin [name] 02/05/2014, 3:57 PM

## 2014-02-05 NOTE — Op Note (Signed)
OPERATIVE REPORT    DATE OF PROCEDURE:  02/05/2014       PREOPERATIVE DIAGNOSIS:  RIGHT HIP OSTEOARTHRITIS/DEVELOPEMENTAL HIP DYSPLASIA                                                          POSTOPERATIVE DIAGNOSIS:  RIGHT HIP OSTEOARTHRITIS/DEVELOPEMENTAL HIP DYSPLASIA                                                           PROCEDURE:  r total hip arthroplasty using a 52 mm DePuy Pinnacle  Cup, Pe[employer]ody [employer] Hole Eliminator, 10-degree polyethylene liner index superior  and posterior, a +0 36 mm ceramic head, a 18x13x160x42 SROM stem, 18Dsm Sleeve   SURGEON: Vernis Eid J    ASSISTANT:   Eric K. [employer] PA-C  (present throughout entire procedure and necessary for timely completion of the procedure)   ANESTHESIA: General BLOOD LOSS: 400 FLUID REPLACEMENT: 1800 crystalloid  COMPLICATIONS: none    INDICATIONS FOR PROCEDURE: A 48 y.o. year-old With  RIGHT HIP OSTEOARTHRITIS/DEVELOPEMENTAL HIP DYSPLASIA   for 5 years, x-rays show bone-on-bone arthritic changes. Despite conservative measures with observation, anti-inflammatory medicine, narcotics, use of a cane, has severe unremitting pain and can ambulate only a few blocks before resting.  Patient desires elective R total hip arthroplasty to decrease pain and increase function. The risks, benefits, and alternatives were discussed at length including but not limited to the risks of infection, bleeding, nerve injury, stiffness, blood clots, the need for revision surgery, cardiopulmonary complications, among others, and they were willing to proceed. Questions answered     PROCEDURE IN DETAIL: The patient was identified by armband,  [facility], taken to the operating room , appropriate anesthetic monitors  were attached and general endotracheal anesthesia induced. Foley catheter was inserted. Pt was rolled into the L lateral decubitus position and fixed there with a Stulberg Mark  II pelvic clamp.  The R lower extremity was then prepped and draped  in the usual sterile fashion from the ankle to the hemipelvis. A time-out  procedure was performed. The skin along the lateral hip and thigh  infiltrated with 10 mL of 0.5% Marcaine and epinephrine solution. We  then made a posterolateral approach to the hip. With a #10 blade, a 24 cm  incision was made through the skin and subcutaneous tissue down to the level of the  IT band. Small bleeders were identified and cauterized. The IT band was cut in  line with skin incision exposing the greater trochanter. A Cobra retractor was placed between the gluteus minimus and the superior hip joint capsule, and a spiked Cobra between the quadratus femoris and the inferior hip joint capsule. This isolated the short  external rotators and piriformis tendons. These were tagged with a #2 Ethibond  suture and cut off their insertion on the intertrochanteric crest. The posterior  capsule was then developed into an acetabular-based flap from Posterior Superior off of the acetabulum out over the femoral neck and back posterior inferior to the acetabular rim. This flap was tagged with two #2  Ethibond sutures and retracted protecting the sciatic nerve. This exposed the arthritic femoral head and osteophytes. The hip was then flexed and internally rotated, dislocating the femoral head and a standard neck cut performed 1 fingerbreadth above the lesser trochanter.  A spiked Cobra was placed in the cotyloid notch and a Hohmann retractor was then used to lever the femur anteriorly off of the anterior pelvic column. A posterior-inferior wing retractor was placed at the junction of the acetabulum and the ischium completing the acetabular exposure.We then removed the peripheral osteophytes and labrum from the acetabulum. We then reamed the acetabulum up to 51 mm with basket reamers obtaining good coverage in all quadrants. We then irrigated with normal  saline  solution and hammered into place a 52 mm pinnacle cup in 45  degrees of abduction and about 20 degrees of anteversion. More  peripheral osteophytes removed and a trial 10-degree liner placed with the  index superior-posterior. The hip was then flexed and internally rotated exposing the  proximal femur, which was entered with the initiating reamer followed by  the axial reamers up to a 13.5 mm full depth and 14mm partial depth. We then conically reamed to 18Dsm to the correct depth for a 42 base neck. The calcar was milled to 18Dsm. A trial cone and stem was inserted in the 25 degrees anteversion, with a +0 36mm trial head. Trial reduction was then performed and excellent stability was noted with at 90 of flexion with 75 of internal rotation and then full extension with maximal external rotation. The hip could not be dislocated in full extension. The knee could easily flex  to about 130 degrees. We also stretched the abductors at this point,  because of the preexisting adductor contractures. All trial components  were then removed. The acetabulum was irrigated out with normal saline  solution. A titanium [facility] Eliminator was then screwed into place  followed by a 10-degree polyethylene liner index superior-posterior. On  the femoral side a 18Dsm ZTT1 sleeve was hammered into place, followed by a 18x13x42x160 SROM stem in 25 degrees of anteversion. At this point, a +0 36 mm ceramic head was  hammered on the stem. The hip was reduced. We checked our stability  one more time and found it to be excellent. The wound was once again  thoroughly irrigated out with normal saline solution pulse lavage. The  capsular flap and short external rotators were repaired back to the  intertrochanteric crest through drill holes with a #2 Ethibond suture.  The IT band was closed with running 1 Vicryl suture. The subcutaneous  tissue with 0 and 2-0 undyed Vicryl suture and the skin with running  interlocking 3-0  nylon suture. Dressing of Xeroform and Mepilex was  then applied. The patient was then unclamped, rolled supine, awaken extubated and taken to recovery room without difficulty in stable condition.   Robin [name] J 02/05/2014, 8:51 AM

## 2014-02-05 NOTE — Anesthesia Preprocedure Evaluation (Addendum)
Anesthesia Evaluation  Patient identified by MRN, date of birth, ID band Patient awake    Reviewed: Allergy & Precautions, H&P , NPO status , Patient's Chart, lab work & pertinent test results, reviewed documented beta blocker date and time   Airway Mallampati: II TM Distance: >3 FB Neck ROM: full    Dental  (+) Teeth Intact, Dental Advisory Given   Pulmonary shortness of breath and with exertion, asthma , COPD COPD inhaler, former smoker,  breath sounds clear to auscultation        Cardiovascular negative cardio ROS  Rhythm:regular     Neuro/Psych Seizures -,  negative psych ROS   GI/Hepatic Neg liver ROS, GERD-  Medicated and Controlled,  Endo/Other  Morbid obesity  Renal/GU negative Renal ROS  negative genitourinary   Musculoskeletal   Abdominal   Peds  Hematology negative hematology ROS (+)   Anesthesia Other Findings See surgeon's H&P   Reproductive/Obstetrics negative OB ROS                          Anesthesia Physical Anesthesia Plan  ASA: III  Anesthesia Plan: General   Post-op Pain Management:    Induction: Intravenous  Airway Management Planned: Oral ETT  Additional Equipment:   Intra-op Plan:   Post-operative Plan: Extubation in OR  Informed Consent: I have reviewed the patients History and Physical, chart, labs and discussed the procedure including the risks, benefits and alternatives for the proposed anesthesia with the patient or authorized representative who has indicated his/her understanding and acceptance.   Dental Advisory Given  Plan Discussed with: CRNA, Surgeon and Anesthesiologist  Anesthesia Plan Comments:        Anesthesia Quick Evaluation

## 2014-02-05 NOTE — Care Management Note (Signed)
TOTAL HIP ADMISSION H&P   Patient is admitted for right total hip arthroplasty.   Subjective:   Chief Complaint: right hip pain   HPI: Robin [name], 48 y.o. female, has a history of pain and functional disability in the right hip(s) due to arthritis and patient has failed non-surgical conservative treatments for greater than 12 weeks to include NSAID's and/or analgesics, flexibility and strengthening excercises, supervised PT with diminished ADL's post treatment and activity modification.  Onset of symptoms was gradual starting >10 years ago with gradually worsening course since that time.The patient noted no past surgery on the right hip(s).  Patient currently rates pain in the right hip at 10 out of 10 with activity. Patient has night pain, worsening of pain with activity and weight bearing, pain that interfers with activities of daily living and pain with passive range of motion. Patient has evidence of subchondral cysts and joint space narrowing by imaging studies. This condition presents safety issues increasing the risk of falls.   There is no current active infection.    Patient Active Problem List     Diagnosis  Date Noted   .  Arthritis of left hip  11/23/2013   .  Asthma  06/02/2012   .  Degenerative arthritis of hip  06/02/2012    Past Medical History   Diagnosis  Date   .  Hyperlipidemia         borderline no meds required   .  Shortness of breath         lying/sitting/exertion   .  Asthma         Flonase,Singulair,Advair daily   .  COPD (chronic obstructive pulmonary disease)         uses Albuterol daily as needed   .  History of bronchitis         last time 5554yrs ago   .  Convulsion         as a baby    .  Arthritis     .  Joint pain     .  Joint swelling     .  Dysphagia     .  History of kidney stones     .  GERD (gastroesophageal reflux disease)        Past Surgical History   Procedure  Laterality  Date   .  Lumbar laminectomy    1994       L4-5   .   Cholecystectomy    2002   .  Ercp       .  Total hip arthroplasty  Left  11/24/2013       Procedure: LEFT TOTAL HIP ARTHROPLASTY;  Surgeon: Nestor LewandowskyFrank J Rowan, MD;  Location: [facility] OR;  Service: Orthopedics;  Laterality: Left;      No prescriptions prior to admission    Allergies   Allergen  Reactions   .  Fluticasone  Other (See Comments)       Headache (tolerates Advair with no reaction)       History   Substance Use Topics   .  Smoking status:  Former Smoker -- 1.00 packs/day for 25 years       Types:  Cigarettes       Quit date:  01/30/2004   .  Smokeless tobacco:  Not on file         Comment: quit 8-6463yrs ago   .  Alcohol Use:  No      Family History  Problem  Relation  Age of Onset   .  Alcohol abuse  Brother     .  Alcohol abuse  Sister     .  Heart attack  Father     .  Diabetes           grandparents.       Review of Systems  Constitutional: Negative.   HENT: Negative.   Eyes: Negative.   Respiratory:        Asthma  Cardiovascular: Negative.   Gastrointestinal: Negative.   Genitourinary: Negative.   Musculoskeletal: Positive for joint pain.  Skin: Negative.   Neurological: Negative.   Endo/Heme/Allergies: Negative.   Psychiatric/Behavioral: Negative.     Objective:   Physical Exam  Constitutional: She is oriented to person, place, and time. She appears well-developed and well-nourished.  HENT:   Head: Normocephalic and atraumatic.  Eyes: Pupils are equal, round, and reactive to light.  Neck: Normal range of motion. Neck supple.  Cardiovascular: Intact distal pulses.   Respiratory: Effort normal.  Musculoskeletal: She exhibits tenderness.  Patient's right hip has severe limitation of internal rotation and this does cause increased pain and discomfort.  Patient has brisk capillary refill and is neurovascularly intact distally.  Neurological: She is alert and oriented to person, place, and time.  Skin: Skin is warm and dry.  Psychiatric: She has a normal  mood and affect. Her behavior is normal. Judgment normal.    Vital signs in last 24 hours:   Labs:     Estimated body mass index is 40.59 kg/(m^2) as calculated from the following:   Height as of 11/26/13: 5\' 2"  (1.575 m).   Weight as of 12/21/13: 100.699 kg (222 lb).     Imaging Review Radiographs:  X-rays were ordered, performed, and interpreted by me today included; AP pelvis and crosstable lateral show bilateral neck shaft angles approaching 150 with relatively shallow acetabula he and no superior articular cartilage.  There are subchondral cysts in the Sorceil of the acetabulum.   Assessment/Plan:   End stage arthritis, right hip(s)   The patient history, physical examination, clinical judgement of the provider and imaging studies are consistent with end stage degenerative joint disease of the right hip(s) and total hip arthroplasty is deemed medically necessary. The treatment options including medical management, injection therapy, arthroscopy and arthroplasty were discussed at length. The risks and benefits of total hip arthroplasty were presented and reviewed. The risks due to aseptic loosening, infection, stiffness, dislocation/subluxation,  thromboembolic complications and other imponderables were discussed.  The patient acknowledged the explanation, agreed to proceed with the plan and consent was signed. Patient is being admitted for inpatient treatment for surgery, pain control, PT, OT, prophylactic antibiotics, VTE prophylaxis, progressive ambulation and ADL's and discharge planning.The patient is planning to be discharged home with home health services  Cosigned by: Nestor LewandowskyFrank J Rowan, MD    [02/04/2014 6:36 AM]  Routing History...     Date/Time From To Method   02/04/2014 6:36 AM Nestor LewandowskyFrank J Rowan, MD Allena KatzEric K Phillips, PA-C Fax

## 2014-02-05 NOTE — Progress Notes (Signed)
OT Cancellation Note  Patient Details Name: Vara GuardianColleen Wyly MRN: [id number] DOB: 1965/11/25   Cancelled Treatment:    Reason Eval/Treat Not Completed: OT screened, no needs identified, will sign off.  Spoke with pt and she had prior hip surgery on other hip in April 2015. No OT concerns at this time.  Earlie RavelingStraub, Wilson Dusenbery L OTR/L [phone] 02/05/2014, 4:54 PM

## 2014-02-05 NOTE — Transfer of Care (Signed)
Immediate Anesthesia Transfer of Care Note  Patient: Robin [name]  Procedure(s) Performed: Procedure(s): RIGHT TOTAL HIP ARTHROPLASTY (Right)  Patient Location: PACU  Anesthesia Type:General  Level of Consciousness: awake, alert  and oriented  Airway & Oxygen Therapy: Patient Spontanous Breathing and Patient connected to nasal cannula oxygen  Post-op Assessment: Report given to PACU RN, Post -op Vital signs reviewed and stable and Patient moving all extremities  Post vital signs: Reviewed and stable  Complications: No apparent anesthesia complications

## 2014-02-05 NOTE — Progress Notes (Signed)
Report given to robin rn as caregiver 

## 2014-02-05 NOTE — Anesthesia Postprocedure Evaluation (Signed)
Anesthesia Post Note  Patient: Robin [name]  Procedure(s) Performed: Procedure(s) (LRB): RIGHT TOTAL HIP ARTHROPLASTY (Right)  Anesthesia type: General  Patient location: PACU  Post pain: Pain level controlled  Post assessment: Patient's Cardiovascular Status Stable  Last Vitals:  Filed Vitals:   02/05/14 1015  BP: 124/74  Pulse: 80  Temp:   Resp:     Post vital signs: Reviewed and stable  Level of consciousness: alert  Complications: No apparent anesthesia complications

## 2014-02-05 NOTE — Anesthesia Procedure Notes (Signed)
Procedure Name: Intubation Date/Time: 02/05/2014 7:31 AM Performed by: Charm BargesBUTLER, Makayla Lanter R Pre-anesthesia Checklist: Patient identified, Emergency Drugs available, Suction available, Patient being monitored and Timeout performed Patient Re-evaluated:Patient Re-evaluated prior to inductionOxygen Delivery Method: Circle system utilized Preoxygenation: Pre-oxygenation with 100% oxygen Intubation Type: IV induction Ventilation: Mask ventilation without difficulty Laryngoscope Size: 3 and Mac Grade View: Grade I Tube type: Oral Tube size: 7.5 mm Number of attempts: 1 Airway Equipment and Method: Stylet Placement Confirmation: ETT inserted through vocal cords under direct vision,  positive ETCO2 and breath sounds checked- equal and bilateral Secured at: 22 cm Tube secured with: Tape Dental Injury: Teeth and Oropharynx as per pre-operative assessment

## 2014-02-05 NOTE — Interval H&P Note (Signed)
History and Physical Interval Note:  02/05/2014 7:14 AM  Robin [name]  has presented today for surgery, with the diagnosis of RIGHT HIP OSTEOARTHRITIS/DEVELOPEMENTAL HIP DYSPLASIA  The various methods of treatment have been discussed with the patient and family. After consideration of risks, benefits and other options for treatment, the patient has consented to  Procedure(s): RIGHT TOTAL HIP ARTHROPLASTY (Right) as a surgical intervention .  The patient's history has been reviewed, patient examined, no change in status, stable for surgery.  I have reviewed the patient's chart and labs.  Questions were answered to the patient's satisfaction.     Nestor LewandowskyOWAN,Laterrance Nauta J

## 2014-02-05 NOTE — Addendum Note (Signed)
Addendum created 02/05/14 1415 by Shireen Quanavid R Janis Sol, CRNA   Modules edited: Anesthesia Blocks and Procedures, Clinical Notes   Clinical Notes:  File: [id number]

## 2014-02-06 ENCOUNTER — Encounter (HOSPITAL_COMMUNITY): Payer: Self-pay | Admitting: Orthopedic Surgery

## 2014-02-06 LAB — CBC
HCT: 34.7 % — ABNORMAL LOW (ref 36.0–46.0)
Hemoglobin: 11.1 g/dL — ABNORMAL LOW (ref 12.0–15.0)
MCH: 27.9 pg (ref 26.0–34.0)
MCHC: 32 g/dL (ref 30.0–36.0)
MCV: 87.2 fL (ref 78.0–100.0)
PLATELETS: 301 10*3/uL (ref 150–400)
RBC: 3.98 MIL/uL (ref 3.87–5.11)
RDW: 13.2 % (ref 11.5–15.5)
WBC: 10.8 10*3/uL — ABNORMAL HIGH (ref 4.0–10.5)

## 2014-02-06 LAB — BASIC METABOLIC PANEL
BUN: 14 mg/dL (ref 6–23)
CALCIUM: 9.4 mg/dL (ref 8.4–10.5)
CO2: 27 mEq/L (ref 19–32)
Chloride: 100 mEq/L (ref 96–112)
Creatinine, Ser: 0.78 mg/dL (ref 0.50–1.10)
GFR calc Af Amer: >90 mL/min (ref >90)
Glucose, Bld: 119 mg/dL — ABNORMAL HIGH (ref 70–99)
Potassium: 4.7 mEq/L (ref 3.7–5.3)
SODIUM: 140 meq/L (ref 137–147)

## 2014-02-06 NOTE — Progress Notes (Signed)
Patient ID: Robin [name], female   DOB: [birth year], 48 y.o.   MRN: [id number] PATIENT ID: Robin GuardianColleen Lobb  MRN: [id number]  DOB/AGE:  [birth year] 48 y.o.  1 Day Post-Op Procedure(s) (LRB): RIGHT TOTAL HIP ARTHROPLASTY (Right)    PROGRESS NOTE Subjective: Patient is alert, oriented, no Nausea, no Vomiting, yes passing gas, no Bowel Movement. Taking PO well. Denies SOB, Chest or Calf Pain. Using Incentive Spirometer, PAS in place. Ambulate WBAT Patient reports pain as 2 on 0-10 scale  .    Objective: Vital signs in last 24 hours: Filed Vitals:   02/05/14 1109 02/05/14 1337 02/05/14 2102 02/06/14 0551  BP:  118/61 119/61 122/72  Pulse:  75 82 78  Temp:  97.8 F (36.6 C) 98.2 F (36.8 C) 98.3 F (36.8 C)  TempSrc:      Resp:  18 18 18   Height: 5\' 2"  (1.575 m)     Weight: 101.152 kg (223 lb)     SpO2:  96% 93% 95%      Intake/Output from previous day: I/O last 3 completed shifts: In: 1900 [P.O.:600; I.V.:1300] Out: -    Intake/Output this shift:     LABORATORY DATA:  Recent Labs  02/06/14 0615  WBC 10.8*  HGB 11.1*  HCT 34.7*  PLT 301  NA 140  K 4.7  CL 100  CO2 27  BUN 14  CREATININE 0.78  GLUCOSE 119*  CALCIUM 9.4    Examination: Neurologically intact ABD soft Neurovascular intact Sensation intact distally Intact pulses distally Dorsiflexion/Plantar flexion intact Incision: dressing C/D/I No cellulitis present Compartment soft} XR AP&Lat of hip shows well placed\fixed THA  Assessment:   1 Day Post-Op Procedure(s) (LRB): RIGHT TOTAL HIP ARTHROPLASTY (Right) ADDITIONAL DIAGNOSIS:  obesity  Plan: PT/OT WBAT, THA  posterior precautions  DVT Prophylaxis: SCDx72 hrs, ASA 325 mg BID x 2 weeks  DISCHARGE PLAN: Home,The patient may go home today if she passes physical therapy goals  DISCHARGE NEEDS: HHPT, HHRN, CPM, Walker and 3-in-1 comode seat

## 2014-02-06 NOTE — Discharge Summary (Signed)
Patient ID: Robin GuardianColleen Mohler MRN: [id number] DOB/AGE: 24-Apr-1966 48 y.o.  Admit date: 02/05/2014 Discharge date: 02/06/2014  Admission Diagnoses:  Principal Problem:   Arthritis of right hip   Discharge Diagnoses:  Same  Past Medical History  Diagnosis Date  . Hyperlipidemia     borderline no meds required  . Shortness of breath     lying/sitting/exertion  . Asthma     Flonase,Singulair,Advair daily  . COPD (chronic obstructive pulmonary disease)     uses Albuterol daily as needed  . History of bronchitis     last time 10766yrs ago  . Convulsion     as a baby   . Arthritis   . Joint pain   . Joint swelling   . Dysphagia   . History of kidney stones   . GERD (gastroesophageal reflux disease)     Surgeries: Procedure(s): RIGHT TOTAL HIP ARTHROPLASTY on 02/05/2014   Consultants:    Discharged Condition: Improved  Hospital Course: Robin [name] is an 48 y.o. female who was admitted 02/05/2014 for operative treatment ofArthritis of right hip. Patient has severe unremitting pain that affects sleep, daily activities, and work/hobbies. After pre-op clearance the patient was taken to the operating room on 02/05/2014 and underwent  Procedure(s): RIGHT TOTAL HIP ARTHROPLASTY.    Patient was given perioperative antibiotics: Anti-infectives   Start     Dose/Rate Route Frequency Ordered Stop   02/05/14 0600  ceFAZolin (ANCEF) IVPB 2 g/50 mL premix     2 g 100 mL/hr over 30 Minutes Intravenous On call to O.R. 02/04/14 1243 02/05/14 0720       Patient was given sequential compression devices, early ambulation, and chemoprophylaxis to prevent DVT.Patient ambulated the evening of surgery.  Weightbearing as tolerated.  Her contralateral left total hip is performing well also.  Patient benefited maximally from hospital stay and there were no complications.    Recent vital signs: Patient Vitals for the past 24 hrs:  BP Temp Pulse Resp SpO2 Height Weight  02/06/14 0551 122/72 mmHg 98.3 F  (36.8 C) 78 18 95 % - -  02/05/14 2102 119/61 mmHg 98.2 F (36.8 C) 82 18 93 % - -  02/05/14 1337 118/61 mmHg 97.8 F (36.6 C) 75 18 96 % - -  02/05/14 1109 - - - - - 5\' 2"  (1.575 m) 101.152 kg (223 lb)  02/05/14 1041 129/73 mmHg 97.9 F (36.6 C) 90 18 92 % - -  02/05/14 1015 124/74 mmHg 97.6 F (36.4 C) 80 - - - -  02/05/14 1000 120/72 mmHg - 82 18 95 % - -  02/05/14 0945 128/75 mmHg - 77 16 95 % - -  02/05/14 0930 123/41 mmHg 97.7 F (36.5 C) 78 16 98 % - -     Recent laboratory studies:  Recent Labs  02/06/14 0615  WBC 10.8*  HGB 11.1*  HCT 34.7*  PLT 301  NA 140  K 4.7  CL 100  CO2 27  BUN 14  CREATININE 0.78  GLUCOSE 119*  CALCIUM 9.4     Discharge Medications:     Medication List         acetaminophen 650 MG CR tablet  Commonly known as:  TYLENOL  Take 1,300 mg by mouth 2 (two) times daily as needed (arthritis pain).     albuterol (2.5 MG/3ML) 0.083% nebulizer solution  Commonly known as:  PROVENTIL  Take 3 mLs (2.5 mg total) by nebulization every 6 (six) hours as needed for wheezing.  albuterol 108 (90 BASE) MCG/ACT inhaler  Commonly known as:  PROAIR HFA  Inhale 2 puffs into the lungs every 4 (four) hours as needed for wheezing or shortness of breath.     aspirin EC 325 MG tablet  Take 1 tablet (325 mg total) by mouth 2 (two) times daily.     Fluticasone-Salmeterol 250-50 MCG/DOSE Aepb  Commonly known as:  ADVAIR  Inhale 1 puff into the lungs 2 (two) times daily.     methocarbamol 500 MG tablet  Commonly known as:  ROBAXIN  Take 1 tablet (500 mg total) by mouth 2 (two) times daily with a meal.     montelukast 10 MG tablet  Commonly known as:  SINGULAIR  Take 1 tablet (10 mg total) by mouth at bedtime.     omeprazole 20 MG tablet  Commonly known as:  PRILOSEC OTC  Take 20 mg by mouth daily.     OVER THE COUNTER MEDICATION  Apply 1 application topically at bedtime. "Two old goats cream" for arthritis pain     oxyCODONE-acetaminophen  5-325 MG per tablet  Commonly known as:  PERCOCET/ROXICET  Take 1-2 tablets by mouth every 6 (six) hours as needed (pain).        Diagnostic Studies: Dg Pelvis Portable  02/05/2014   CLINICAL DATA:  Postop right total hip  EXAM: PORTABLE PELVIS 1-2 VIEWS  COMPARISON:  11/24/2013  FINDINGS: Right hip replacement in satisfactory position alignment. No fracture or complication.  Left hip replacement previously performed, unremarkable.  IMPRESSION: Satisfactory right hip replacement.   Electronically Signed   By: Marlan Palauharles  Clark M.D.   On: 02/05/2014 10:57    Disposition: 06-[facility]Health Care Svc,[facility] passing [facility] therapy goals.      Discharge Instructions   Call MD / Call 911    Complete by:  As directed   If you experience chest pain or shortness of breath, CALL 911 and be transported to the hospital emergency room.  If you develope a fever above 101 F, pus (white drainage) or increased drainage or redness at the wound, or calf pain, call your surgeon's office.     Change dressing    Complete by:  As directed   You may change your dressing on POD #5     Constipation Prevention    Complete by:  As directed   Drink plenty of fluids.  Prune juice may be helpful.  You may use a stool softener, such as Colace (over the counter) 100 mg twice a day.  Use MiraLax (over the counter) for constipation as needed.     Diet - low sodium heart healthy    Complete by:  As directed      Driving restrictions    Complete by:  As directed   No driving for 2 weeks     Follow the hip precautions as taught in Physical Therapy    Complete by:  As directed      Increase activity slowly as tolerated    Complete by:  As directed      Patient may shower    Complete by:  As directed   You may shower without a dressing once there is no drainage.  Do not wash over the wound.  If drainage remains, cover wound with plastic wrap and then shower.           Followup: Gean BirchwoodFrank Rowan,  M.D. In 2 weeks [address] [place], [place] [id number] [phone]   Signed: Gean BirchwoodOWAN,FRANK  J 02/06/2014, 7:59 AM

## 2014-02-06 NOTE — Progress Notes (Signed)
Physical Therapy Treatment Patient Details Name: Vara GuardianColleen Huelsmann MRN: [id number] DOB: November 15, 1965 Today's Date: 02/06/2014    History of Present Illness 48 y.o. female s/p right THA with history of COPD and previous Left THA  in april 2015.    PT Comments    Patient making great progress. Able to carry over info from previous surgery. Able to complete step training with no issue. Very eager to go home this morning.   Follow Up Recommendations  Home health PT;Supervision for mobility/OOB     Equipment Recommendations  None recommended by PT    Recommendations for Other Services       Precautions / Restrictions Precautions Precautions: Posterior Hip Precaution Comments: Reviewed posterior hip precautions Restrictions Weight Bearing Restrictions: Yes RLE Weight Bearing: Weight bearing as tolerated    Mobility  Bed Mobility               General bed mobility comments: Patient up in recliner upon entering room  Transfers Overall transfer level: Modified independent                  Ambulation/Gait Ambulation/Gait assistance: Supervision Ambulation Distance (Feet): 300 Feet Assistive device: Rolling walker (2 wheeled) Gait Pattern/deviations: Step-through pattern;Decreased stride length Gait velocity: decreased   General Gait Details: Patient with good and safe use of RW. Ambulated with step through pattern   Stairs            Wheelchair Mobility    Modified Rankin (Stroke Patients Only)       Balance                                    Cognition Arousal/Alertness: Awake/alert Behavior During Therapy: WFL for tasks assessed/performed Overall Cognitive Status: Within Functional Limits for tasks assessed                      Exercises Total Joint Exercises Hip ABduction/ADduction: AROM;Right;10 reps Long Arc Quad: AROM;Right;10 reps    General Comments        Pertinent Vitals/Pain no apparent distress      Home Living                      Prior Function            PT Goals (current goals can now be found in the care plan section) Progress towards PT goals: Progressing toward goals    Frequency  7X/week    PT Plan Current plan remains appropriate    Co-evaluation             End of Session Equipment Utilized During Treatment: Gait belt Activity Tolerance: Patient tolerated treatment well Patient left: in chair;with call bell/phone within reach;with family/visitor present     Time: 4098-11911007-1031 PT Time Calculation (min): 24 min  Charges:  $Gait Training: 8-22 mins $Therapeutic Exercise: 8-22 mins                    G Codes:      Fredrich BirksRobinette, Julia Elizabeth 02/06/2014, 11:13 AM 02/06/2014 Fredrich Birksobinette, Julia Elizabeth PTA [phone] pager [phone] office

## 2014-02-07 NOTE — Care Management Note (Signed)
CARE MANAGEMENT NOTE 02/07/2014  Patient:  Robin [name] GuardianBAKER,Chari   Account Number:  [id number]  Date Initiated:  02/05/2014  Documentation initiated by:  Vance PeperBRADY,SUSAN  Subjective/Objective Assessment:   48 yr old female s/p right total hip arthroplasty.     Action/Plan:   [facility]  No changes. Has family support at discharge.   Anticipated DC Date:  02/06/2014   Anticipated DC Plan:  HOME W HOME HEALTH SERVICES      DC Planning Services  CM consult      [facility]  DURABLE MEDICAL EQUIPMENT   Choice offered to / List presented to:     DME arranged  3-N-1  [facility]      DME agency  [employer]     HH arranged  HH-2 PT      [facility]  [facility]   Status of service:  Completed, signed off Medicare Important Message given?   (If response is "NO", the following Medicare IM given date fields will be blank) Date Medicare IM given:   Date Additional Medicare IM given:    Discharge Disposition:  HOME W HOME HEALTH SERVICES  Per UR Regulation:  Reviewed for med. necessity/level of care/duration of stay  If discussed at Long Length of Stay Meetings, dates discussed:    Comments:

## 2014-03-28 ENCOUNTER — Other Ambulatory Visit: Payer: Self-pay | Admitting: Family Medicine

## 2014-04-25 ENCOUNTER — Other Ambulatory Visit: Payer: Self-pay | Admitting: Family Medicine

## 2014-06-21 ENCOUNTER — Other Ambulatory Visit: Payer: Self-pay | Admitting: Family Medicine

## 2014-07-17 ENCOUNTER — Encounter: Payer: Self-pay | Admitting: Family Medicine

## 2014-07-17 ENCOUNTER — Ambulatory Visit (INDEPENDENT_AMBULATORY_CARE_PROVIDER_SITE_OTHER): Payer: Commercial Managed Care - HMO | Admitting: Family Medicine

## 2014-07-17 VITALS — BP 121/73 | HR 80 | Temp 98.5°F | Ht 62.0 in | Wt 220.0 lb

## 2014-07-17 DIAGNOSIS — J4551 Severe persistent asthma with (acute) exacerbation: Secondary | ICD-10-CM

## 2014-07-17 MED ORDER — FLUTICASONE-SALMETEROL 500-50 MCG/DOSE IN AEPB
1.0000 | INHALATION_SPRAY | Freq: Two times a day (BID) | RESPIRATORY_TRACT | Status: DC
Start: 2014-07-17 — End: 2015-04-12

## 2014-07-17 MED ORDER — ALBUTEROL SULFATE HFA 108 (90 BASE) MCG/ACT IN AERS: INHALATION_SPRAY | RESPIRATORY_TRACT | Status: DC

## 2014-07-17 MED ORDER — FLUTICASONE-SALMETEROL 250-50 MCG/DOSE IN AEPB: 1.0000 | INHALATION_SPRAY | Freq: Two times a day (BID) | RESPIRATORY_TRACT | Status: DC

## 2014-07-17 MED ORDER — PREDNISONE 20 MG PO TABS: 40.0000 mg | ORAL_TABLET | Freq: Every day | ORAL | Status: DC

## 2014-07-17 NOTE — Progress Notes (Signed)
   Subjective:    Patient ID: Robin [name], female    DOB: [birth year], 48 y.o.   MRN: [id number]  HPI Here for follow-up asthma. She's currently using Advair discus 250. She is also on Singulair. She has been using her rescue inhaler daily for the last 6- 8 weeks.  No fever, chills. Has had a cough for 2 days. Feels like can't get a deep breath and has had left side pain.  Went to the ED and had a normal CXR and CT.  Has woken up SOB a few times.    Review of Systems     Objective:   Physical Exam  Constitutional: She is oriented to person, place, and time. She appears well-developed and well-nourished.  HENT:  Head: Normocephalic and atraumatic.  Right Ear: External ear normal.  Left Ear: External ear normal.  Nose: Nose normal.  Mouth/Throat: Oropharynx is clear and moist.  TMs and canals are clear.   Eyes: Conjunctivae and EOM are normal. Pupils are equal, round, and reactive to light.  Neck: Neck supple. No thyromegaly present.  Cardiovascular: Normal rate, regular rhythm and normal heart sounds.   Pulmonary/Chest: Effort normal and breath sounds normal. She has no wheezes.  Lymphadenopathy:    She has no cervical adenopathy.  Neurological: She is alert and oriented to person, place, and time.  Skin: Skin is warm and dry.  Psychiatric: She has a normal mood and affect.          Assessment & Plan:  Asthma - severe persistant.  Will tx with 5 days of prednisone. In the green zone in the office.  Increase Advair to 500mg  BID.     F/U in 6 weeks.

## 2014-08-28 ENCOUNTER — Encounter: Payer: Self-pay | Admitting: Family Medicine

## 2014-08-28 ENCOUNTER — Ambulatory Visit (INDEPENDENT_AMBULATORY_CARE_PROVIDER_SITE_OTHER): Payer: Commercial Managed Care - HMO | Admitting: *Deleted

## 2014-08-28 ENCOUNTER — Ambulatory Visit (INDEPENDENT_AMBULATORY_CARE_PROVIDER_SITE_OTHER): Payer: Commercial Managed Care - HMO | Admitting: Family Medicine

## 2014-08-28 VITALS — BP 137/74 | HR 80 | Ht 62.0 in | Wt 236.0 lb

## 2014-08-28 DIAGNOSIS — J4530 Mild persistent asthma, uncomplicated: Secondary | ICD-10-CM

## 2014-08-28 DIAGNOSIS — Z9289 Personal history of other medical treatment: Secondary | ICD-10-CM

## 2014-08-28 DIAGNOSIS — Z23 Encounter for immunization: Secondary | ICD-10-CM

## 2014-08-28 NOTE — Progress Notes (Signed)
   Subjective:    Patient ID: Robin [name], female    DOB: 1965/09/13, 49 y.o.   MRN: [id number]  HPI Here for 6 week follow-up for asthma. When I saw her 6 weeks ago she was having an exacerbation. She was RE onset Advair 250. We increased this to Advair 500 and gave her a short course of prednisone. Unfortunately she didn't get the Advari 500mg  until about  week ago. She's doing much better today. Using her albuterol a few times a week but not daily.  Not waking her up at night.  Still gets winding with walking distances.     Review of Systems     Objective:   Physical Exam  Constitutional: She is oriented to person, place, and time. She appears well-developed and well-nourished.  HENT:  Head: Normocephalic and atraumatic.  Cardiovascular: Normal rate, regular rhythm and normal heart sounds.   Pulmonary/Chest: Effort normal and breath sounds normal.  Neurological: She is alert and oriented to person, place, and time.  Skin: Skin is warm and dry.  Psychiatric: She has a normal mood and affect. Her behavior is normal.          Assessment & Plan:  Asthma, mild persistant - Plan on stayin gon Advair 500mg  for 2 months.  Work on weight gain. Work on diet and exercise.  She is starting silver sneakers. After 2 months if well controlled. Then will step down thereapy to Advair 250mg  and continue to progress dependind on sxs.   Due for screening mammo. Order placed today  Flu shot given today.

## 2014-10-29 ENCOUNTER — Ambulatory Visit: Payer: Commercial Managed Care - HMO | Admitting: Family Medicine

## 2014-11-07 ENCOUNTER — Other Ambulatory Visit: Payer: Self-pay | Admitting: Family Medicine

## 2015-01-15 ENCOUNTER — Other Ambulatory Visit: Payer: Self-pay | Admitting: *Deleted

## 2015-01-17 ENCOUNTER — Other Ambulatory Visit: Payer: Self-pay | Admitting: *Deleted

## 2015-01-17 DIAGNOSIS — J4541 Moderate persistent asthma with (acute) exacerbation: Secondary | ICD-10-CM

## 2015-01-17 MED ORDER — ALBUTEROL SULFATE (2.5 MG/3ML) 0.083% IN NEBU: 2.5000 mg | INHALATION_SOLUTION | Freq: Four times a day (QID) | RESPIRATORY_TRACT | Status: DC | PRN

## 2015-01-17 MED ORDER — ALBUTEROL SULFATE HFA 108 (90 BASE) MCG/ACT IN AERS: 2.0000 | INHALATION_SPRAY | RESPIRATORY_TRACT | Status: DC | PRN

## 2015-04-10 ENCOUNTER — Other Ambulatory Visit: Payer: Self-pay | Admitting: Family Medicine

## 2015-04-18 ENCOUNTER — Ambulatory Visit (INDEPENDENT_AMBULATORY_CARE_PROVIDER_SITE_OTHER): Payer: Commercial Managed Care - HMO | Admitting: Family Medicine

## 2015-04-18 ENCOUNTER — Encounter: Payer: Self-pay | Admitting: Family Medicine

## 2015-04-18 VITALS — BP 132/77 | HR 72 | Wt 243.0 lb

## 2015-04-18 DIAGNOSIS — Z23 Encounter for immunization: Secondary | ICD-10-CM | POA: Diagnosis not present

## 2015-04-18 DIAGNOSIS — J4551 Severe persistent asthma with (acute) exacerbation: Secondary | ICD-10-CM | POA: Diagnosis not present

## 2015-04-18 DIAGNOSIS — M160 Bilateral primary osteoarthritis of hip: Secondary | ICD-10-CM

## 2015-04-18 MED ORDER — PREDNISONE 10 MG (21) PO TBPK: ORAL_TABLET | ORAL | Status: DC

## 2015-04-18 MED ORDER — FLUTICASONE-SALMETEROL 250-50 MCG/DOSE IN AEPB: 1.0000 | INHALATION_SPRAY | Freq: Two times a day (BID) | RESPIRATORY_TRACT | Status: DC

## 2015-04-18 MED ORDER — MELOXICAM 7.5 MG PO TABS: 7.5000 mg | ORAL_TABLET | Freq: Every day | ORAL | Status: DC

## 2015-04-18 NOTE — Progress Notes (Signed)
Subjective:    Patient ID: Robin [name], female    DOB: 1966-03-22, 49 y.o.   MRN: [id number]  HPI Six-month follow-up for asthma. When I last saw her 6 months ago she was doing well and was categorized as mild persistent. He decided to stay on Advair for about 2 more months continue work on weight and diet and exercise. She had planned on starting silver sneakers. We discussed stepping down her Advair 500 therapy after couple months pending on how she was doing. She's now on the Advair 250/50. She's also on Singulair. And uses her albuterol as needed.  She  Has been using her albuterol almost daily.  + cough x couple of weeks.  No fever, chills.    She wants to know what her alternatives ibuprofen.. She had back surgery when she was younger and has had hip issues with recent hip replacement. She says she's taking ibuprofen about 4 times a day and is starting to really irritate her stomach. It feels much better when she doesn't take it. She does not tolerate tramadol well.  Review of Systems  BP 132/77 mmHg  Pulse 72  Wt 243 lb (110.224 kg)  SpO2 97%    Allergies  Allergen Reactions  . Fluticasone Other (See Comments)    Headache (tolerates Advair with no reaction)     Past Medical History  Diagnosis Date  . Hyperlipidemia     borderline no meds required  . Shortness of breath     lying/sitting/exertion  . Asthma     Flonase,Singulair,Advair daily  . COPD (chronic obstructive pulmonary disease)     uses Albuterol daily as needed  . History of bronchitis     last time 63yrs ago  . Convulsion     as a baby   . Arthritis   . Joint pain   . Joint swelling   . Dysphagia   . History of kidney stones   . GERD (gastroesophageal reflux disease)     Past Surgical History  Procedure Laterality Date  . Lumbar laminectomy  1994    L4-5  . Cholecystectomy  2002  . Ercp    . Total hip arthroplasty Left 11/24/2013    Procedure: LEFT TOTAL HIP ARTHROPLASTY;  Surgeon: Nestor Lewandowsky,  MD;  Location: [facility] OR;  Service: Orthopedics;  Laterality: Left;  . Total hip arthroplasty Right 02/05/2014    Procedure: RIGHT TOTAL HIP ARTHROPLASTY;  Surgeon: Nestor Lewandowsky, MD;  Location: [facility] OR;  Service: Orthopedics;  Laterality: Right;    Social History   Social History  . Marital Status: Married    Spouse Name: N/A  . Number of Children: 1  . Years of Education: N/A   Occupational History  . House Keeper    Social History Main Topics  . Smoking status: Former Smoker -- 1.00 packs/day for 25 years    Types: Cigarettes    Quit date: 01/30/2004  . Smokeless tobacco: Not on file     Comment: quit 8-45yrs ago  . Alcohol Use: No  . Drug Use: No  . Sexual Activity: Yes   Other Topics Concern  . Not on file   Social History Narrative   No regular exercise.     Family History  Problem Relation Age of Onset  . Alcohol abuse Brother   . Alcohol abuse Sister   . Heart attack Father   . Diabetes      grandparents.     Outpatient Encounter Prescriptions as  of 04/18/2015  Medication Sig  . albuterol (PROVENTIL) (2.5 MG/3ML) 0.083% nebulizer solution Take 3 mLs (2.5 mg total) by nebulization every 6 (six) hours as needed for wheezing.  Marland Kitchen albuterol (VENTOLIN HFA) 108 (90 BASE) MCG/ACT inhaler Inhale 2 puffs into the lungs every 4 (four) hours as needed for wheezing or shortness of breath.  . Fluticasone-Salmeterol (ADVAIR DISKUS) 250-50 MCG/DOSE AEPB Inhale 1 puff into the lungs 2 (two) times daily.  Marland Kitchen omeprazole (PRILOSEC OTC) 20 MG tablet Take 20 mg by mouth daily.  Marland Kitchen OVER THE COUNTER MEDICATION Apply 1 application topically at bedtime. "Two old goats cream" for arthritis pain  . [DISCONTINUED] Fluticasone-Salmeterol (ADVAIR DISKUS) 250-50 MCG/DOSE AEPB Inhale 1 puff into the lungs 2 (two) times daily. Patient needs to schedule a follow up appointment before more refills.  . meloxicam (MOBIC) 7.5 MG tablet Take 1-2 tablets (7.5-15 mg total) by mouth daily.  . predniSONE  (STERAPRED UNI-PAK 21 TAB) 10 MG (21) TBPK tablet Take po as directed.  . [DISCONTINUED] montelukast (SINGULAIR) 10 MG tablet Take 1 tablet (10 mg total) by mouth at bedtime.   No facility-administered encounter medications on file as of 04/18/2015.          Objective:   Physical Exam  Constitutional: She is oriented to person, place, and time. She appears well-developed and well-nourished.  HENT:  Head: Normocephalic and atraumatic.  Right Ear: External ear normal.  Left Ear: External ear normal.  Nose: Nose normal.  Mouth/Throat: Oropharynx is clear and moist.  TMs and canals are clear.   Eyes: Conjunctivae and EOM are normal. Pupils are equal, round, and reactive to light.  Neck: Neck supple. No thyromegaly present.  Cardiovascular: Normal rate, regular rhythm and normal heart sounds.   Pulmonary/Chest: Effort normal. She has wheezes.  Slight expiratory wheeze at the bases bilaterally.  Lymphadenopathy:    She has no cervical adenopathy.  Neurological: She is alert and oriented to person, place, and time.  Skin: Skin is warm and dry.  Psychiatric: She has a normal mood and affect.          Assessment & Plan:  Asthma, severe persistant with acute exacerbation. Will tx with prednisone. She prefers taper pack.  Call if not better in one week. Discussed increasing her Advair to the 500 dose again but she just filled the lower dose and doesn't want to spend the extra money. When she runs out then I will we can increase her dose. Just give [place] a call. Follow-up in 3-4 months.  Bilateral hip and back pain - will try meloxicam 7.5 mg once or twice a day. We'll see if this is a little bit more gentle on her stomach and if she still gets pain relief without having to take IV present in multiple times a day.  Flu vaccine given today.

## 2015-05-16 ENCOUNTER — Ambulatory Visit (INDEPENDENT_AMBULATORY_CARE_PROVIDER_SITE_OTHER): Payer: Commercial Managed Care - HMO | Admitting: Family Medicine

## 2015-05-16 ENCOUNTER — Encounter: Payer: Self-pay | Admitting: Family Medicine

## 2015-05-16 VITALS — BP 133/78 | HR 81 | Temp 99.0°F | Ht 62.0 in | Wt 245.0 lb

## 2015-05-16 DIAGNOSIS — R0602 Shortness of breath: Secondary | ICD-10-CM | POA: Diagnosis not present

## 2015-05-16 DIAGNOSIS — J209 Acute bronchitis, unspecified: Secondary | ICD-10-CM

## 2015-05-16 DIAGNOSIS — J4541 Moderate persistent asthma with (acute) exacerbation: Secondary | ICD-10-CM

## 2015-05-16 DIAGNOSIS — J0100 Acute maxillary sinusitis, unspecified: Secondary | ICD-10-CM

## 2015-05-16 MED ORDER — PREDNISONE 20 MG PO TABS: 40.0000 mg | ORAL_TABLET | Freq: Every day | ORAL | Status: DC

## 2015-05-16 MED ORDER — IPRATROPIUM-ALBUTEROL 0.5-2.5 (3) MG/3ML IN SOLN
3.0000 mL | Freq: Once | RESPIRATORY_TRACT | Status: AC
  Administered 2015-05-16: 3 mL via RESPIRATORY_TRACT

## 2015-05-16 MED ORDER — ALBUTEROL SULFATE (2.5 MG/3ML) 0.083% IN NEBU
2.5000 mg | INHALATION_SOLUTION | Freq: Once | RESPIRATORY_TRACT | Status: AC
  Administered 2015-05-16: 2.5 mg via RESPIRATORY_TRACT

## 2015-05-16 MED ORDER — AZITHROMYCIN 250 MG PO TABS: ORAL_TABLET | ORAL | Status: DC

## 2015-05-16 NOTE — Progress Notes (Signed)
   Subjective:    Patient ID: Robin [name], female    DOB: 01-Apr-1966, 49 y.o.   MRN: [id number]  HPI  Has had cough and HA x 3 weeks.  Using saline spray for her nause. Using tylenol for her HA.  Cough is productive but clear. Using her abluterol some but not today. No fever, chills, etc.  Right sided facial pain and some mild congestion with post nasal drip.  Mild ST. + nausea.  She i son Advair daily and Singulair. She has been using albuterol nebs at home. She's missed 2 days of work.  Review of Systems     Objective:   Physical Exam  Constitutional: She is oriented to person, place, and time. She appears well-developed and well-nourished.  HENT:  Head: Normocephalic and atraumatic.  Right Ear: External ear normal.  Left Ear: External ear normal.  Nose: Nose normal.  Mouth/Throat: Oropharynx is clear and moist.  TMs and canals are clear.   Eyes: Conjunctivae and EOM are normal. Pupils are equal, round, and reactive to light.  Neck: Neck supple. No thyromegaly present.  Cardiovascular: Normal rate, regular rhythm and normal heart sounds.   Pulmonary/Chest: Effort normal and breath sounds normal. She has no wheezes.  Lymphadenopathy:    She has no cervical adenopathy.  Neurological: She is alert and oriented to person, place, and time.  Skin: Skin is warm and dry.  Psychiatric: She has a normal mood and affect.          Assessment & Plan:  Acute sinusitis/bronchitis-will treat with azithromycin and start prednisone. Cough not significantly better in one week.  Asthma exacerbation- in yellow zone. Given 2 nebulizer treatments here in the office. Will start azithromycin and 5 day prednisone burst. Call if not better in one week.

## 2015-05-24 ENCOUNTER — Ambulatory Visit (INDEPENDENT_AMBULATORY_CARE_PROVIDER_SITE_OTHER): Payer: Commercial Managed Care - HMO

## 2015-05-24 ENCOUNTER — Encounter: Payer: Self-pay | Admitting: Osteopathic Medicine

## 2015-05-24 ENCOUNTER — Ambulatory Visit (INDEPENDENT_AMBULATORY_CARE_PROVIDER_SITE_OTHER): Payer: Commercial Managed Care - HMO | Admitting: Osteopathic Medicine

## 2015-05-24 VITALS — BP 132/88 | HR 88 | Temp 98.5°F | Ht 62.0 in | Wt 247.0 lb

## 2015-05-24 DIAGNOSIS — J209 Acute bronchitis, unspecified: Secondary | ICD-10-CM

## 2015-05-24 DIAGNOSIS — R0602 Shortness of breath: Secondary | ICD-10-CM

## 2015-05-24 DIAGNOSIS — J4541 Moderate persistent asthma with (acute) exacerbation: Secondary | ICD-10-CM

## 2015-05-24 MED ORDER — IPRATROPIUM-ALBUTEROL 0.5-2.5 (3) MG/3ML IN SOLN: 3.0000 mL | Freq: Four times a day (QID) | RESPIRATORY_TRACT | Status: DC

## 2015-05-24 MED ORDER — FLUTICASONE-SALMETEROL 500-50 MCG/DOSE IN AEPB: 1.0000 | INHALATION_SPRAY | Freq: Two times a day (BID) | RESPIRATORY_TRACT | Status: DC

## 2015-05-24 MED ORDER — IPRATROPIUM-ALBUTEROL 0.5-2.5 (3) MG/3ML IN SOLN
3.0000 mL | Freq: Once | RESPIRATORY_TRACT | Status: AC
  Administered 2015-05-24: 3 mL via RESPIRATORY_TRACT

## 2015-05-24 MED ORDER — DOXYCYCLINE HYCLATE 100 MG PO TABS: 100.0000 mg | ORAL_TABLET | Freq: Two times a day (BID) | ORAL | Status: DC

## 2015-05-24 MED ORDER — PREDNISONE 10 MG (48) PO TBPK: ORAL_TABLET | Freq: Every day | ORAL | Status: DC

## 2015-05-24 NOTE — Progress Notes (Signed)
HPI: Robin [name] is a 49 y.o. female who presents to [name]  today for chief complaint of:  Chief Complaint  Patient presents with  . Shortness of Breath    . Quality: asthma exacerbation  . Severity: marked . Duration: several weeks . Timing: constant . Context: recently seen by Dr Linford Arnold 1 week ago, started on Z-pack and prednisone  daily x 5 days. Compliant with Advair, needing albuterol.  . Assoc signs/symptoms: (+) cough, SOB   Past medical, social and family history reviewed: Past Medical History  Diagnosis Date  . Hyperlipidemia     borderline no meds required  . Shortness of breath     lying/sitting/exertion  . Asthma     Flonase,Singulair,Advair daily  . COPD (chronic obstructive pulmonary disease)     uses Albuterol daily as needed  . History of bronchitis     last time 38yrs ago  . Convulsion     as a baby   . Arthritis   . Joint pain   . Joint swelling   . Dysphagia   . History of kidney stones   . GERD (gastroesophageal reflux disease)    Past Surgical History  Procedure Laterality Date  . Lumbar laminectomy  1994    L4-5  . Cholecystectomy  2002  . Ercp    . Total hip arthroplasty Left 11/24/2013    Procedure: LEFT TOTAL HIP ARTHROPLASTY;  Surgeon: Nestor Lewandowsky, MD;  Location: [facility] OR;  Service: Orthopedics;  Laterality: Left;  . Total hip arthroplasty Right 02/05/2014    Procedure: RIGHT TOTAL HIP ARTHROPLASTY;  Surgeon: Nestor Lewandowsky, MD;  Location: [facility] OR;  Service: Orthopedics;  Laterality: Right;   Social History  Substance Use Topics  . Smoking status: Former Smoker -- 1.00 packs/day for 25 years    Types: Cigarettes    Quit date: 01/30/2004  . Smokeless tobacco: Not on file     Comment: quit 8-29yrs ago  . Alcohol Use: No   Family History  Problem Relation Age of Onset  . Alcohol abuse Brother   . Alcohol abuse Sister   . Heart attack Father   . Diabetes      grandparents.     Current  Outpatient Prescriptions  Medication Sig Dispense Refill  . albuterol (PROVENTIL) (2.5 MG/3ML) 0.083% nebulizer solution Take 3 mLs (2.5 mg total) by nebulization every 6 (six) hours as needed for wheezing. 75 mL 12  . albuterol (VENTOLIN HFA) 108 (90 BASE) MCG/ACT inhaler Inhale 2 puffs into the lungs every 4 (four) hours as needed for wheezing or shortness of breath. 9 each 4  . Fluticasone-Salmeterol (ADVAIR DISKUS) 250-50 MCG/DOSE AEPB Inhale 1 puff into the lungs 2 (two) times daily. 60 each 3  . meloxicam (MOBIC) 7.5 MG tablet Take 1-2 tablets (7.5-15 mg total) by mouth daily. 60 tablet 5  . omeprazole (PRILOSEC OTC) 20 MG tablet Take 20 mg by mouth daily.    Marland Kitchen OVER THE COUNTER MEDICATION Apply 1 application topically at bedtime. "Two old goats cream" for arthritis pain     No current facility-administered medications for this visit.   Allergies  Allergen Reactions  . Fluticasone Other (See Comments)    Headache (tolerates Advair with no reaction)       Review of Systems: CONSTITUTIONAL: Neg fever/chills, no unintentional weight changes CARDIAC: No chest pain/pressure/palpitations, RESPIRATORY: (+) cough/shortness of breath/wheeze GASTROINTESTINAL: No nausea/vomiting/abdominal pain/blood in stool/diarrhea/constipation MUSCULOSKELETAL: No myalgia/arthralgia   Exam:  BP 132/88  mmHg  Pulse 88  Temp(Src) 98.5 F (36.9 C) (Oral)  Wt 247 lb (112.038 kg)  SpO2 94% Constitutional: VSS, see above. General Appearance: alert, well-developed, well-nourished, NAD Ears, Nose, Mouth, Throat: Normal external inspection ears/nares/mouth/lips/gums, Normal TM bilaterally, MMM, posterior pharynx without erythema/exudate Neck: No masses, trachea midline.  Respiratory: Normal respiratory effort but difficulty speaking complete sentences. (+) diffuse wheeze bilateral, no rhonchi/rales Cardiovascular: S1/S2 normal, no murmur/rub/gallop auscultated. RRR.   No results found for this or any  previous visit (from the past 72 hour(s)).  Chest x-ray personally reviewed, see below. Rad overread no cpulm dz  Breathing treatment administered in office, wheezing persists though is improved  Peak flow administered in office, greater than 50% predicted PEF, although was close to 50.   ASSESSMENT/PLAN:  SOB (shortness of breath) - Plan: DG Chest 2 View. On personal review of x-ray, lungs appear clear, dense breast tissue somewhat obscures the lungs appear relatively clear on lateral view as well.No mass or discrete infiltrate. Positive lung markings consistent with possible inflamed airways versus normal vascular and bronchial lung markings. Reviewed with and explained to the patient. Let her know radiology will over read. Oxygen saturation is okay. Patient was given peak flow meter here,Sr. Was placed on the peak flow meter to indicate less than 50% PEF, patient advised to use peak flow meter 3-4 times per day over the course of this illness, and normally uses it twice a day,patient instructed on keeping record of her peak flows.Patient instructed that if she is persistently at less than 50% PEF she needs to be seen in the emergency department for treatment.Patient verbalizes understanding. PEF in yellow range here. Patient feels somewhat better after breathing treatment.  Asthma with acute exacerbation, moderate persistent - Plan: DG Chest 2 View, predniSONE (STERAPRED UNI-PAK 48 TAB) 10 MG (48) TBPK tablet, Fluticasone-Salmeterol (ADVAIR) 500-50 MCG/DOSE AEPB. We'll restart prednisone since patient's condition worsened when she was off this medicine, advised follow-up in 1 week prior to her prednisone running out. Depending on clinical picture at that point, may advise repeating spirometry which she has not had done in several years. Meantime increased dose inhaled corticosteroid, may taper down on this at later date depending on patient's progress/clinical picture  Acute bronchitis, unspecified  organism - Azithromycin did not seem to be helpful. Symptoms most likely attributed to severe asthma exacerbation, possible underlying viral bronchitis or less likely pneumonia. Plan: doxycycline (VIBRA-TABS) 100 MG tablet - most likely viral and patient doesn't want antibiotics but printed doxy in case pt doing worse over the weekend.

## 2015-05-24 NOTE — Patient Instructions (Signed)
Peak Flow Meter A peak flow meter is a device that helps you determine how well your asthma is being controlled and how well your lungs are working at any given point in time. For people with asthma, it is a simple but important tool to help with daily asthma management. Peak flow meters are available over-the-counter.  The readings from the meter will help you and your health care provider:   Determine the severity of your asthma.   Evaluate the effectiveness of your current treatment.   Determine when to add or stop certain medicines.   Recognize an asthma attack before signs or symptoms appear.   Decide when to seek emergency care.  RISKS AND COMPLICATIONS When you are using the peak flow meter, breathing too quickly may cause dizziness. At an extreme, this could cause you to pass out. Take your time so you do not get dizzy or light-headed.  HOW TO FIND YOUR PERSONAL BEST Your "personal best" is the highest peak flow rate you can reach when you feel good and have no asthma symptoms. This can be used as your baseline against which you can compare your peak flow meter readings. Because everyone's asthma is different, your personal best will be unique to you.  Your health care provider will help you figure out your personal best. Typically, you will take readings once or twice a day for 2 weeks when you are not having symptoms. The highest reading during the trial period is your personal best. Because your lung function can change over time, your personal best should be remeasured each year.  HOW TO USE YOUR PEAK FLOW METER  1. Move the upper marker to the number that is your personal best. (Done with sticker in the office) 2. Move the lower marker to the bottom of the numbered scale. (Done with sticker in the office) 3. Connect the mouthpiece to the peak flow meter.  4. Stand up.  5. Take a deep breath. Make sure you fill your lungs completely. 6. Place your lips tightly around the  mouthpiece. Blow as hard and as fast as you can with a single breath (forced exhalation), as if you are blowing out candles. If your lips are not placed tightly around the mouthpiece of the peak flow meter, you will get incorrect low readings. 7. At the end of your forced exhale, check to see what number the lower marker landed on. This is your peak flow rate. 8. Move the lower marker back to the bottom of the numbered scale. 9. Repeat these steps 2 more times. Record the highest reading of the 3 tries in your asthma diary. Do not calculate the average for your 3 tries, just record the highest.  Always write down the results in your asthma diary. After using your peak flow meter, rest and breathe slowly and easily. Keep a record of your progress. Your health care provider can provide you with a simple table to help with this. For the most accurate readings, it is important to keep your peak flow meter clean. Follow the manufacturer's instructions on how to take care of your peak flow meter.  Your health care provider will give you instructions on when to do regular monitoring. You may also need to check your peak flow when:   Coughing, wheezing, or other asthma symptoms wake you up at night.   Your asthma symptoms worsen during the day.   Your breathing is made worse because of a cold, flu, or other respiratory  illness.  You need to use your quick-relief, "rescue medicine." It is best to check your peak flow rate before you use rescue medicine, and then 20-30 minutes afterward. This will help determine whether you need to repeat taking the rescue medicine.  HOW TO USE YOUR RESULTS You and your health care provider can use color-coded zones on the meter to see how your peak flow rate compares to your personal best. The color code for each zone reflects progressively more severe symptoms:  Green Zone = Stable   Peak flow rate between 80% and 100% of your personal best. Your asthma is under good  control when your peak flow rate is in the green zone.   When you are in the green zone, you are not likely to be experiencing any asthma symptoms.   Only take your regular, preventive medicine. Your rescue medicine is not required.  Yellow Zone = Caution   Peak flow rate between 50% and 80% of your personal best. Your asthma is getting worse and could be improved.  You may have begun coughing, wheezing, or feeling chest tightness. Sometimes peak flow rates dip down into the yellow zone before asthma symptoms appear.  Consider increasing or changing your asthma medicine. This may include using your rescue medicine. If you have an asthma action plan, follow each step listed for the yellow zone, including medicine changes. Red Zone = Danger   Peak flow rate below 50% of your personal best. This may mean you are having, or are at risk of having, a medical emergency.  Your coughing, wheezing, or shortness of breath may have become severe. Do not wait to use your rescue medicine. Stop whatever you are doing and use your rescue inhaler, nebulizer, or other medicines to open your airways.  If you have an asthma action plan, follow each step listed for the red zone, including medicine changes and when to seek emergency medical care.  If your flow readings fall too far below your personal best into the yellow or red zone, you will need to take action to prevent or minimize an asthma attack.  SEEK MEDICAL CARE IF: You are in the yellow zone. If you have an asthma action plan, follow all of the steps listed under the yellow zone section of the plan. Let your health care provider know that you are in the yellow zone. SEEK IMMEDIATE MEDICAL CARE IF:  You are in the red zone. If you have an asthma action plan, follow all of the steps listed under the red zone section of the plan while you are seeking immediate medical care. Document Released: 06/07/2007 Document Revised: 12/25/2013 Document Reviewed:  01/26/2013 [facility] Patient Information 2015 [place], [place]. This information is not intended to replace advice given to you by your health care provider. Make sure you discuss any questions you have with your health care provider.

## 2015-05-29 IMAGING — CR DG CHEST 2V
2 series · 2 of 2 positions shown · non-contrast
Comparison: DG CHEST 2V dated 06/29/2011

CLINICAL DATA: Preop exam prior to hip replacement; history of COPD
and asthma

EXAM:
CHEST  2 VIEW

[w chest pa]
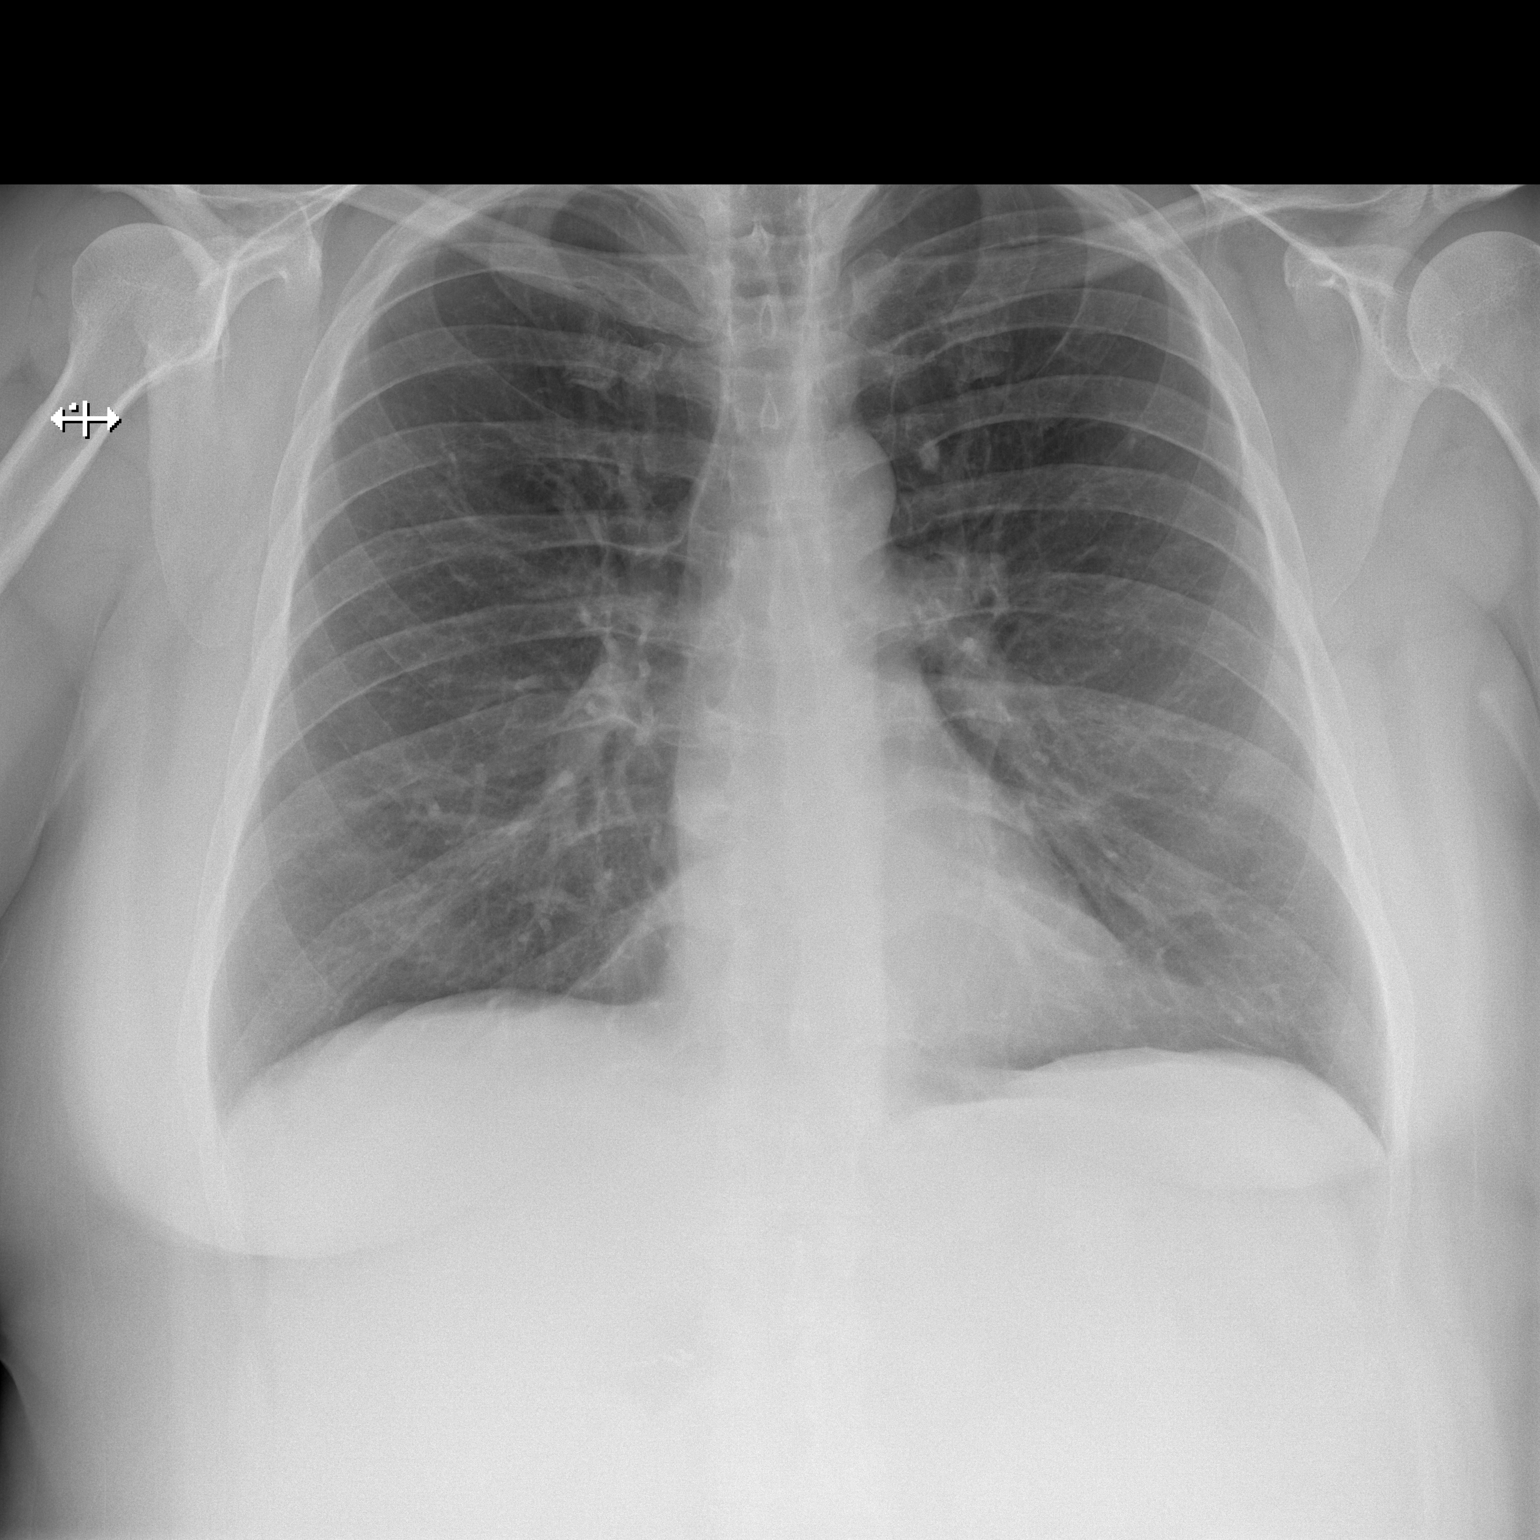

[w chest lat]
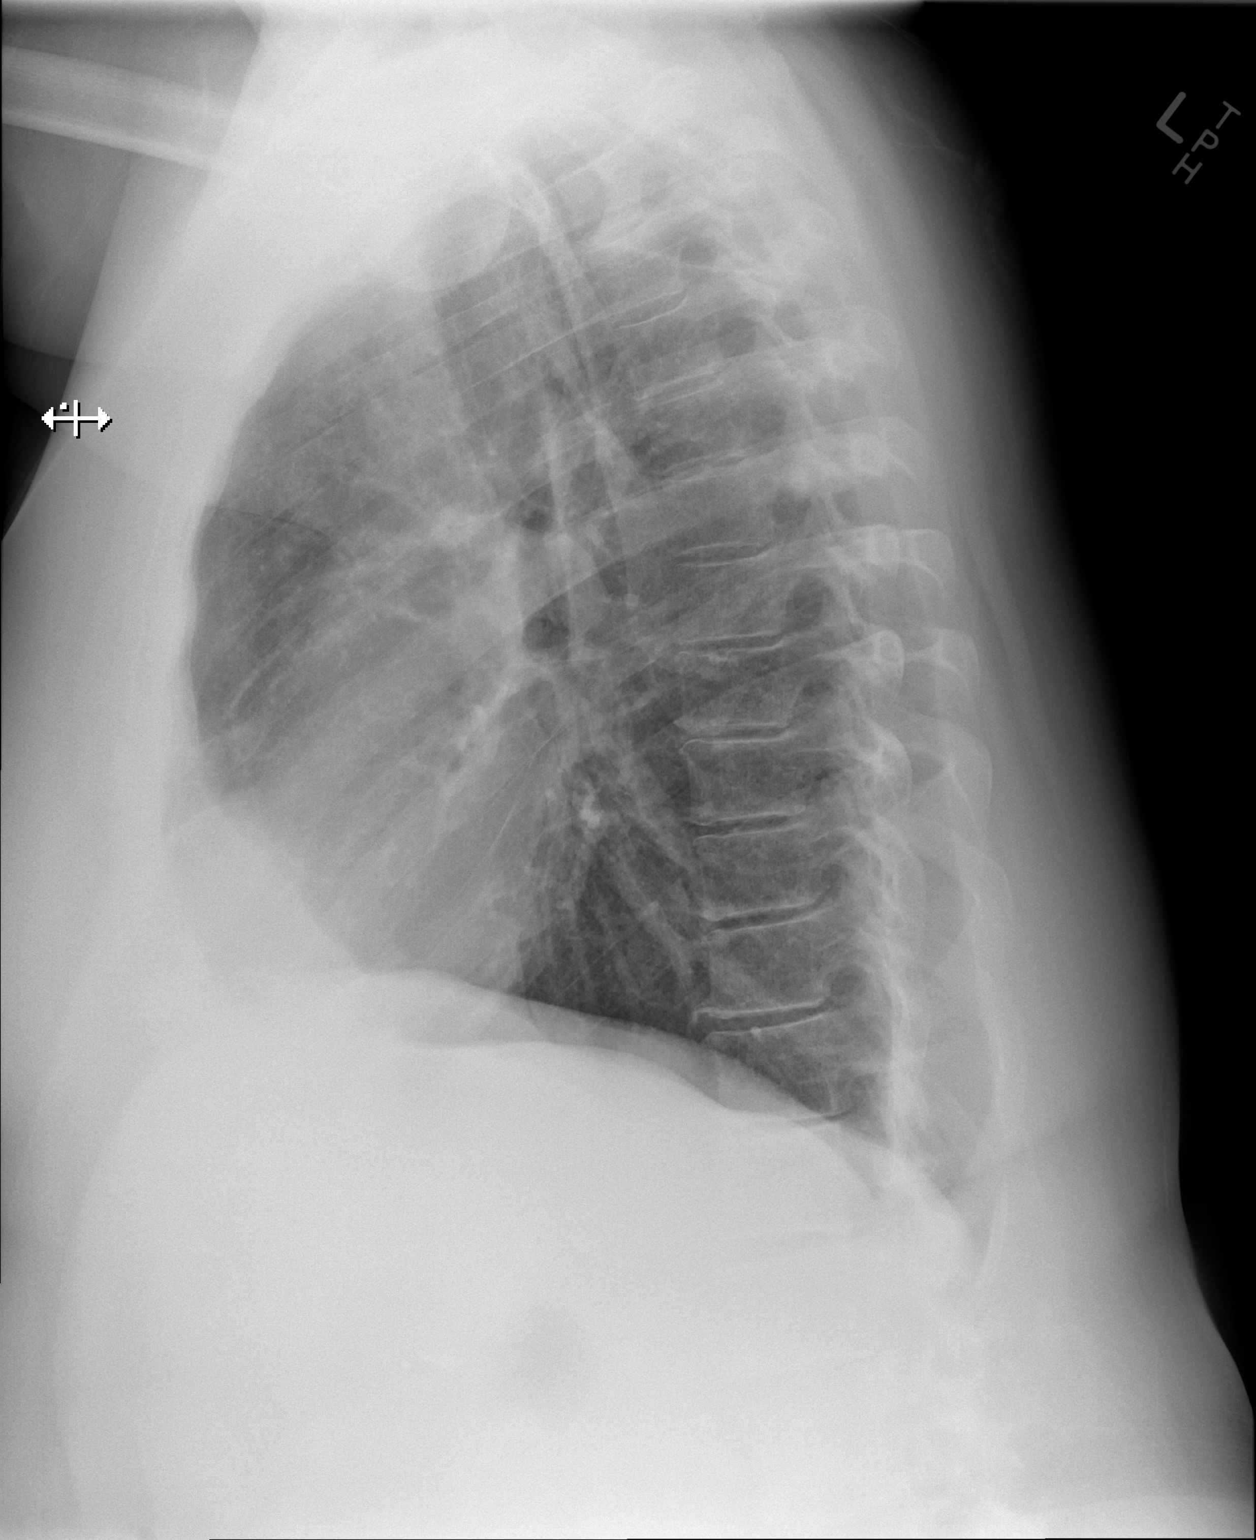

[2 of 2 positions shown; findings below may reference images not displayed]

FINDINGS: The lungs are well-expanded. There is mild hemidiaphragm flattening.
There is no focal infiltrate. The cardiac silhouette is normal in
size. The pulmonary vascularity is not engorged. There is a stable
calcified nodule 1 cm lateral to the aortic arch on the frontal film
consistent with previous granulomatous infection. It measures
approximately 8 mm in maximal dimension. There is no pleural
effusion. The trachea is midline. The observed portions of the bony
thorax appear normal.
IMPRESSION: There is no evidence of active cardiopulmonary disease. Mild
hyperinflation likely reflects known COPD and/or reactive airway
disease.

## 2015-07-22 ENCOUNTER — Encounter: Payer: Self-pay | Admitting: Family Medicine

## 2015-07-22 ENCOUNTER — Ambulatory Visit (INDEPENDENT_AMBULATORY_CARE_PROVIDER_SITE_OTHER): Payer: Commercial Managed Care - HMO | Admitting: Family Medicine

## 2015-07-22 VITALS — BP 132/82 | HR 75 | Temp 98.7°F | Resp 18 | Wt 252.0 lb

## 2015-07-22 DIAGNOSIS — J4550 Severe persistent asthma, uncomplicated: Secondary | ICD-10-CM | POA: Diagnosis not present

## 2015-07-22 DIAGNOSIS — Z1239 Encounter for other screening for malignant neoplasm of breast: Secondary | ICD-10-CM

## 2015-07-22 DIAGNOSIS — Z1211 Encounter for screening for malignant neoplasm of colon: Secondary | ICD-10-CM | POA: Diagnosis not present

## 2015-07-22 MED ORDER — MONTELUKAST SODIUM 10 MG PO TABS: 10.0000 mg | ORAL_TABLET | Freq: Every day | ORAL | Status: DC

## 2015-07-22 NOTE — Progress Notes (Signed)
   Subjective:    Patient ID: Robin [name], female    DOB: Jul 31, 1966, 49 y.o.   MRN: [id number]  HPI 2 months follow-up for asthma. When I last saw her in September she had an asthma exacerbation along with sinus infection and bronchitis. She is actually feeling much better today. She's currently on Advair 500 and uses albuterol as needed. In the last 2 weeks has had to use her albuterol about twice a day.  She did run out of her Advair 500 about a week ago and has been using her old to 3450 but does have a new perception waiting at the pharmacy for her to pick up today for the 500.  She wears a mask when she works in the yard.  She plans to start her allegra as well. 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  Review of Systems     Objective:   Physical Exam  Constitutional: She is oriented to person, place, and time. She appears well-developed and well-nourished.  HENT:  Head: Normocephalic and atraumatic.  Cardiovascular: Normal rate, regular rhythm and normal heart sounds.   Pulmonary/Chest: Effort normal and breath sounds normal.  Neurological: She is alert and oriented to person, place, and time.  Skin: Skin is warm and dry.  Psychiatric: She has a normal mood and affect. Her behavior is normal.          Assessment & Plan:  Asthma, persistant severe-she still having use her albuterol twice a day for the last couple of days because she ran out of the Advair 500 and started using her old to 7350 again. But says she has a 500 Advair at the pharmacy waiting for her and she will pick it up today. Encouraged get back on track. We will add Singulair since she definitely has allergic component to her asthma at this time of year. She's also going to restart her Allegra. As long she is doing well and using her albuterol less frequently than I will see her back in 3 months..   Colon cancer screening.  She declines colonoscopy but says  she would consider Cologuard. We discussed how the test is collected and performed what actually test for. Handout given so she can check with her insurance on coverage. Encouraged her to let me know if she's okay with moving forward with this test.  Declined mammogram.

## 2015-10-22 ENCOUNTER — Encounter: Payer: Self-pay | Admitting: Family Medicine

## 2015-10-22 ENCOUNTER — Ambulatory Visit (INDEPENDENT_AMBULATORY_CARE_PROVIDER_SITE_OTHER): Payer: Commercial Managed Care - HMO | Admitting: Family Medicine

## 2015-10-22 VITALS — BP 137/82 | HR 74 | Wt 251.0 lb

## 2015-10-22 DIAGNOSIS — Z13220 Encounter for screening for lipoid disorders: Secondary | ICD-10-CM

## 2015-10-22 DIAGNOSIS — Z1159 Encounter for screening for other viral diseases: Secondary | ICD-10-CM

## 2015-10-22 DIAGNOSIS — Z114 Encounter for screening for human immunodeficiency virus [HIV]: Secondary | ICD-10-CM

## 2015-10-22 DIAGNOSIS — E785 Hyperlipidemia, unspecified: Secondary | ICD-10-CM | POA: Diagnosis not present

## 2015-10-22 DIAGNOSIS — J4551 Severe persistent asthma with (acute) exacerbation: Secondary | ICD-10-CM

## 2015-10-22 DIAGNOSIS — J209 Acute bronchitis, unspecified: Secondary | ICD-10-CM

## 2015-10-22 DIAGNOSIS — J0100 Acute maxillary sinusitis, unspecified: Secondary | ICD-10-CM

## 2015-10-22 DIAGNOSIS — J45901 Unspecified asthma with (acute) exacerbation: Secondary | ICD-10-CM | POA: Diagnosis not present

## 2015-10-22 MED ORDER — AZITHROMYCIN 250 MG PO TABS: ORAL_TABLET | ORAL | Status: AC

## 2015-10-22 MED ORDER — PREDNISONE 20 MG PO TABS: 40.0000 mg | ORAL_TABLET | Freq: Every day | ORAL | Status: DC

## 2015-10-22 NOTE — Progress Notes (Signed)
   Subjective:    Patient ID: Robin [name], female    DOB: [birth year], 50 y.o.   MRN: [id number]  HPI F/U Asthma - She is on Advair  BID  and she is on her singulair.  Not on her allegra. She is still using her albuterol daily for the last week or two.  Has had a URI as well.  Her asthma typically flares in the spring. She's never had allergy testing done.  1 week or sinus pressure below her eyes bilat, congestion and chest congestion with cough. No ST. Mild ear pressure. No fever.  No GI symptoms. She has been around sick children.   Review of Systems     Objective:   Physical Exam  Constitutional: She is oriented to person, place, and time. She appears well-developed and well-nourished.  HENT:  Head: Normocephalic and atraumatic.  Right Ear: External ear normal.  Left Ear: External ear normal.  Nose: Nose normal.  Mouth/Throat: Oropharynx is clear and moist.  TMs and canals are clear.   Eyes: Conjunctivae and EOM are normal. Pupils are equal, round, and reactive to light.  Neck: Neck supple. No thyromegaly present.  Cardiovascular: Normal rate, regular rhythm and normal heart sounds.   Pulmonary/Chest: Effort normal and breath sounds normal. She has no wheezes.  Lymphadenopathy:    She has no cervical adenopathy.  Neurological: She is alert and oriented to person, place, and time.  Skin: Skin is warm and dry.  Psychiatric: She has a normal mood and affect.          Assessment & Plan:  Asthma exacerbation - Encuoraged her to use her Peak flow meter at home to judge if she needs to use her albuterol. She says she typically uses her albuterol first and then uses her Advair. Encouraged her to use her Advair first and wait at least 30 minutes before deciding if she needs to use her albuterol. Certainly while she sick she can use her albuterol every 4-6 hours as needed. Medical head and treat her with a 5 day course of prednisone as well as azithromycin. He also discussed that  she can consider adding an oral antihistamine for the spring if needed and also consider referral to an allergist for further evaluation. She says she will think about it.  Acute sinusitis/bronchitis  - we'll treat with azithromycin and prednisone. Call if not significantly better in one week.

## 2015-10-23 LAB — COMPLETE METABOLIC PANEL WITH GFR
ALBUMIN: 4 g/dL (ref 3.6–5.1)
ALK PHOS: 81 U/L (ref 33–130)
ALT: 17 U/L (ref 6–29)
AST: 15 U/L (ref 10–35)
BILIRUBIN TOTAL: 0.2 mg/dL (ref 0.2–1.2)
BUN: 20 mg/dL (ref 7–25)
CALCIUM: 9.3 mg/dL (ref 8.6–10.4)
CHLORIDE: 106 mmol/L (ref 98–110)
CO2: 24 mmol/L (ref 20–31)
CREATININE: 0.74 mg/dL (ref 0.50–1.05)
GFR, Est African American: >89 mL/min (ref >=60)
GFR, Est Non African American: >89 mL/min (ref >=60)
Glucose, Bld: 99 mg/dL (ref 65–99)
Potassium: 4.1 mmol/L (ref 3.5–5.3)
Sodium: 140 mmol/L (ref 135–146)
TOTAL PROTEIN: 7 g/dL (ref 6.1–8.1)

## 2015-10-23 LAB — LIPID PANEL
CHOLESTEROL: 160 mg/dL (ref 125–200)
HDL: 52 mg/dL (ref >=46)
LDL Cholesterol: 85 mg/dL (ref <130)
Total CHOL/HDL Ratio: 3.1 Ratio (ref <=5.0)
Triglycerides: 115 mg/dL (ref <150)
VLDL: 23 mg/dL (ref <30)

## 2015-10-23 LAB — HEPATITIS C ANTIBODY: HCV AB: NEGATIVE

## 2015-10-23 LAB — HIV ANTIBODY (ROUTINE TESTING W REFLEX): HIV 1&2 Ab, 4th Generation: NONREACTIVE

## 2015-10-25 ENCOUNTER — Ambulatory Visit: Payer: Commercial Managed Care - HMO

## 2015-10-30 ENCOUNTER — Encounter: Payer: Self-pay | Admitting: Family Medicine

## 2015-10-30 DIAGNOSIS — E785 Hyperlipidemia, unspecified: Secondary | ICD-10-CM | POA: Insufficient documentation

## 2015-11-06 ENCOUNTER — Ambulatory Visit (INDEPENDENT_AMBULATORY_CARE_PROVIDER_SITE_OTHER): Payer: Commercial Managed Care - HMO | Admitting: Family Medicine

## 2015-11-06 VITALS — BP 124/78 | HR 90 | Temp 98.0°F | Wt 250.0 lb

## 2015-11-06 DIAGNOSIS — Z23 Encounter for immunization: Secondary | ICD-10-CM

## 2015-11-06 NOTE — Progress Notes (Signed)
Agree with below. \Catherine Metheney, MD  

## 2015-11-06 NOTE — Progress Notes (Signed)
Patient came into office today for Tdap immunization. Pt states she has a new granddaughter and wants to get this injection as protection for the baby. Pt tolerated injection well in left deltoid, no immediate complications.

## 2016-01-21 ENCOUNTER — Ambulatory Visit (INDEPENDENT_AMBULATORY_CARE_PROVIDER_SITE_OTHER): Payer: Commercial Managed Care - HMO | Admitting: Family Medicine

## 2016-01-21 ENCOUNTER — Encounter: Payer: Self-pay | Admitting: Family Medicine

## 2016-01-21 VITALS — BP 136/76 | Wt 245.0 lb

## 2016-01-21 DIAGNOSIS — J4541 Moderate persistent asthma with (acute) exacerbation: Secondary | ICD-10-CM | POA: Diagnosis not present

## 2016-01-21 DIAGNOSIS — J0100 Acute maxillary sinusitis, unspecified: Secondary | ICD-10-CM | POA: Diagnosis not present

## 2016-01-21 MED ORDER — AMOXICILLIN-POT CLAVULANATE 875-125 MG PO TABS: 1.0000 | ORAL_TABLET | Freq: Two times a day (BID) | ORAL | Status: DC

## 2016-01-21 MED ORDER — FLUTICASONE-SALMETEROL 250-50 MCG/DOSE IN AEPB: 1.0000 | INHALATION_SPRAY | Freq: Two times a day (BID) | RESPIRATORY_TRACT | Status: DC

## 2016-01-21 NOTE — Progress Notes (Signed)
Subjective:    CC: F/U Asthma   HPI:  Asthma -  She is doing better overall. She actually quit using her rescue inhaler because she actually felt like she was overusing it. She's been doing other things like sitting down and resting if she gets little bit short of breath and says that has been helping. She does have a peak flow to meter but hasn't been using it lately. She still on Advair 500 mg and does feel like she is ready to decrease her dose down to 250 mg.  Unfortunately she's had an upper respiratory infection for the last week. She's had significant nasal congestion with green discharge, cough and sputum production. She's also had some discomfort in her right ear. No sore throat. No fevers chills or sweats. She does currently take Singulair. She does feel a little better the last day or two.   Past medical history, Surgical history, Family history not pertinant except as noted below, Social history, Allergies, and medications have been entered into the medical record, reviewed, and corrections made.   Review of Systems: No fevers, chills, night sweats, weight loss, chest pain, or shortness of breath.   Objective:    General: Well Developed, well nourished, and in no acute distress.  Neuro: Alert and oriented x3, extra-ocular muscles intact, sensation grossly intact.  HEENT: Normocephalic, atraumatic  Skin: Warm and dry, no rashes. Cardiac: Regular rate and rhythm, no murmurs rubs or gallops, no lower extremity edema.  Respiratory: Clear to auscultation bilaterally. Not using accessory muscles, speaking in full sentences.   Impression and Recommendations:   Asthma-moderate persistent-we'll decrease her Advair down to the milligrams. Follow-up in 3 months. Next  Acute sinusitis -we'll go ahead and give her perception for an antibiotic to fill if she suddenly gets worse or if she's not better by the end of the week. Encouraged symptomatic treatment over-the-counter.

## 2016-03-24 ENCOUNTER — Other Ambulatory Visit: Payer: Self-pay | Admitting: Family Medicine

## 2016-04-07 ENCOUNTER — Other Ambulatory Visit: Payer: Self-pay | Admitting: Family Medicine

## 2016-04-22 ENCOUNTER — Ambulatory Visit (INDEPENDENT_AMBULATORY_CARE_PROVIDER_SITE_OTHER): Payer: Commercial Managed Care - HMO | Admitting: Family Medicine

## 2016-04-22 ENCOUNTER — Encounter: Payer: Self-pay | Admitting: Family Medicine

## 2016-04-22 VITALS — BP 124/82 | HR 66 | Ht 62.0 in | Wt 239.0 lb

## 2016-04-22 DIAGNOSIS — J4540 Moderate persistent asthma, uncomplicated: Secondary | ICD-10-CM

## 2016-04-22 DIAGNOSIS — M2550 Pain in unspecified joint: Secondary | ICD-10-CM | POA: Diagnosis not present

## 2016-04-22 DIAGNOSIS — Z23 Encounter for immunization: Secondary | ICD-10-CM | POA: Insufficient documentation

## 2016-04-22 MED ORDER — LEVOCETIRIZINE DIHYDROCHLORIDE 5 MG PO TABS: 5.0000 mg | ORAL_TABLET | Freq: Every evening | ORAL | 1 refills | Status: DC

## 2016-04-22 NOTE — Progress Notes (Signed)
Subjective:    CC: Follow-up asthma-  HPI:  Follow-up asthma- she has been able to wean to the Advair 250/50 down from the 500 g dose. She has had to use her albuterol a little more frequently since going down but says she feels much better about being on less of the steroid and the lower dose Advair is actually causing her much less. I asked if she was using her peak flow meter and she says that she often forgets. She has had chronic persistent symptoms are most all of her adult life.  Morbid obesity/BMI 43-she is actually lost 6 more pounds which is fantastic. She is down a total of 12 pounds from February. She has cut out sugary foods completely as her roommate is now diabetic.  She is also still having a lot of pain with her arthritis particularly in her shoulders. She's also been getting a little bit of numbness and healing in her hands. She wonders if there something that could be added to the meloxicam that she's already taking.  Past medical history, Surgical history, Family history not pertinant except as noted below, Social history, Allergies, and medications have been entered into the medical record, reviewed, and corrections made.   Review of Systems: No fevers, chills, night sweats, weight loss, chest pain, or shortness of breath.   Objective:    General: Well Developed, well nourished, and in no acute distress.  Neuro: Alert and oriented x3, extra-ocular muscles intact, sensation grossly intact.  HEENT: Normocephalic, atraumatic  Skin: Warm and dry, no rashes. Cardiac: Regular rate and rhythm, no murmurs rubs or gallops, no lower extremity edema.  Respiratory: Clear to auscultation bilaterally. Not using accessory muscles, speaking in full sentences.She does have some expiratory wheezing at the bases bilaterally and on the right anterior chest fields today.   Impression and Recommendations:    Asthma moderate persistent-her symptoms are quite severe. I think she would really  benefit from some allergy testing to better eyes her exposure to those things that may be triggering her. She's never been tested before. Continue Singulair. She will need to stop her antihistamine completely for 2 weeks before doing the testing. Will try switching to Xyzal for cost reasons  Morbid obesity/BMI 43-congratulated her on her success so far. She is also total of 12 pounds since February. We also discussed some strategies such as using the smart phone application called my fitness pal to help her set calorie goals and to continue to work on trying to increase activity levels.  Poly arthralgia-can add Tylenol to the meloxicam safely. We discussed maximum dosage on the Tylenol for safety.  I think continued weight loss will be helpful as well.

## 2016-04-22 NOTE — Patient Instructions (Signed)
Stop all antihistamine for 2 weeks and then go for allergy testing bloodwork.

## 2016-05-20 DIAGNOSIS — J4541 Moderate persistent asthma with (acute) exacerbation: Secondary | ICD-10-CM | POA: Diagnosis not present

## 2016-05-21 LAB — MIDATLANTIC REGIONAL ALLERGY PANEL (DC,DE,MD,~~LOC~~,VA,WV)
ALLERGEN, A. ALTERNATA, M6: 0.17 kU/L — AB
Allergen, Cottonwood, t14: <0.1 kU/L
Allergen, Walnut,t10: <0.1 kU/L
Box Elder IgE: <0.1 kU/L
Common Ragweed: <0.1 kU/L
Johnson Grass: <0.1 kU/L
MEADOW GRASS: 0.21 kU/L — AB

## 2016-05-25 LAB — IGG FOOD PANEL
ALLERGEN EGG WHITE IGG: 32.8 ug/mL — AB (ref <2.0)
ALLERGEN EGG YOLK IGG: 14.3 ug/mL — AB (ref <2.0)
ALLERGEN PEANUT IGG: 1.22 ug/mL — AB (ref <0.15)
ALLERGEN, MILK, IGG: 16.7 ug/mL — AB (ref <0.15)
Allergen, Corn, IgG4: 0.25 ug/mL — ABNORMAL HIGH (ref <0.15)
Beef, IgG: 11.7 ug/mL — ABNORMAL HIGH (ref <2.0)
Chicken, IgG: <0.15 ug/mL (ref <0.15)
Wheat, IgG: 1.41 ug/mL — ABNORMAL HIGH (ref <0.15)

## 2016-07-23 ENCOUNTER — Other Ambulatory Visit: Payer: Self-pay | Admitting: Family Medicine

## 2016-10-21 ENCOUNTER — Other Ambulatory Visit: Payer: Self-pay | Admitting: Family Medicine

## 2016-11-06 ENCOUNTER — Telehealth: Payer: Self-pay | Admitting: Family Medicine

## 2016-11-23 ENCOUNTER — Other Ambulatory Visit: Payer: Self-pay | Admitting: Family Medicine

## 2017-01-04 ENCOUNTER — Other Ambulatory Visit: Payer: Self-pay | Admitting: Family Medicine

## 2017-02-16 ENCOUNTER — Other Ambulatory Visit: Payer: Self-pay | Admitting: Family Medicine

## 2017-03-08 ENCOUNTER — Ambulatory Visit (INDEPENDENT_AMBULATORY_CARE_PROVIDER_SITE_OTHER): Payer: Medicare HMO | Admitting: Family Medicine

## 2017-03-08 VITALS — BP 131/58 | HR 72 | Ht 63.0 in | Wt 242.0 lb

## 2017-03-08 DIAGNOSIS — J4540 Moderate persistent asthma, uncomplicated: Secondary | ICD-10-CM

## 2017-03-08 DIAGNOSIS — Z13220 Encounter for screening for lipoid disorders: Secondary | ICD-10-CM

## 2017-03-08 DIAGNOSIS — Z1231 Encounter for screening mammogram for malignant neoplasm of breast: Secondary | ICD-10-CM

## 2017-03-08 DIAGNOSIS — H9193 Unspecified hearing loss, bilateral: Secondary | ICD-10-CM | POA: Diagnosis not present

## 2017-03-08 DIAGNOSIS — Z1239 Encounter for other screening for malignant neoplasm of breast: Secondary | ICD-10-CM

## 2017-03-08 DIAGNOSIS — J309 Allergic rhinitis, unspecified: Secondary | ICD-10-CM | POA: Diagnosis not present

## 2017-03-08 NOTE — Progress Notes (Signed)
Subjective:    CC: Asthma  HPI: F/U Asthma - Last seen approximately 11 months ago. At that time we discussed allergy testing.She's been taking her Advair more consistently. She still has to serve a beer occasionally. She says especially if she gets out in the heat she may use it several times a day but then she may go several days without it.  She also reports some bilateral hearing loss. She's noticed that she's asking to repeat herself more often. No ear pain or pressure but she would like to have audiometry. Hearing loss was not sudden.  Allergic rhinitis-she says the size all has really helped. Her irritative itching has completely resolved on the medication. She said when she ran out for a short period of time it came back.  Past medical history, Surgical history, Family history not pertinant except as noted below, Social history, Allergies, and medications have been entered into the medical record, reviewed, and corrections made.   Review of Systems: No fevers, chills, night sweats, weight loss, chest pain, or shortness of breath.   Objective:    General: Well Developed, well nourished, and in no acute distress.  Neuro: Alert and oriented x3, extra-ocular muscles intact, sensation grossly intact.  HEENT: Normocephalic, atraumatic  Skin: Warm and dry, no rashes. Cardiac: Regular rate and rhythm, no murmurs rubs or gallops, no lower extremity edema.  Respiratory: Clear to auscultation bilaterally. Not using accessory muscles, speaking in full sentences.   Impression and Recommendations:    Asthma-Continue with Advair 250/50-discussed with her that we could certainly try increasing the medication if she still using her albuterol frequently. She is very hesitant to do that today but encouraged her to call me if she would like to do so.  Hearing loss-will refer for audiometry.  Allergic rhinitis-continue with Xyzal.    Reminded her to please schedule her mammogram. Order  placed.  Screening-also due for lipids checked.

## 2017-03-17 ENCOUNTER — Other Ambulatory Visit: Payer: Self-pay | Admitting: Family Medicine

## 2017-04-08 DIAGNOSIS — H903 Sensorineural hearing loss, bilateral: Secondary | ICD-10-CM | POA: Diagnosis not present

## 2017-04-19 ENCOUNTER — Other Ambulatory Visit: Payer: Self-pay | Admitting: Family Medicine

## 2017-04-28 ENCOUNTER — Ambulatory Visit (INDEPENDENT_AMBULATORY_CARE_PROVIDER_SITE_OTHER): Payer: Medicare HMO

## 2017-04-28 DIAGNOSIS — Z1231 Encounter for screening mammogram for malignant neoplasm of breast: Secondary | ICD-10-CM

## 2017-04-28 DIAGNOSIS — Z1239 Encounter for other screening for malignant neoplasm of breast: Secondary | ICD-10-CM

## 2017-05-26 NOTE — Telephone Encounter (Signed)
Issue resolved.

## 2017-05-31 ENCOUNTER — Other Ambulatory Visit: Payer: Self-pay | Admitting: Family Medicine

## 2017-07-02 ENCOUNTER — Other Ambulatory Visit: Payer: Self-pay | Admitting: Family Medicine

## 2017-07-09 ENCOUNTER — Ambulatory Visit: Payer: Medicare HMO | Admitting: Family Medicine

## 2017-07-14 ENCOUNTER — Encounter: Payer: Self-pay | Admitting: Family Medicine

## 2017-07-14 ENCOUNTER — Ambulatory Visit: Payer: Medicare HMO | Admitting: Family Medicine

## 2017-07-14 VITALS — BP 132/75 | HR 69 | Ht 63.0 in | Wt 248.0 lb

## 2017-07-14 DIAGNOSIS — M160 Bilateral primary osteoarthritis of hip: Secondary | ICD-10-CM | POA: Diagnosis not present

## 2017-07-14 DIAGNOSIS — K146 Glossodynia: Secondary | ICD-10-CM | POA: Diagnosis not present

## 2017-07-14 DIAGNOSIS — J4550 Severe persistent asthma, uncomplicated: Secondary | ICD-10-CM | POA: Diagnosis not present

## 2017-07-14 DIAGNOSIS — E7849 Other hyperlipidemia: Secondary | ICD-10-CM | POA: Diagnosis not present

## 2017-07-14 DIAGNOSIS — Z23 Encounter for immunization: Secondary | ICD-10-CM

## 2017-07-14 MED ORDER — FLUTICASONE FUROATE-VILANTEROL 200-25 MCG/INH IN AEPB: 1.0000 | INHALATION_SPRAY | Freq: Every day | RESPIRATORY_TRACT | 1 refills | Status: DC

## 2017-07-14 MED ORDER — MELOXICAM 7.5 MG PO TABS: ORAL_TABLET | ORAL | 1 refills | Status: DC

## 2017-07-14 NOTE — Progress Notes (Signed)
Subjective:    Patient ID: Robin [name], female    DOB: 07/09/1966, 51 y.o.   MRN: [id number]  HPI  51 year old female comes in today to follow-up on her asthma.  She is currently on Advair and Singulair.  Is on the Advair 250 but she is still having a lot of symptoms.  She is at the point where she really feels like she needs to go up on her Advair to the 500.  She says the fall has been a struggle for her.  She is taking her Singulair on and off but not consistently but she has been taking her Xyzal every day.  She does have a lot of shortness of breath and wheezing, with some cough .  She does get in some cough first thing in the morning.  With a little bit of sputum.  She just feels like her albuterol is not working well so she just does not use it.  She says usually she will do 1 puff and then if she is not better she will do a second puff.    Reports that her tongue feels sore and hurts.  She says is been going on for a couple of weeks.  She initially thought she might be getting thrush but has not seen any rash or white spots in the mouth or throat or tongue.  She says there is a constant discomfort but it is worse with trying to eat hard things.  Would also like a refill on her meloxicam for her arthritis of the hips.  Review of Systems  BP 132/75   Pulse 69   Ht 5\' 3"  (1.6 m)   Wt 248 lb (112.5 kg)   SpO2 97%   BMI 43.93 kg/m     Allergies  Allergen Reactions  . Fluticasone Other (See Comments)    Headache (tolerates Advair with no reaction)     Past Medical History:  Diagnosis Date  . Arthritis   . Asthma    Flonase,Singulair,Advair daily  . Convulsion (HCC)    as a baby   . COPD (chronic obstructive pulmonary disease) (HCC)    uses Albuterol daily as needed  . Dysphagia   . GERD (gastroesophageal reflux disease)   . History of bronchitis    last time 39yrs ago  . History of kidney stones   . Hyperlipidemia    borderline no meds required  . Joint pain   .  Joint swelling   . Shortness of breath    lying/sitting/exertion    Past Surgical History:  Procedure Laterality Date  . CHOLECYSTECTOMY  2002  . ERCP    . LUMBAR LAMINECTOMY  1994   L4-5  . TOTAL HIP ARTHROPLASTY Left 11/24/2013   Procedure: LEFT TOTAL HIP ARTHROPLASTY;  Surgeon: Nestor Lewandowsky, MD;  Location: [facility] OR;  Service: Orthopedics;  Laterality: Left;  . TOTAL HIP ARTHROPLASTY Right 02/05/2014   Procedure: RIGHT TOTAL HIP ARTHROPLASTY;  Surgeon: Nestor Lewandowsky, MD;  Location: [facility] OR;  Service: Orthopedics;  Laterality: Right;    Social History   Socioeconomic History  . Marital status: Married    Spouse name: Not on file  . Number of children: 1  . Years of education: Not on file  . Highest education level: Not on file  Social Needs  . Financial resource strain: Not on file  . Food insecurity - worry: Not on file  . Food insecurity - inability: Not on file  . Transportation needs -  medical: Not on file  . Transportation needs - non-medical: Not on file  Occupational History  . Occupation: Programmer, applicationsHouse Keeper  Tobacco Use  . Smoking status: Former Smoker    Packs/day: 1.00    Years: 25.00    Pack years: 25.00    Types: Cigarettes    Last attempt to quit: 01/30/2004    Years since quitting: 13.4  . Smokeless tobacco: Never Used  . Tobacco comment: quit 8-5064yrs ago  Substance and Sexual Activity  . Alcohol use: No  . Drug use: No  . Sexual activity: Yes  Other Topics Concern  . Not on file  Social History Narrative   No regular exercise.     Family History  Problem Relation Age of Onset  . Alcohol abuse Brother   . Alcohol abuse Sister   . Heart attack Father   . Diabetes Unknown        grandparents.     Outpatient Encounter Medications as of 07/14/2017  Medication Sig  . Cranberry-Vitamin C-Probiotic (AZO CRANBERRY) 250-30 MG TABS Take by mouth.  . levocetirizine (XYZAL) 5 MG tablet TAKE 1 TABLET BY MOUTH IN THE EVENING  . meloxicam (MOBIC) 7.5 MG tablet Take 1  to 2 tablets by mouth once daily  . montelukast (SINGULAIR) 10 MG tablet Take 1 tablet (10 mg total) by mouth at bedtime.  Marland Kitchen. omeprazole (PRILOSEC OTC) 20 MG tablet Take 20 mg by mouth daily.  Marland Kitchen. OVER THE COUNTER MEDICATION Apply 1 application topically at bedtime. "Two old goats cream" for arthritis pain  . VENTOLIN HFA 108 (90 Base) MCG/ACT inhaler INHALE TWO PUFFS INTO THE LUNGS EVERY 4 HOURS AS NEEDED FOR WHEEZING OR SHORTNESS OF BREATH  . [DISCONTINUED] ADVAIR DISKUS 250-50 MCG/DOSE AEPB INHALE 1 DOSE BY MOUTH TWICE DAILY  . [DISCONTINUED] meloxicam (MOBIC) 7.5 MG tablet TAKE 1 TO 2 TABLETS BY MOUTH ONCE DAILY  . fluticasone furoate-vilanterol (BREO ELLIPTA) 200-25 MCG/INH AEPB Inhale 1 puff into the lungs daily.  . [DISCONTINUED] albuterol (PROVENTIL) (2.5 MG/3ML) 0.083% nebulizer solution Take 3 mLs (2.5 mg total) by nebulization every 6 (six) hours as needed for wheezing.   No facility-administered encounter medications on file as of 07/14/2017.          Objective:   Physical Exam  Constitutional: She is oriented to person, place, and time. She appears well-developed and well-nourished.  HENT:  Head: Normocephalic and atraumatic.  Right Ear: External ear normal.  Left Ear: External ear normal.  Nose: Nose normal.  Mouth/Throat: Oropharynx is clear and moist.  TMs and canals are clear.   Eyes: Conjunctivae and EOM are normal. Pupils are equal, round, and reactive to light.  Neck: Neck supple. No thyromegaly present.  Cardiovascular: Normal rate, regular rhythm and normal heart sounds.  Pulmonary/Chest: Effort normal and breath sounds normal. She has no wheezes.  Lymphadenopathy:    She has no cervical adenopathy.  Neurological: She is alert and oriented to person, place, and time.  Skin: Skin is warm and dry.  Psychiatric: She has a normal mood and affect.          Assessment & Plan:   Asthma - severe persistent-we will switch her to Breo 200/25 daily.  Showed her how  to use the device.  If she is doing well after 1 month we may be able to step her back down.  Encouraged her to use her Singulair little bit more consistently especially since she is struggling with her asthma right now.  No  sign of acute infection or bronchitis but certainly if the sputum production increases or she develops fevers or chills or worsening shortness of breath and please give us a call.Discussed increasing her albuterol to 2 puffs when she needs it and then if after 15-20 minutes she is not getting significant response then she can give 2 more puffs.  I think this will be more effective than 1 puff.  Tongue pain-unclear etiology.  Will check for B12 deficiency and B6 deficiency.  She is also well overdue for CMP and lipid panel.  There was no sign of thrush on exam but certainly if she notices any increased pain, if not resolving, develops redness or rash then please let us know.  Hyperlipidemia - due to recheck lipoids.   Degenerative arthritis of the hip-refilled meloxicam today.

## 2017-09-15 ENCOUNTER — Other Ambulatory Visit: Payer: Self-pay | Admitting: *Deleted

## 2017-09-15 DIAGNOSIS — J4550 Severe persistent asthma, uncomplicated: Secondary | ICD-10-CM

## 2017-09-15 MED ORDER — FLUTICASONE FUROATE-VILANTEROL 200-25 MCG/INH IN AEPB: 1.0000 | INHALATION_SPRAY | Freq: Every day | RESPIRATORY_TRACT | 6 refills | Status: DC

## 2017-10-20 ENCOUNTER — Ambulatory Visit: Payer: Medicare HMO | Admitting: Family Medicine

## 2017-10-28 ENCOUNTER — Other Ambulatory Visit: Payer: Self-pay | Admitting: *Deleted

## 2017-10-28 MED ORDER — LEVOCETIRIZINE DIHYDROCHLORIDE 5 MG PO TABS: 5.0000 mg | ORAL_TABLET | Freq: Every evening | ORAL | 1 refills | Status: DC

## 2017-11-03 ENCOUNTER — Ambulatory Visit (INDEPENDENT_AMBULATORY_CARE_PROVIDER_SITE_OTHER): Payer: Medicare HMO | Admitting: Family Medicine

## 2017-11-03 ENCOUNTER — Encounter: Payer: Self-pay | Admitting: Family Medicine

## 2017-11-03 VITALS — BP 137/74 | HR 67 | Ht 63.0 in | Wt 245.0 lb

## 2017-11-03 DIAGNOSIS — J4550 Severe persistent asthma, uncomplicated: Secondary | ICD-10-CM

## 2017-11-03 DIAGNOSIS — J4541 Moderate persistent asthma with (acute) exacerbation: Secondary | ICD-10-CM

## 2017-11-03 DIAGNOSIS — J018 Other acute sinusitis: Secondary | ICD-10-CM | POA: Diagnosis not present

## 2017-11-03 DIAGNOSIS — H938X3 Other specified disorders of ear, bilateral: Secondary | ICD-10-CM | POA: Diagnosis not present

## 2017-11-03 MED ORDER — PREDNISONE 20 MG PO TABS: 40.0000 mg | ORAL_TABLET | Freq: Every day | ORAL | 0 refills | Status: DC

## 2017-11-03 MED ORDER — MONTELUKAST SODIUM 10 MG PO TABS: 10.0000 mg | ORAL_TABLET | Freq: Every day | ORAL | 1 refills | Status: DC

## 2017-11-03 NOTE — Progress Notes (Addendum)
Subjective:    Patient ID: Robin [name], female    DOB: 1966-07-01, 52 y.o.   MRN: [id number]  HPI  [age] y[age] Guardianear old female comes in today to follow-up for her asthma.  She is on Breo and doing well.  She likes the device and feels like it works well.  Though in the last couple of weeks she has been sick so she has been using her rescue albuterol more frequently almost daily.   For 2 weeks she has had had sinus sxs. Started with watery eyes and runny nose and then became more facial pressure and a cough.  Now she feels like it is more in her ears and most like she feels like she is in a box like they are very full.  No fevers chills or sweats recently.  She did have a fever at the beginning of the illness.  She occasionally uses nasal saline and that helps some.  She is not currently on her Singulair.  Review of Systems  BP 137/74   Pulse 67   Ht 5\' 3"  (1.6 m)   Wt 245 lb (111.1 kg)   SpO2 97%   BMI 43.40 kg/m     Allergies  Allergen Reactions  . Fluticasone Other (See Comments)    Headache (tolerates Advair with no reaction)     Past Medical History:  Diagnosis Date  . Arthritis   . Asthma    Flonase,Singulair,Advair daily  . Convulsion (HCC)    as a baby   . COPD (chronic obstructive pulmonary disease) (HCC)    uses Albuterol daily as needed  . Dysphagia   . GERD (gastroesophageal reflux disease)   . History of bronchitis    last time 9722yrs ago  . History of kidney stones   . Hyperlipidemia    borderline no meds required  . Joint pain   . Joint swelling   . Shortness of breath    lying/sitting/exertion    Past Surgical History:  Procedure Laterality Date  . CHOLECYSTECTOMY  2002  . ERCP    . LUMBAR LAMINECTOMY  1994   L4-5  . TOTAL HIP ARTHROPLASTY Left 11/24/2013   Procedure: LEFT TOTAL HIP ARTHROPLASTY;  Surgeon: Nestor LewandowskyFrank J Rowan, MD;  Location: [facility] OR;  Service: Orthopedics;  Laterality: Left;  . TOTAL HIP ARTHROPLASTY Right 02/05/2014   Procedure: RIGHT TOTAL HIP  ARTHROPLASTY;  Surgeon: Nestor LewandowskyFrank J Rowan, MD;  Location: [facility] OR;  Service: Orthopedics;  Laterality: Right;    Social History   Socioeconomic History  . Marital status: Married    Spouse name: Not on file  . Number of children: 1  . Years of education: Not on file  . Highest education level: Not on file  Social Needs  . Financial resource strain: Not on file  . Food insecurity - worry: Not on file  . Food insecurity - inability: Not on file  . Transportation needs - medical: Not on file  . Transportation needs - non-medical: Not on file  Occupational History  . Occupation: Programmer, applicationsHouse Keeper  Tobacco Use  . Smoking status: Former Smoker    Packs/day: 1.00    Years: 25.00    Pack years: 25.00    Types: Cigarettes    Last attempt to quit: 01/30/2004    Years since quitting: 13.7  . Smokeless tobacco: Never Used  . Tobacco comment: quit [phone] ago  Substance and Sexual Activity  . Alcohol use: No  . Drug use: No  . Sexual activity: Yes  Other Topics Concern  . Not on file  Social History Narrative   No regular exercise.     Family History  Problem Relation Age of Onset  . Alcohol abuse Brother   . Alcohol abuse Sister   . Heart attack Father   . Diabetes Unknown        grandparents.     Outpatient Encounter Medications as of 11/03/2017  Medication Sig  . fluticasone furoate-vilanterol (BREO ELLIPTA) 200-25 MCG/INH AEPB Inhale 1 puff into the lungs daily.  Marland Kitchen levocetirizine (XYZAL) 5 MG tablet Take 1 tablet (5 mg total) by mouth every evening.  . meloxicam (MOBIC) 7.5 MG tablet Take 1 to 2 tablets by mouth once daily  . omeprazole (PRILOSEC OTC) 20 MG tablet Take 20 mg by mouth daily.  Marland Kitchen OVER THE COUNTER MEDICATION Apply 1 application topically at bedtime. "Two old goats cream" for arthritis pain  . VENTOLIN HFA 108 (90 Base) MCG/ACT inhaler INHALE TWO PUFFS INTO THE LUNGS EVERY 4 HOURS AS NEEDED FOR WHEEZING OR SHORTNESS OF BREATH  . montelukast (SINGULAIR) 10 MG tablet Take 1  tablet (10 mg total) by mouth at bedtime.  . predniSONE (DELTASONE) 20 MG tablet Take 2 tablets (40 mg total) by mouth daily with breakfast.  . [DISCONTINUED] Cranberry-Vitamin C-Probiotic (AZO CRANBERRY) 250-30 MG TABS Take by mouth.  . [DISCONTINUED] montelukast (SINGULAIR) 10 MG tablet Take 1 tablet (10 mg total) by mouth at bedtime.   No facility-administered encounter medications on file as of 11/03/2017.          Objective:   Physical Exam  Constitutional: She is oriented to person, place, and time. She appears well-developed and well-nourished.  HENT:  Head: Normocephalic and atraumatic.  Right Ear: External ear normal.  Left Ear: External ear normal.  Nose: Nose normal.  Mouth/Throat: Oropharynx is clear and moist.  TMs and canals are clear overall.  She does have a little bit of wax blocking partial view of the TMs bilaterally.  But what I can see does not look erythematous and no drainage or fluid in the canal.  Eyes: Conjunctivae and EOM are normal. Pupils are equal, round, and reactive to light.  Neck: Neck supple. No thyromegaly present.  Cardiovascular: Normal rate, regular rhythm and normal heart sounds.  Pulmonary/Chest: Effort normal and breath sounds normal. She has no wheezes.  Lymphadenopathy:    She has no cervical adenopathy.  Neurological: She is alert and oriented to person, place, and time.  Skin: Skin is warm and dry.  Psychiatric: She has a normal mood and affect.        Assessment & Plan:  Asthma-moderate persistent-we will treat with 5 days of prednisone. Encouraged to restart singulair for the Spring.   Acute sinusitis -likely viral.  I do feel like she is getting better but now just has a lot of fullness in the ears.  Will treat with oral prednisone for 5 days.  This may help with her breathing as well as the ear fullness.  If she is not improving by the end of the week then please give [place] a call back and I will consider putting her on an  antibiotic.  Bilateral ear fullness-trial of prednisone.  Pap smear due but she declines. Hasn't been sexually active.

## 2017-11-04 DIAGNOSIS — K146 Glossodynia: Secondary | ICD-10-CM | POA: Diagnosis not present

## 2017-11-04 DIAGNOSIS — E7849 Other hyperlipidemia: Secondary | ICD-10-CM | POA: Diagnosis not present

## 2017-11-05 LAB — COMPLETE METABOLIC PANEL WITH GFR
AG Ratio: 1.7 (calc) (ref 1.0–2.5)
ALBUMIN MSPROF: 4 g/dL (ref 3.6–5.1)
ALKALINE PHOSPHATASE (APISO): 65 U/L (ref 33–130)
ALT: 15 U/L (ref 6–29)
AST: 15 U/L (ref 10–35)
BILIRUBIN TOTAL: 0.3 mg/dL (ref 0.2–1.2)
BUN: 23 mg/dL (ref 7–25)
CHLORIDE: 107 mmol/L (ref 98–110)
CO2: 26 mmol/L (ref 20–32)
Calcium: 9.2 mg/dL (ref 8.6–10.4)
Creat: 0.68 mg/dL (ref 0.50–1.05)
GFR, EST AFRICAN AMERICAN: 116 mL/min/{1.73_m2} (ref >=60)
GFR, Est Non African American: 100 mL/min/{1.73_m2} (ref >=60)
GLOBULIN: 2.3 g/dL (ref 1.9–3.7)
GLUCOSE: 94 mg/dL (ref 65–99)
Potassium: 4.7 mmol/L (ref 3.5–5.3)
SODIUM: 140 mmol/L (ref 135–146)
TOTAL PROTEIN: 6.3 g/dL (ref 6.1–8.1)

## 2017-11-05 LAB — LIPID PANEL
CHOLESTEROL: 193 mg/dL (ref <200)
HDL: 49 mg/dL — AB (ref >50)
LDL CHOLESTEROL (CALC): 119 mg/dL — AB
Non-HDL Cholesterol (Calc): 144 mg/dL (calc) — ABNORMAL HIGH (ref <130)
TRIGLYCERIDES: 132 mg/dL (ref <150)
Total CHOL/HDL Ratio: 3.9 (calc) (ref <5.0)

## 2017-11-05 LAB — VITAMIN B12: VITAMIN B 12: 496 pg/mL (ref 200–1100)

## 2017-11-08 ENCOUNTER — Encounter: Payer: Self-pay | Admitting: Family Medicine

## 2018-01-13 ENCOUNTER — Other Ambulatory Visit: Payer: Self-pay | Admitting: *Deleted

## 2018-01-13 DIAGNOSIS — J4540 Moderate persistent asthma, uncomplicated: Secondary | ICD-10-CM

## 2018-01-13 MED ORDER — ALBUTEROL SULFATE HFA 108 (90 BASE) MCG/ACT IN AERS: INHALATION_SPRAY | RESPIRATORY_TRACT | 6 refills | Status: DC

## 2018-02-02 ENCOUNTER — Ambulatory Visit (INDEPENDENT_AMBULATORY_CARE_PROVIDER_SITE_OTHER): Payer: Medicare HMO | Admitting: Family Medicine

## 2018-02-02 ENCOUNTER — Encounter: Payer: Self-pay | Admitting: Family Medicine

## 2018-02-02 VITALS — BP 136/82 | HR 67 | Ht 63.0 in | Wt 245.0 lb

## 2018-02-02 DIAGNOSIS — J4540 Moderate persistent asthma, uncomplicated: Secondary | ICD-10-CM | POA: Diagnosis not present

## 2018-02-02 DIAGNOSIS — J069 Acute upper respiratory infection, unspecified: Secondary | ICD-10-CM | POA: Diagnosis not present

## 2018-02-02 DIAGNOSIS — M160 Bilateral primary osteoarthritis of hip: Secondary | ICD-10-CM | POA: Diagnosis not present

## 2018-02-02 MED ORDER — MELOXICAM 15 MG PO TABS: 15.0000 mg | ORAL_TABLET | Freq: Every day | ORAL | 1 refills | Status: DC | PRN

## 2018-02-02 MED ORDER — MELOXICAM 15 MG PO TABS
15.0000 mg | ORAL_TABLET | Freq: Every day | ORAL | 1 refills | Status: DC | PRN
Start: 2018-02-02 — End: 2018-02-08

## 2018-02-02 NOTE — Progress Notes (Signed)
   Subjective:    Patient ID: Robin [name], female    DOB: [birth year], 52 y.o.   MRN: [id number]  HPI 52 year old female is here today to follow-up on asthma.  She is currently on Breo and then albuterol as needed. She has had a cough and thinks got I from her grand children.   For about a week she had a persistent cough some increased shortness of breath and some more intermittent wheezing.  She is also had some nasal congestion and some sinus pressure and headaches behind both eyes.  No fevers chills or sweats.  She is not really currently taking any medication.  She is using her Brio daily and she is on the max dose.  She is also been on her Singulair for the spring and says that she really has noticed a difference and felt like it has been helping her.  She was doing well right before she got 6.  She is only using her rescue inhaler about once a week.  But since she is been sick over the last week she is been grabbing at 3-4 times a week.  Bilateral hip osteoarthritis-she says most days she ends up taking 2 of the meloxicam so would like to just go ahead and switch to the 15 mg tab and just take it 1 time a day to help control her pain and arthritis.   Review of Systems     Objective:   Physical Exam  Constitutional: She is oriented to person, place, and time. She appears well-developed and well-nourished.  HENT:  Head: Normocephalic and atraumatic.  Right Ear: External ear normal.  Left Ear: External ear normal.  Nose: Nose normal.  Mouth/Throat: Oropharynx is clear and moist.  TMs and canals are clear.   Eyes: Pupils are equal, round, and reactive to light. Conjunctivae and EOM are normal.  Neck: Neck supple. No thyromegaly present.  Cardiovascular: Normal rate, regular rhythm and normal heart sounds.  Pulmonary/Chest: Effort normal and breath sounds normal. She has no wheezes.  Lymphadenopathy:    She has no cervical adenopathy.  Neurological: She is alert and oriented to person,  place, and time.  Skin: Skin is warm and dry.  Psychiatric: She has a normal mood and affect. Her behavior is normal.       Assessment & Plan:  Asthma, moderate persistant- using Breo at max dose.  Continue current regimen.  Right now she does not want to actively treat the increase in symptoms.  She really wants to hold off on any type of antibiotic or prednisone and she is feeling a little better today than she was earlier this week so I think that that is reasonable but certainly she can call me at any point in time if we need to call this in for her.  In the meantime continue with Brio Singulair and albuterol as needed.  Follow-up in 4 months.  Acute URI -likely viral but since it has been exacerbating her asthma I did offer to put her on prednisone and antibiotics and she is Artie had symptoms for a week.  She declined but says she will call me back if she is not continuing to improve.  Bilat hip OA -increase meloxicam to 15 mg daily since most days she is Artie taking 2 of the 7.5 mg tabs.  New prescription sent to pharmacy.   He is also due for colon cancer screening. She did receive her Cologuard at home.

## 2018-02-02 NOTE — Addendum Note (Signed)
Addended by: Deno EtienneBARKLEY, Peggi Yono L on: 02/02/2018 10:03 AM   Modules accepted: Orders

## 2018-02-08 ENCOUNTER — Other Ambulatory Visit: Payer: Self-pay | Admitting: *Deleted

## 2018-02-08 DIAGNOSIS — M160 Bilateral primary osteoarthritis of hip: Secondary | ICD-10-CM

## 2018-02-08 MED ORDER — MELOXICAM 15 MG PO TABS: 15.0000 mg | ORAL_TABLET | Freq: Every day | ORAL | 1 refills | Status: DC | PRN

## 2018-02-10 ENCOUNTER — Encounter: Payer: Self-pay | Admitting: Family Medicine

## 2018-02-11 NOTE — Telephone Encounter (Signed)
Please call the pharmacy and see why they will not dispense the Mobic.  We Robin [name] corrected this prescription 3 days ago so I am not sure why they still have not dispensed it to her.  It says that the E prescribe was received.

## 2018-02-17 ENCOUNTER — Encounter: Payer: Self-pay | Admitting: Family Medicine

## 2018-02-17 MED ORDER — AZITHROMYCIN 250 MG PO TABS: ORAL_TABLET | ORAL | 0 refills | Status: AC

## 2018-02-21 NOTE — Telephone Encounter (Signed)
Called and left VM advising pt of RX

## 2018-03-10 ENCOUNTER — Ambulatory Visit (INDEPENDENT_AMBULATORY_CARE_PROVIDER_SITE_OTHER): Payer: Medicare HMO | Admitting: Family Medicine

## 2018-03-10 ENCOUNTER — Encounter: Payer: Self-pay | Admitting: Family Medicine

## 2018-03-10 ENCOUNTER — Ambulatory Visit (INDEPENDENT_AMBULATORY_CARE_PROVIDER_SITE_OTHER): Payer: Medicare HMO

## 2018-03-10 VITALS — BP 126/79 | HR 89 | Temp 98.2°F | Wt 247.0 lb

## 2018-03-10 DIAGNOSIS — X501XXA Overexertion from prolonged static or awkward postures, initial encounter: Secondary | ICD-10-CM

## 2018-03-10 DIAGNOSIS — S99812A Other specified injuries of left ankle, initial encounter: Secondary | ICD-10-CM | POA: Diagnosis not present

## 2018-03-10 DIAGNOSIS — M25572 Pain in left ankle and joints of left foot: Secondary | ICD-10-CM

## 2018-03-10 DIAGNOSIS — S93402A Sprain of unspecified ligament of left ankle, initial encounter: Secondary | ICD-10-CM | POA: Diagnosis not present

## 2018-03-10 NOTE — Patient Instructions (Addendum)
Thank you for coming in today. Use the boot as needed.  Transition to an ASO ankle brace (lace up) when able.  Recheck in 2 weeks.  If doing well bring both shoes to the next visit.   Ice can help.    Ankle Sprain, Phase I Rehab Ask your health care provider which exercises are safe for you. Do exercises exactly as told by your health care provider and adjust them as directed. It is normal to feel mild stretching, pulling, tightness, or discomfort as you do these exercises, but you should stop right away if you feel sudden pain or your pain gets worse.Do not begin these exercises until told by your health care provider. Stretching and range of motion exercises These exercises warm up your muscles and joints and improve the movement and flexibility of your lower leg and ankle. These exercises also help to relieve pain and stiffness. Exercise A: Gastroc and soleus stretch  1. Sit on the floor with your left / right leg extended. 2. Loop a belt or towel around the ball of your left / right foot. The ball of your foot is on the walking surface, right under your toes. 3. Keep your left / right ankle and foot relaxed and keep your knee straight while you use the belt or towel to pull your foot toward you. You should feel a gentle stretch behind your calf or knee. 4. Hold this position for __________ seconds, then release to the starting position. Repeat the exercise with your knee bent. You can put a pillow or a rolled bath towel under your knee to support it. You should feel a stretch deep in your calf or at your Achilles tendon. Repeat each stretch __________ times. Complete these stretches __________ times a day. Exercise B: Ankle alphabet  1. Sit with your left / right leg supported at the lower leg. ? Do not rest your foot on anything. ? Make sure your foot has room to move freely. 2. Think of your left / right foot as a paintbrush, and move your foot to trace each letter of the alphabet in  the air. Keep your hip and knee still while you trace. Make the letters as large as you can without feeling discomfort. 3. Trace every letter from A to Z. Repeat __________ times. Complete this exercise __________ times a day. Strengthening exercises These exercises build strength and endurance in your ankle and lower leg. Endurance is the ability to use your muscles for a long time, even after they get tired. Exercise C: Dorsiflexors  1. Secure a rubber exercise band or tube to an object, such as a table leg, that will stay still when the band is pulled. Secure the other end around your left / right foot. 2. Sit on the floor facing the object, with your left / right leg extended. The band or tube should be slightly tense when your foot is relaxed. 3. Slowly bring your foot toward you, pulling the band tighter. 4. Hold this position for __________ seconds. 5. Slowly return your foot to the starting position. Repeat __________ times. Complete this exercise __________ times a day. Exercise D: Plantar flexors  1. Sit on the floor with your left / right leg extended. 2. Loop a rubber exercise tube or band around the ball of your left / right foot. The ball of your foot is on the walking surface, right under your toes. ? Hold the ends of the band or tube in your hands. ?  The band or tube should be slightly tense when your foot is relaxed. 3. Slowly point your foot and toes downward, pushing them away from you. 4. Hold this position for __________ seconds. 5. Slowly return your foot to the starting position. Repeat __________ times. Complete this exercise __________ times a day. Exercise E: Evertors 1. Sit on the floor with your legs straight out in front of you. 2. Loop a rubber exercise band or tube around the ball of your left / right foot. The ball of your foot is on the walking surface, right under your toes. ? Hold the ends of the band in your hands, or secure the band to a stable  object. ? The band or tube should be slightly tense when your foot is relaxed. 3. Slowly push your foot outward, away from your other leg. 4. Hold this position for __________ seconds. 5. Slowly return your foot to the starting position. Repeat __________ times. Complete this exercise __________ times a day. This information is not intended to replace advice given to you by your health care provider. Make sure you discuss any questions you have with your health care provider. Document Released: 03/11/2005 Document Revised: 04/16/2016 Document Reviewed: 06/24/2015 [employer] Interactive Patient Education  2018 [employer]

## 2018-03-11 NOTE — Progress Notes (Signed)
Robin [name] is a 52 y.o. female who presents to [facility] today for left ankle injury.  Calling tripped over a rolled up fence today.  She suffered an injury to her anterior ankle.  She notes pain and some swelling to the anterior aspect of the ankle.  She has pain is worse with ambulation.  She denies any radiating pain weakness or numbness fevers or chills.  She denies that she has not tried treatment yet.  She notes pain is worse with ambulation and she is limping.  She is self-employed as a Engineer, water and notes that concerned that she will have to miss work.    ROS:  As above  Exam:  BP 126/79 (BP Location: Left Arm, Patient Position: Sitting, Cuff Size: Normal)   Pulse 89   Temp 98.2 F (36.8 C) (Oral)   Wt 247 lb (112 kg)   BMI 43.75 kg/m  General: Well Developed, well nourished, and in no acute distress.  Neuro/Psych: Alert and oriented x3, extra-ocular muscles intact, able to move all 4 extremities, sensation grossly intact. Skin: Warm and dry, no rashes noted.  Respiratory: Not using accessory muscles, speaking in full sentences, trachea midline.  Cardiovascular: Pulses palpable, no extremity edema. Abdomen: Does not appear distended. MSK:  Left ankle: Slight skin abrasion at the anterior ankle.  Mildly swollen at the anterior ankle.  No ecchymosis or erythema present. Ankle motion diminished especially with dorsiflexion plantarflexion limited by pain. Tender to palpation along the anterior aspect of the ankle ATFL area and His exam. Pulses capillary fill and sensation are intact. Strength is intact but motion is limited by pain.    Lab and Radiology Results X-ray left ankle images personally independently reviewed No acute fractures.  No significant DJD. Awaiting for radiology review    Assessment and Plan: 52 y.o. female with  Left ankle sprain.  Discussed treatment options.  Plan for cam walker boot and transition  to ASO when able.  Home exercise program and prescription strength NSAIDs as needed. Recheck in 2 weeks.  Return sooner if needed.    Orders Placed This Encounter  Procedures  . DG Ankle Complete Left    Standing Status:   Future    Number of Occurrences:   1    Standing Expiration Date:   05/12/2019    Order Specific Question:   Reason for Exam (SYMPTOM  OR DIAGNOSIS REQUIRED)    Answer:   eval inury left ankle    Order Specific Question:   Is patient pregnant?    Answer:   No    Order Specific Question:   Preferred imaging location?    Answer:   [facility]    Order Specific Question:   Radiology Contrast Protocol - do NOT remove file path    Answer:   [email or web address]   No orders of the defined types were placed in this encounter.   Historical information moved to improve visibility of documentation.  Past Medical History:  Diagnosis Date  . Arthritis   . Asthma    Flonase,Singulair,Advair daily  . Convulsion (HCC)    as a baby   . COPD (chronic obstructive pulmonary disease) (HCC)    uses Albuterol daily as needed  . Dysphagia   . GERD (gastroesophageal reflux disease)   . History of bronchitis    last time 77yrs ago  . History of kidney stones   . Hyperlipidemia    borderline no meds required  . Joint  pain   . Joint swelling   . Shortness of breath    lying/sitting/exertion   Past Surgical History:  Procedure Laterality Date  . CHOLECYSTECTOMY  2002  . ERCP    . LUMBAR LAMINECTOMY  1994   L4-5  . TOTAL HIP ARTHROPLASTY Left 11/24/2013   Procedure: LEFT TOTAL HIP ARTHROPLASTY;  Surgeon: Nestor LewandowskyFrank J Rowan, MD;  Location: [facility] OR;  Service: Orthopedics;  Laterality: Left;  . TOTAL HIP ARTHROPLASTY Right 02/05/2014   Procedure: RIGHT TOTAL HIP ARTHROPLASTY;  Surgeon: Nestor LewandowskyFrank J Rowan, MD;  Location: [facility] OR;  Service: Orthopedics;  Laterality: Right;   Social History   Tobacco Use  . Smoking status: Former Smoker     Packs/day: 1.00    Years: 25.00    Pack years: 25.00    Types: Cigarettes    Last attempt to quit: 01/30/2004    Years since quitting: 14.1  . Smokeless tobacco: Never Used  . Tobacco comment: quit 8-1763yrs ago  Substance Use Topics  . Alcohol use: No   family history includes Alcohol abuse in her brother and sister; Diabetes in her unknown relative; Heart attack in her father.  Medications: Current Outpatient Medications  Medication Sig Dispense Refill  . acetaminophen (TYLENOL) 650 MG CR tablet Take 650 mg by mouth every 8 (eight) hours as needed for pain.    Marland Kitchen. albuterol (VENTOLIN HFA) 108 (90 Base) MCG/ACT inhaler INHALE TWO PUFFS INTO THE LUNGS EVERY 4 HOURS AS NEEDED FOR WHEEZING OR SHORTNESS OF BREATH 18 each 6  . Cranberry-Vitamin C-Probiotic (AZO CRANBERRY) 250-30 MG TABS Take 1 tablet by mouth daily as needed.    . fluticasone furoate-vilanterol (BREO ELLIPTA) 200-25 MCG/INH AEPB Inhale 1 puff into the lungs daily. 60 each 6  . levocetirizine (XYZAL) 5 MG tablet Take 1 tablet (5 mg total) by mouth every evening. 90 tablet 1  . meloxicam (MOBIC) 15 MG tablet Take 1 tablet (15 mg total) by mouth daily as needed for pain. 90 tablet 1  . montelukast (SINGULAIR) 10 MG tablet Take 1 tablet (10 mg total) by mouth at bedtime. 90 tablet 1  . omeprazole (PRILOSEC OTC) 20 MG tablet Take 20 mg by mouth daily.    Marland Kitchen. OVER THE COUNTER MEDICATION Apply 1 application topically at bedtime. "Two old goats cream" for arthritis pain     No current facility-administered medications for this visit.    Allergies  Allergen Reactions  . Fluticasone Other (See Comments)    Headache (tolerates Advair with no reaction)       Discussed warning signs or symptoms. Please see discharge instructions. Patient expresses understanding.

## 2018-03-24 ENCOUNTER — Ambulatory Visit (INDEPENDENT_AMBULATORY_CARE_PROVIDER_SITE_OTHER): Payer: Medicare HMO | Admitting: Family Medicine

## 2018-03-24 VITALS — BP 122/81 | HR 61

## 2018-03-24 DIAGNOSIS — M25572 Pain in left ankle and joints of left foot: Secondary | ICD-10-CM

## 2018-03-24 NOTE — Progress Notes (Signed)
Robin [name] is a 52 y.o. female who presents to [facility] today for left ankle sprain.  Robin [name] was seen about 2 weeks ago for left ankle sprain.  She has been using an ASO brace and completing home exercise program.  She notes that she continues to have a little bit of pain but is much better.  She denies any further episodes of injury or instability.  She is back to her normal level of activity.    ROS:  As above  Exam:  BP 122/81   Pulse 61  General: Well Developed, well nourished, and in no acute distress.  Neuro/Psych: Alert and oriented x3, extra-ocular muscles intact, able to move all 4 extremities, sensation grossly intact. Skin: Warm and dry, no rashes noted.  Respiratory: Not using accessory muscles, speaking in full sentences, trachea midline.  Cardiovascular: Pulses palpable, no extremity edema. Abdomen: Does not appear distended. MSK:  Left ankle normal-appearing no effusion or erythema. Mildly tender to palpation left ATFL area. Stable ligamentous exam.  Intact strength.  Pulses capillary fill and sensation are intact.    Assessment and Plan: 52 y.o. female with left ankle pain following ankle sprain.  Considerable improvement.  Plan to proceed with further home exercise program including resistance bands and balance exercises.  Continue ASO brace.  Patient doing quite well.  Recheck in 6 weeks if not totally resolved.  Return sooner if needed.   I spent 15 minutes with this patient, greater than 50% was face-to-face time counseling regarding plan and home exercises.    Historical information moved to improve visibility of documentation.  Past Medical History:  Diagnosis Date  . Arthritis   . Asthma    Flonase,Singulair,Advair daily  . Convulsion (HCC)    as a baby   . COPD (chronic obstructive pulmonary disease) (HCC)    uses Albuterol daily as needed  . Dysphagia   . GERD (gastroesophageal reflux disease)     . History of bronchitis    last time 44yrs ago  . History of kidney stones   . Hyperlipidemia    borderline no meds required  . Joint pain   . Joint swelling   . Shortness of breath    lying/sitting/exertion   Past Surgical History:  Procedure Laterality Date  . CHOLECYSTECTOMY  2002  . ERCP    . LUMBAR LAMINECTOMY  1994   L4-5  . TOTAL HIP ARTHROPLASTY Left 11/24/2013   Procedure: LEFT TOTAL HIP ARTHROPLASTY;  Surgeon: Nestor Lewandowsky, MD;  Location: [facility] OR;  Service: Orthopedics;  Laterality: Left;  . TOTAL HIP ARTHROPLASTY Right 02/05/2014   Procedure: RIGHT TOTAL HIP ARTHROPLASTY;  Surgeon: Nestor Lewandowsky, MD;  Location: [facility] OR;  Service: Orthopedics;  Laterality: Right;   Social History   Tobacco Use  . Smoking status: Former Smoker    Packs/day: 1.00    Years: 25.00    Pack years: 25.00    Types: Cigarettes    Last attempt to quit: 01/30/2004    Years since quitting: 14.1  . Smokeless tobacco: Never Used  . Tobacco comment: quit 8-49yrs ago  Substance Use Topics  . Alcohol use: No   family history includes Alcohol abuse in her brother and sister; Diabetes in her unknown relative; Heart attack in her father.  Medications: Current Outpatient Medications  Medication Sig Dispense Refill  . acetaminophen (TYLENOL) 650 MG CR tablet Take 650 mg by mouth every 8 (eight) hours as needed for pain.    Marland Kitchen  albuterol (VENTOLIN HFA) 108 (90 Base) MCG/ACT inhaler INHALE TWO PUFFS INTO THE LUNGS EVERY 4 HOURS AS NEEDED FOR WHEEZING OR SHORTNESS OF BREATH 18 each 6  . Cranberry-Vitamin C-Probiotic (AZO CRANBERRY) 250-30 MG TABS Take 1 tablet by mouth daily as needed.    . fluticasone furoate-vilanterol (BREO ELLIPTA) 200-25 MCG/INH AEPB Inhale 1 puff into the lungs daily. 60 each 6  . levocetirizine (XYZAL) 5 MG tablet Take 1 tablet (5 mg total) by mouth every evening. 90 tablet 1  . meloxicam (MOBIC) 15 MG tablet Take 1 tablet (15 mg total) by mouth daily as needed for pain. 90 tablet 1  .  montelukast (SINGULAIR) 10 MG tablet Take 1 tablet (10 mg total) by mouth at bedtime. 90 tablet 1  . omeprazole (PRILOSEC OTC) 20 MG tablet Take 20 mg by mouth daily.    Marland Kitchen. OVER THE COUNTER MEDICATION Apply 1 application topically at bedtime. "Two old goats cream" for arthritis pain     No current facility-administered medications for this visit.    Allergies  Allergen Reactions  . Fluticasone Other (See Comments)    Headache (tolerates Advair with no reaction)       Discussed warning signs or symptoms. Please see discharge instructions. Patient expresses understanding.

## 2018-03-24 NOTE — Patient Instructions (Signed)
Thank you for coming in today. Use the ankle brace with walking for 1 month and then with walking on unusable ground for 3 months.   Do the band exercises.    Recheck in 1 month especially if not all better.    Ankle Sprain, Phase II Rehab Ask your health care provider which exercises are safe for you. Do exercises exactly as told by your health care provider and adjust them as directed. It is normal to feel mild stretching, pulling, tightness, or discomfort as you do these exercises, but you should stop right away if you feel sudden pain or your pain gets worse.Do not begin these exercises until told by your health care provider. Stretching and range of motion exercises These exercises warm up your muscles and joints and improve the movement and flexibility of your lower leg and ankle. These exercises also help to relieve pain and stiffness. Exercise A: Gastroc stretch, standing  1. Stand with your hands against a wall. 2. Extend your left / right leg behind you, and bend your front knee slightly. Your heels should be on the floor. 3. Keeping your heels on the floor and your back knee straight, shift your weight toward the wall. You should feel a gentle stretch in the back of your lower leg (calf). 4. Hold this position for __________ seconds. Repeat __________ times. Complete this exercise __________ times a day. Exercise B: Soleus stretch, standing 1. Stand with your hands against a wall. 2. Extend your left / right leg behind you, and bend your front knee slightly. Both of your heels should be on the floor. 3. Keeping your heels on the floor, bend your back knee and shift your weight slightly over your back leg. You should feel a gentle stretch deep in your calf. 4. Hold this position for __________ seconds. Repeat __________ times. Complete this exercise __________ times a day. Strengthening exercises These exercises build strength and endurance in your lower leg. Endurance is the  ability to use your muscles for a long time, even after they get tired. Exercise C: Heel walking ( dorsiflexion) Walk on your heels for __________ seconds or ___________ ft. Keep your toes as high as possible. Repeat __________ times. Complete this exercise __________ times a day. Balance exercises These exercises improve your balance and the reaction and control of your ankle to help improve stability. Exercise D: Multi-angle lunge 1. Stand with your feet together. 2. Take a step forward with your left / right leg, and shift your weight onto that leg. Your back heel will come off the floor, and your back toes will stay in place. 3. Push off your front leg to return your front foot to the starting position next to your other foot. 4. Repeat to the side, to the back, and any other directions as told by your health care provider. Repeat in each direction __________ times. Complete this exercise __________ times a day. Exercise E: Single leg stand 1. Without shoes, stand near a railing or in a door frame. Hold onto the railing or door frame as needed. 2. Stand on your left / right foot. Keep your big toe down on the floor and try to keep your arch lifted. 3. Hold this position for __________ seconds. Repeat __________ times. Complete this exercise __________ times a day. If this exercise is too easy, you can try it with your eyes closed or while standing on a pillow. Exercise F: Inversion/eversion  You will need a balance board for this  exercise. Ask your health care provider where you can get a balance board or how you can make one. 1. Stand on a non-carpeted surface near a countertop or wall. 2. Step onto the balance board so your feet are hip-width apart. 3. Keep your feet in place and keep your upper body and hips steady. Using only your feet and ankles to move the board, do one or both of the following exercises as told by your health care provider: ? Tip the board side to side as far as  you can, alternating between tipping to the left and tipping to the right. If you can, tip the board so it silently taps the floor. Do not let the board forcefully hit the floor. From time to time, pause to hold a steady position. ? Tip the board side to side so the board does not hit the floor at all. From time to time, pause to hold a steady position. Repeat the movement for each exercise __________ times. Complete each exercise __________ times a day. Exercise G: Plantar flexion/dorsiflexion  You will need a balance board for this exercise. Ask your health care provider where you can get a balance board or how you can make one. 1. Stand on a non-carpeted surface near a countertop or wall. 2. Step onto the balance board so your feet are hip-width apart. 3. Keep your feet in place and keep your upper body and hips steady. Using only your feet and ankles to move the board, do one or both of the following exercises as told by your health care provider: ? Tip the board forward and backward so the board silently taps the floor. Do not let the board forcefully hit the floor. From time to time, pause to hold a steady position. ? Tip the board forward and backward so the board does not hit the floor at all. From time to time, pause to hold a steady position. Repeat the movement for each exercise __________ times. Complete each exercise __________ times a day. This information is not intended to replace advice given to you by your health care provider. Make sure you discuss any questions you have with your health care provider. Document Released: 11/30/2005 Document Revised: 04/16/2016 Document Reviewed: 06/24/2015 Elsevier Interactive Patient Education  2018 [employer].

## 2018-04-14 ENCOUNTER — Other Ambulatory Visit: Payer: Self-pay | Admitting: Family Medicine

## 2018-04-14 DIAGNOSIS — J4550 Severe persistent asthma, uncomplicated: Secondary | ICD-10-CM

## 2018-05-09 ENCOUNTER — Other Ambulatory Visit: Payer: Self-pay | Admitting: Family Medicine

## 2018-05-13 NOTE — Progress Notes (Signed)
Subjective:   Robin [name] is a 52 y.o. female who presents for an Initial Medicare Annual Wellness Visit.  Review of Systems    No ROS.  Medicare Wellness Visit. Additional risk factors are reflected in the social history.   Cardiac Risk Factors include: dyslipidemia Sleep patterns: Gets 8 hours a sleep at night. Gets up 1 times during night to urinate.   Home Safety/Smoke Alarms: Feels safe in home. Smoke alarms in place.  Living environment; Lives with sister and great niece in a one story. Shower is a step over shower, has a non slip mat in place, no grab bars in place.    Female:   Pap- utd       Mammo-  utd due 07/27/2019     Dexa scan- not due       CCS- pt declined      Objective:    Today's Vitals   05/17/18 0936  BP: 127/74  Pulse: 63  SpO2: 95%  Weight: 250 lb (113.4 kg)  Height: 5\' 3"  (1.6 m)   Body mass index is 44.29 kg/m.  Advanced Directives 05/17/2018 02/05/2014 01/29/2014 01/16/2014 11/26/2013 11/15/2013  Does Patient Have a Medical Advance Directive? No Patient does not have advance directive;Patient would not like information Patient does not have advance directive;Patient would not like information Patient does not have advance directive;Patient would not like information Patient does not have advance directive Patient does not have advance directive;Patient would like information  Would patient like information on creating a medical advance directive? Yes (MAU/Ambulatory/Procedural Areas - Information given) - - - - -  Pre-existing out of facility DNR order (yellow form or pink MOST form) - No - - No No    Current Medications (verified) Outpatient Encounter Medications as of 05/17/2018  Medication Sig  . acetaminophen (TYLENOL) 650 MG CR tablet Take 650 mg by mouth every 8 (eight) hours as needed for pain.  Marland Kitchen albuterol (VENTOLIN HFA) 108 (90 Base) MCG/ACT inhaler INHALE TWO PUFFS INTO THE LUNGS EVERY 4 HOURS AS NEEDED FOR WHEEZING OR SHORTNESS OF BREATH   . BREO ELLIPTA 200-25 MCG/INH AEPB INHALE 1 PUFF BY MOUTH ONCE DAILY  . Cranberry-Vitamin C-Probiotic (AZO CRANBERRY) 250-30 MG TABS Take 1 tablet by mouth daily as needed.  Marland Kitchen levocetirizine (XYZAL) 5 MG tablet TAKE 1 TABLET BY MOUTH ONCE DAILY IN THE EVENING  . meloxicam (MOBIC) 15 MG tablet Take 1 tablet (15 mg total) by mouth daily as needed for pain.  . montelukast (SINGULAIR) 10 MG tablet Take 1 tablet (10 mg total) by mouth at bedtime.  Marland Kitchen omeprazole (PRILOSEC OTC) 20 MG tablet Take 20 mg by mouth daily.  Marland Kitchen OVER THE COUNTER MEDICATION Apply 1 application topically at bedtime. "Two old goats cream" for arthritis pain   No facility-administered encounter medications on file as of 05/17/2018.     Allergies (verified) Fluticasone   History: Past Medical History:  Diagnosis Date  . Arthritis   . Asthma    Flonase,Singulair,Advair daily  . Convulsion (HCC)    as a baby   . COPD (chronic obstructive pulmonary disease) (HCC)    uses Albuterol daily as needed  . Dysphagia   . GERD (gastroesophageal reflux disease)   . History of bronchitis    last time 41yrs ago  . History of kidney stones   . Hyperlipidemia    borderline no meds required  . Joint pain   . Joint swelling   . Shortness of breath  lying/sitting/exertion   Past Surgical History:  Procedure Laterality Date  . CHOLECYSTECTOMY  2002  . ERCP    . LUMBAR LAMINECTOMY  1994   L4-5  . TOTAL HIP ARTHROPLASTY Left 11/24/2013   Procedure: LEFT TOTAL HIP ARTHROPLASTY;  Surgeon: Nestor LewandowskyFrank J Rowan, MD;  Location: [facility] OR;  Service: Orthopedics;  Laterality: Left;  . TOTAL HIP ARTHROPLASTY Right 02/05/2014   Procedure: RIGHT TOTAL HIP ARTHROPLASTY;  Surgeon: Nestor LewandowskyFrank J Rowan, MD;  Location: [facility] OR;  Service: Orthopedics;  Laterality: Right;   Family History  Problem Relation Age of Onset  . Alcohol abuse Brother   . Alcohol abuse Sister   . Diabetes Sister   . Heart attack Father   . Diabetes Unknown        grandparents.     Social History   Socioeconomic History  . Marital status: Widowed    Spouse name: Not on file  . Number of children: 1  . Years of education: 712  . Highest education level: 12th grade  Occupational History  . Occupation: Programmer, applicationsHouse Keeper  Social Needs  . Financial resource strain: Not hard at all  . Food insecurity:    Worry: Never true    Inability: Never true  . Transportation needs:    Medical: No    Non-medical: No  Tobacco Use  . Smoking status: Former Smoker    Packs/day: 1.00    Years: 25.00    Pack years: 25.00    Types: Cigarettes    Last attempt to quit: 01/30/2004    Years since quitting: 14.3  . Smokeless tobacco: Never Used  . Tobacco comment: quit 8-493yrs ago  Substance and Sexual Activity  . Alcohol use: No  . Drug use: No  . Sexual activity: Not Currently  Lifestyle  . Physical activity:    Days per week: 4 days    Minutes per session: 40 min  . Stress: Not at all  Relationships  . Social connections:    Talks on phone: More than three times a week    Gets together: More than three times a week    Attends religious service: More than 4 times per year    Active member of club or organization: No    Attends meetings of clubs or organizations: Never    Relationship status: Widowed  Other Topics Concern  . Not on file  Social History Narrative   Patient is currently trying to do intentional walking 40 minutes 3-4 times a day. Patient also does a lot of gardening.    Tobacco Counseling Counseling given: Not Answered Comment: quit 8-43yrs ago   Clinical Intake:  Pre-visit preparation completed: Yes  Pain : No/denies pain     Nutritional Risks: None Diabetes: No  How often do you need to have someone help you when you read instructions, pamphlets, or other written materials from your doctor or pharmacy?: 1 - Never  Interpreter Needed?: No      Activities of Daily Living In your present state of health, do you have any difficulty  performing the following activities: 05/17/2018  Hearing? Y  Comment normal hearing loss for her age per hearing doctor  Vision? N  Difficulty concentrating or making decisions? N  Walking or climbing stairs? N  Dressing or bathing? N  Doing errands, shopping? N  Preparing Food and eating ? N  Using the Toilet? N  In the past six months, have you accidently leaked urine? Y  Comment leaks urine, wears panti  liner  Do you have problems with loss of bowel control? N  Managing your Medications? N  Managing your Finances? N  Housekeeping or managing your Housekeeping? N  Some recent data might be hidden     Immunizations and Health Maintenance Immunization History  Administered Date(s) Administered  . Influenza,inj,Quad PF,6+ Mos 08/28/2014, 04/18/2015, 04/22/2016, 07/14/2017, 05/17/2018  . Pneumococcal Polysaccharide-23 11/25/2013  . Tdap 11/06/2015   Health Maintenance Due  Topic Date Due  . COLONOSCOPY  03/16/2014  . INFLUENZA VACCINE  03/24/2018    Patient Care Team: Agapito Games, MD as PCP - General (Family Medicine)  Indicate any recent Medical Services you may have received from other than [facility] providers in the past year (date may be approximate).     Assessment:   This is a routine wellness examination for [place]. Physical assessment deferred to PCP.   Hearing/Vision screen  Visual Acuity Screening   Right eye Left eye Both eyes  Without correction:     With correction: 20/25 20/30 20/20   Hearing Screening Comments: Whisper Test done- 3 words and repeated back  Dietary issues and exercise activities discussed: Current Exercise Habits: Home exercise routine, Type of exercise: walking, Time (Minutes): 40, Frequency (Times/Week): 3, Weekly Exercise (Minutes/Week): 120, Intensity: Mild, Exercise limited by: respiratory conditions(s);orthopedic condition(s) Diet Maintains healthy diet eats vegetables out of her garden. Breakfast: doesn't eat  breakfast Lunch: leftovers Dinner:  Chicken and salad     Goals    . DIET - INCREASE WATER INTAKE     Increase water intake. Drink at least 60 oz. Of water a day    . Exercise 150 min/wk Moderate Activity     Continue to walk daily and take albuterol inhaler with you in case you need it.      Depression Screen PHQ 2/9 Scores 05/17/2018 11/03/2017  PHQ - 2 Score 0 2  PHQ- 9 Score - 9    Fall Risk Fall Risk  05/17/2018  Falls in the past year? Yes  Comment fell in garden. Seen doctor had a sprain ankle  Number falls in past yr: 1  Injury with Fall? Yes  Comment ankle sprain  Follow up Falls prevention discussed    Is the patient's home free of loose throw rugs in walkways, pet beds, electrical cords, etc?   yes      Grab bars in the bathroom? no      Handrails on the stairs?   no      Adequate lighting?   yes  Cognitive Function:     6CIT Screen 05/17/2018  What Year? 0 points  What month? 0 points  What time? 0 points  Count back from 20 0 points  Months in reverse 0 points  Repeat phrase 2 points  Total Score 2    Screening Tests Health Maintenance  Topic Date Due  . COLONOSCOPY  03/16/2014  . INFLUENZA VACCINE  03/24/2018  . PAP SMEAR  11/04/2018 (Originally 03/16/1985)  . MAMMOGRAM  04/29/2019  . TETANUS/TDAP  11/05/2025  . Hepatitis C Screening  Completed  . HIV Screening  Completed     Plan:    Please schedule your next medicare wellness visit with me in 1 yr.  Ms. Fohl , Thank you for taking time to come for your Medicare Wellness Visit. I appreciate your ongoing commitment to your health goals. Please review the following plan we discussed and let me know if I can assist you in the future.  Continue to watch  low cholesterol diet as we discussed  These are the goals we discussed: Goals    . DIET - INCREASE WATER INTAKE     Increase water intake. Drink at least 60 oz. Of water a day    . Exercise 150 min/wk Moderate Activity     Continue to walk  daily and take albuterol inhaler with you in case you need it.       This is a list of the screening recommended for you and due dates:  Health Maintenance  Topic Date Due  . Colon Cancer Screening  03/16/2014  . Flu Shot  03/24/2018  . Pap Smear  11/04/2018*  . Mammogram  04/29/2019  . Tetanus Vaccine  11/05/2025  .  Hepatitis C: One time screening is recommended by Center for Disease Control  (CDC) for  adults born from [birth year] through 1967.   Completed  . HIV Screening  Completed  *Topic was postponed. The date shown is not the original due date.     I have personally reviewed and noted the following in the patient's chart:   . Medical and social history . Use of alcohol, tobacco or illicit drugs  . Current medications and supplements . Functional ability and status . Nutritional status . Physical activity . Advanced directives . List of other physicians . Hospitalizations, surgeries, and ER visits in previous 12 months . Vitals . Screenings to include cognitive, depression, and falls . Referrals and appointments  In addition, I have reviewed and discussed with patient certain preventive protocols, quality metrics, and best practice recommendations. A written personalized care plan for preventive services as well as general preventive health recommendations were provided to patient.     Normand Sloop, LPN   11/30/8117

## 2018-05-17 ENCOUNTER — Ambulatory Visit (INDEPENDENT_AMBULATORY_CARE_PROVIDER_SITE_OTHER): Payer: Medicare HMO | Admitting: *Deleted

## 2018-05-17 VITALS — BP 127/74 | HR 63 | Ht 63.0 in | Wt 250.0 lb

## 2018-05-17 DIAGNOSIS — Z23 Encounter for immunization: Secondary | ICD-10-CM

## 2018-05-17 DIAGNOSIS — Z0000 Encounter for general adult medical examination without abnormal findings: Secondary | ICD-10-CM | POA: Diagnosis not present

## 2018-05-17 NOTE — Patient Instructions (Addendum)
Please schedule your next medicare wellness visit with me in 1 yr.  [name] , Thank you for taking time to come for your Medicare Wellness Visit. I appreciate your ongoing commitment to your health goals. Please review the following plan we discussed and let me know if I can assist you in the future.  Continue to watch low cholesterol diet as we discussed These are the goals we discussed: Goals    . DIET - INCREASE WATER INTAKE     Increase water intake. Drink at least 60 oz. Of water a day    . Exercise 150 min/wk Moderate Activity     Continue to walk daily and take albuterol inhaler with you in case you need it.     Bring a copy of your living will and/or healthcare power of attorney to your next office visit.  Cholesterol Cholesterol is a fat. Your body needs a small amount of cholesterol. Cholesterol (plaque) may build up in your blood vessels (arteries). That makes you more likely to have a heart attack or stroke. You cannot feel your cholesterol level. Having a blood test is the only way to find out if your level is high. Keep your test results. Work with your doctor to keep your cholesterol at a good level. What do the results mean?  Total cholesterol is how much cholesterol is in your blood.  LDL is bad cholesterol. This is the type that can build up. Try to have low LDL.  HDL is good cholesterol. It cleans your blood vessels and carries LDL away. Try to have high HDL.  Triglycerides are fat that the body can store or burn for energy. What are good levels of cholesterol?  Total cholesterol below 200.  LDL below 100 is good for people who have health risks. LDL below 70 is good for people who have very high risks.  HDL above 40 is good. It is best to have HDL of 60 or higher.  Triglycerides below 150. How can I lower my cholesterol? Diet Follow your diet program as told by your doctor.  Choose fish, white meat chicken, or [place] that is roasted or baked. Try not  to eat red meat, fried foods, sausage, or lunch meats.  Eat lots of fresh fruits and vegetables.  Choose whole grains, beans, pasta, potatoes, and cereals.  Choose olive oil, corn oil, or canola oil. Only use small amounts.  Try not to eat butter, mayonnaise, shortening, or palm kernel oils.  Try not to eat foods with trans fats.  Choose low-fat or nonfat dairy foods. ? Drink skim or nonfat milk. ? Eat low-fat or nonfat yogurt and cheeses. ? Try not to drink whole milk or cream. ? Try not to eat ice cream, egg yolks, or full-fat cheeses.  Healthy desserts include angel food cake, ginger snaps, animal crackers, hard candy, popsicles, and low-fat or nonfat frozen yogurt. Try not to eat pastries, cakes, pies, and cookies.  Exercise Follow your exercise program as told by your doctor.  Be more active. Try gardening, walking, and taking the stairs.  Ask your doctor about ways that you can be more active.  Medicine  Take over-the-counter and prescription medicines only as told by your doctor. This information is not intended to replace advice given to you by your health care provider. Make sure you discuss any questions you have with your health care provider. Document Released: 11/06/2008 Document Revised: 03/11/2016 Document Reviewed: 02/20/2016 Elsevier Interactive Patient Education  [employer]2018 Elsevier Inc.

## 2018-05-26 ENCOUNTER — Encounter: Payer: Self-pay | Admitting: Family Medicine

## 2018-05-26 DIAGNOSIS — N950 Postmenopausal bleeding: Secondary | ICD-10-CM

## 2018-06-01 ENCOUNTER — Ambulatory Visit (INDEPENDENT_AMBULATORY_CARE_PROVIDER_SITE_OTHER): Payer: Medicare HMO | Admitting: Family Medicine

## 2018-06-01 ENCOUNTER — Encounter: Payer: Self-pay | Admitting: Family Medicine

## 2018-06-01 VITALS — BP 126/78 | HR 68 | Ht 63.0 in | Wt 249.0 lb

## 2018-06-01 DIAGNOSIS — Z6841 Body Mass Index (BMI) 40.0 and over, adult: Secondary | ICD-10-CM

## 2018-06-01 DIAGNOSIS — J4550 Severe persistent asthma, uncomplicated: Secondary | ICD-10-CM | POA: Diagnosis not present

## 2018-06-01 DIAGNOSIS — R0602 Shortness of breath: Secondary | ICD-10-CM | POA: Diagnosis not present

## 2018-06-01 DIAGNOSIS — R635 Abnormal weight gain: Secondary | ICD-10-CM

## 2018-06-01 MED ORDER — UMECLIDINIUM BROMIDE 62.5 MCG/INH IN AEPB: 1.0000 | INHALATION_SPRAY | Freq: Every day | RESPIRATORY_TRACT | 3 refills | Status: DC

## 2018-06-01 MED ORDER — PHENTERMINE HCL 15 MG PO CAPS: 15.0000 mg | ORAL_CAPSULE | ORAL | 0 refills | Status: DC

## 2018-06-01 MED ORDER — PREDNISONE 20 MG PO TABS: ORAL_TABLET | ORAL | 0 refills | Status: AC

## 2018-06-01 NOTE — Progress Notes (Signed)
Subjective:    Patient ID: Robin [name], female    DOB: May 01, 1966, 52 y.o.   MRN: [id number]  HPI F/U Asthma, 4 mo f/u/.  She feels like she has had more sxs the last couple of weeks. Says this has been typical for the season change for her and she has ben trying to walk more in the morning.  She is on Breo and albuterol.  She is on singulair and Xyzal.  Has been pretreating before exercise and says she still has to use her albuterol during exercise and then often at the end of exercise.  She is also had to wake up in the middle the night and use her albuterol twice in the last 2 weeks. She rarely uses her peak flow meter.  Not had any emergency department or urgent care visits because of her breathing.  Has her pap smear scheduled.   Abnormal weight gain - she has been unable to lose any weight.  She felt like she was much more active this summer and so she did not lose a single pound.  She is just little frustrated.  Review of Systems  BP 126/78   Pulse 68   Ht 5\' 3"  (1.6 m)   Wt 249 lb (112.9 kg)   SpO2 96%   BMI 44.11 kg/m     Allergies  Allergen Reactions  . Fluticasone Other (See Comments)    Headache (tolerates Advair with no reaction)     Past Medical History:  Diagnosis Date  . Arthritis   . Asthma    Flonase,Singulair,Advair daily  . Convulsion (HCC)    as a baby   . COPD (chronic obstructive pulmonary disease) (HCC)    uses Albuterol daily as needed  . Dysphagia   . GERD (gastroesophageal reflux disease)   . History of bronchitis    last time 7yrs ago  . History of kidney stones   . Hyperlipidemia    borderline no meds required  . Joint pain   . Joint swelling   . Shortness of breath    lying/sitting/exertion    Past Surgical History:  Procedure Laterality Date  . CHOLECYSTECTOMY  2002  . ERCP    . LUMBAR LAMINECTOMY  1994   L4-5  . TOTAL HIP ARTHROPLASTY Left 11/24/2013   Procedure: LEFT TOTAL HIP ARTHROPLASTY;  Surgeon: Nestor Lewandowsky, MD;   Location: [facility] OR;  Service: Orthopedics;  Laterality: Left;  . TOTAL HIP ARTHROPLASTY Right 02/05/2014   Procedure: RIGHT TOTAL HIP ARTHROPLASTY;  Surgeon: Nestor Lewandowsky, MD;  Location: [facility] OR;  Service: Orthopedics;  Laterality: Right;    Social History   Socioeconomic History  . Marital status: Widowed    Spouse name: Not on file  . Number of children: 1  . Years of education: 61  . Highest education level: 12th grade  Occupational History  . Occupation: Programmer, applications  Social Needs  . Financial resource strain: Not hard at all  . Food insecurity:    Worry: Never true    Inability: Never true  . Transportation needs:    Medical: No    Non-medical: No  Tobacco Use  . Smoking status: Former Smoker    Packs/day: 1.00    Years: 25.00    Pack years: 25.00    Types: Cigarettes    Last attempt to quit: 01/30/2004    Years since quitting: 14.3  . Smokeless tobacco: Never Used  . Tobacco comment: quit 8-50yrs ago  Substance and  Sexual Activity  . Alcohol use: No  . Drug use: No  . Sexual activity: Not Currently  Lifestyle  . Physical activity:    Days per week: 4 days    Minutes per session: 40 min  . Stress: Not at all  Relationships  . Social connections:    Talks on phone: More than three times a week    Gets together: More than three times a week    Attends religious service: More than 4 times per year    Active member of club or organization: No    Attends meetings of clubs or organizations: Never    Relationship status: Widowed  . Intimate partner violence:    Fear of current or ex partner: No    Emotionally abused: No    Physically abused: No    Forced sexual activity: No  Other Topics Concern  . Not on file  Social History Narrative   Patient is currently trying to do intentional walking 40 minutes 3-4 times a day. Patient also does a lot of gardening.    Family History  Problem Relation Age of Onset  . Alcohol abuse Brother   . Alcohol abuse Sister   .  Diabetes Sister   . Heart attack Father   . Diabetes Unknown        grandparents.     Outpatient Encounter Medications as of 06/01/2018  Medication Sig  . acetaminophen (TYLENOL) 650 MG CR tablet Take 650 mg by mouth every 8 (eight) hours as needed for pain.  Marland Kitchen albuterol (VENTOLIN HFA) 108 (90 Base) MCG/ACT inhaler INHALE TWO PUFFS INTO THE LUNGS EVERY 4 HOURS AS NEEDED FOR WHEEZING OR SHORTNESS OF BREATH  . BREO ELLIPTA 200-25 MCG/INH AEPB INHALE 1 PUFF BY MOUTH ONCE DAILY  . Cranberry-Vitamin C-Probiotic (AZO CRANBERRY) 250-30 MG TABS Take 1 tablet by mouth daily as needed.  Marland Kitchen levocetirizine (XYZAL) 5 MG tablet TAKE 1 TABLET BY MOUTH ONCE DAILY IN THE EVENING  . meloxicam (MOBIC) 15 MG tablet Take 1 tablet (15 mg total) by mouth daily as needed for pain.  . montelukast (SINGULAIR) 10 MG tablet Take 1 tablet (10 mg total) by mouth at bedtime.  Marland Kitchen omeprazole (PRILOSEC OTC) 20 MG tablet Take 20 mg by mouth daily.  Marland Kitchen OVER THE COUNTER MEDICATION Apply 1 application topically at bedtime. "Two old goats cream" for arthritis pain  . phentermine 15 MG capsule Take 1 capsule (15 mg total) by mouth every morning.  . predniSONE (DELTASONE) 20 MG tablet Take 2 tablets (40 mg total) by mouth daily with breakfast for 3 days, THEN 1 tablet (20 mg total) daily with breakfast for 3 days, THEN 0.5 tablets (10 mg total) daily with breakfast for 2 days.  Marland Kitchen umeclidinium bromide (INCRUSE ELLIPTA) 62.5 MCG/INH AEPB Inhale 1 puff into the lungs daily. See coupon card   No facility-administered encounter medications on file as of 06/01/2018.          Objective:   Physical Exam  Constitutional: She is oriented to person, place, and time. She appears well-developed and well-nourished.  HENT:  Head: Normocephalic and atraumatic.  Cardiovascular: Normal rate, regular rhythm and normal heart sounds.  Pulmonary/Chest: Effort normal and breath sounds normal.  Expiratory wheeze at the left lower lung base  posteriorly.  Neurological: She is alert and oriented to person, place, and time.  Skin: Skin is warm and dry.  Psychiatric: She has a normal mood and affect. Her behavior is normal.  Assessment & Plan:  Asthma, severe persistent-currently on Breo, PRN albuterol which she is using about 3 times per day.  In fact she had to use it twice at night in the last 2 weeks.  She is also on Singulair and Xyzal.  We will put her on prednisone for short course in addition to adding Incruse.F/U in 4 weeks. We discussed taht wieght loss would help her breathing.     Abnormal weight gain -discussed options including medications. Her insurance won't cover any of the new weight loss medication.  Will start low dose phentermine.  F/U in 4 weeks. Continue to work on walking.  Continue to watch diet.  She eats very low carbs overall.  He does not eat out and eats a lot of vegetables that were going in her garden.

## 2018-06-13 ENCOUNTER — Other Ambulatory Visit (HOSPITAL_COMMUNITY)
Admission: RE | Admit: 2018-06-13 | Discharge: 2018-06-13 | Disposition: A | Discharge status: Home / Self Care | Payer: Medicare HMO | Source: Ambulatory Visit | Attending: Obstetrics & Gynecology | Admitting: Obstetrics & Gynecology

## 2018-06-13 ENCOUNTER — Ambulatory Visit: Payer: Medicare HMO | Admitting: Obstetrics & Gynecology

## 2018-06-13 ENCOUNTER — Encounter: Payer: Self-pay | Admitting: Obstetrics & Gynecology

## 2018-06-13 VITALS — BP 127/80 | HR 75 | Ht 63.0 in | Wt 243.0 lb

## 2018-06-13 DIAGNOSIS — N870 Mild cervical dysplasia: Secondary | ICD-10-CM | POA: Diagnosis not present

## 2018-06-13 DIAGNOSIS — Z01419 Encounter for gynecological examination (general) (routine) without abnormal findings: Secondary | ICD-10-CM

## 2018-06-13 DIAGNOSIS — N889 Noninflammatory disorder of cervix uteri, unspecified: Secondary | ICD-10-CM | POA: Diagnosis not present

## 2018-06-13 NOTE — Progress Notes (Signed)
   Subjective:    Patient ID: Robin [name], female    DOB: 01/12/1966, 52 y.o.   MRN: [id number]  HPI 52 year old female presents with postmenopausal bleeding.  Patient was referred from Dr. Nani Gasser.  Patient has been in menopause for years and then had heavy bleeding in October.  She is not sexually active at this time.  It was not associated with breast tenderness or cramping.  Is not associate with bowel movements or urination.  The patient has not had a Pap smear in over 20 years.  Patient is also due for mammogram.   Review of Systems  Constitutional: Negative.   Respiratory: Negative.   Cardiovascular: Negative.   Gastrointestinal: Negative.   Endocrine: Negative.   Genitourinary: Positive for vaginal bleeding.  Psychiatric/Behavioral: Negative.        Objective:   Physical Exam  Constitutional: She is oriented to person, place, and time. She appears well-developed and well-nourished. No distress.  HENT:  Head: Normocephalic and atraumatic.  Eyes: Conjunctivae are normal.  Cardiovascular: Normal rate.  Pulmonary/Chest: Effort normal.  Abdominal: Soft. Bowel sounds are normal. She exhibits no distension and no mass. There is no tenderness. There is no rebound and no guarding.  Genitourinary:    Genitourinary Comments: Tanner V [place] is pal3 pink with normal rugae.  No discharge or bleeding Cervix has a red slightly nodular lesion at 6:00.  Musculoskeletal: She exhibits no edema.  Neurological: She is alert and oriented to person, place, and time.  Skin: Skin is warm and dry.  Psychiatric: She has a normal mood and affect.  Vitals reviewed.  Vitals:   06/13/18 0915  BP: 127/80  Pulse: 75  Weight: 243 lb (110.2 kg)  Height: 5\' 3"  (1.6 m)     Assessment & Plan:  52 year old female with postmenopausal bleeding in need of well woman exam as well as addressing the bleeding.    1.  Pap smear and cervical biopsy done.  2 biopsies were taken from the  patient's cervix after verbal consent.  The biopsies were taken near 6:00.  There was a small to moderate amount of bleeding.  Lugol's and silver nitrate were used to help control with hemostasis.  A sponge was placed in the vagina for 10 minutes and then afterwards there was no bleeding. 2.  We will wait for pathology.  If normal will proceed with transvaginal ultrasound. 3.  Mammogram ordered 4.

## 2018-06-13 NOTE — Progress Notes (Signed)
Pt hasn't had a period in 4 years and then began bleeding the first of October for 8 days  Last pap probably 20 years ago- normal Last mammogram 04/28/17- normal

## 2018-06-15 LAB — CYTOLOGY - PAP
Diagnosis: NEGATIVE
HPV: NOT DETECTED

## 2018-06-28 ENCOUNTER — Other Ambulatory Visit: Payer: Self-pay | Admitting: Obstetrics & Gynecology

## 2018-06-28 DIAGNOSIS — N950 Postmenopausal bleeding: Secondary | ICD-10-CM

## 2018-06-28 NOTE — Progress Notes (Signed)
Cervical biopsy CIN 1. Pt needs TVUS to evaluate uterus (PMB) and a follow up appt for results.  I would also like to do a colpo since the biopsy showed CIN 1 and was taken without the colposcope.

## 2018-06-29 ENCOUNTER — Ambulatory Visit (INDEPENDENT_AMBULATORY_CARE_PROVIDER_SITE_OTHER): Payer: Medicare HMO | Admitting: Family Medicine

## 2018-06-29 ENCOUNTER — Telehealth: Payer: Self-pay | Admitting: Family Medicine

## 2018-06-29 ENCOUNTER — Telehealth: Payer: Self-pay | Admitting: *Deleted

## 2018-06-29 ENCOUNTER — Encounter: Payer: Self-pay | Admitting: Family Medicine

## 2018-06-29 VITALS — BP 129/74 | HR 68 | Temp 98.5°F | Ht 62.5 in | Wt 244.0 lb

## 2018-06-29 DIAGNOSIS — R635 Abnormal weight gain: Secondary | ICD-10-CM

## 2018-06-29 DIAGNOSIS — J4550 Severe persistent asthma, uncomplicated: Secondary | ICD-10-CM

## 2018-06-29 DIAGNOSIS — Z6841 Body Mass Index (BMI) 40.0 and over, adult: Secondary | ICD-10-CM

## 2018-06-29 MED ORDER — PHENTERMINE HCL 15 MG PO CAPS: 15.0000 mg | ORAL_CAPSULE | ORAL | 1 refills | Status: DC

## 2018-06-29 NOTE — Telephone Encounter (Signed)
LM on voicemail that she is scheduled for her TVU and needs appt with Dr Penne Lash to do a colposcopy and review results.  Advised pt to call and schedule for 11/18

## 2018-06-29 NOTE — Telephone Encounter (Signed)
Called our representative and she will come by on 06/30/2018 to bring samples and let me know how to apply for assistance if patient needs to do so.

## 2018-06-29 NOTE — Telephone Encounter (Signed)
Please call pharmaceutical rep and see if we are able to get any samples of Incruse or Breo.  I have a patient who has had her Medicare gap.  They are going to apply for patient assistance but if we are able to get them a couple of samples in the meantime that would be really helpful.

## 2018-06-29 NOTE — Progress Notes (Signed)
   Subjective:    Patient ID: Robin [name], female    DOB: Oct 15, 1965, 52 y.o.   MRN: [id number]  HPI 52 year old female is here today for follow-up for severe persistent asthma.  When I saw her 4 weeks ago on Breo, Singulair, Xyzal she was using her albuterol up to 3 times a day and at night.  I gave her a round of prednisone for acute exacerbation of her asthma in addition to adding Incruse to her regular regimen.  Unfortunately the increase is quite expensive.  She has had her Medicare gap and so is having some difficulty affording it but says it has actually been really helpful in her breathing.  She is not grabbing her albuterol nearly as frequently.  She did complete the course of prednisone as well and says that was helpful as well.   Abnormal weight gain /BMI 43 -she is doing well overall.  She tolerated the phentermine well.  She said she did have a little bit of palpitations and heart racing when she was taking the prednisone and the phentermine together.  She has not been able to walk as much because of some stiffness she is been working in her garden a lot though.  Review of Systems     Objective:   Physical Exam  Constitutional: She is oriented to person, place, and time. She appears well-developed and well-nourished.  HENT:  Head: Normocephalic and atraumatic.  Cardiovascular: Normal rate, regular rhythm and normal heart sounds.  Pulmonary/Chest: Effort normal and breath sounds normal.  Neurological: She is alert and oriented to person, place, and time.  Skin: Skin is warm and dry.  Psychiatric: She has a normal mood and affect. Her behavior is normal.        Assessment & Plan:  Asthma, severe persistent-she is actually doing better and I feel like she is back to her baseline.  We discussed may be even switching her from a combination of Breo and Incruse to Trelegy if her insurance will cover it.  She will check on that.  She could also consider discontinuing the Incruse  through the winter months if she is doing really well and that may be restarting it in the spring.  Either way she can let me know which she would prefer.  We will try to see if we can get her samples of the Incruse at least for the month of December and then after that she will have a deductible with her insurance.  Abnormal weight gain/BMI 40-I did go ahead and refill her phentermine today.  Will need to follow-up in 6 weeks for nurse visit for blood pressure and weight check.  Just continue to work on increased activity level and diet.

## 2018-07-05 ENCOUNTER — Ambulatory Visit (INDEPENDENT_AMBULATORY_CARE_PROVIDER_SITE_OTHER): Payer: Medicare HMO

## 2018-07-05 DIAGNOSIS — N950 Postmenopausal bleeding: Secondary | ICD-10-CM

## 2018-07-07 ENCOUNTER — Encounter: Payer: Self-pay | Admitting: Obstetrics & Gynecology

## 2018-07-07 DIAGNOSIS — Z8249 Family history of ischemic heart disease and other diseases of the circulatory system: Secondary | ICD-10-CM | POA: Insufficient documentation

## 2018-07-07 DIAGNOSIS — N870 Mild cervical dysplasia: Secondary | ICD-10-CM | POA: Insufficient documentation

## 2018-07-07 DIAGNOSIS — J4550 Severe persistent asthma, uncomplicated: Secondary | ICD-10-CM | POA: Insufficient documentation

## 2018-07-07 DIAGNOSIS — J4530 Mild persistent asthma, uncomplicated: Secondary | ICD-10-CM | POA: Insufficient documentation

## 2018-07-07 DIAGNOSIS — N950 Postmenopausal bleeding: Secondary | ICD-10-CM | POA: Insufficient documentation

## 2018-07-11 ENCOUNTER — Encounter: Payer: Self-pay | Admitting: Obstetrics & Gynecology

## 2018-07-11 ENCOUNTER — Ambulatory Visit: Payer: Medicare HMO | Admitting: Obstetrics & Gynecology

## 2018-07-11 VITALS — BP 127/84 | HR 78 | Resp 16 | Ht 63.0 in | Wt 240.0 lb

## 2018-07-11 DIAGNOSIS — N840 Polyp of corpus uteri: Secondary | ICD-10-CM

## 2018-07-11 DIAGNOSIS — N870 Mild cervical dysplasia: Secondary | ICD-10-CM | POA: Diagnosis not present

## 2018-07-11 DIAGNOSIS — R87612 Low grade squamous intraepithelial lesion on cytologic smear of cervix (LGSIL): Secondary | ICD-10-CM

## 2018-07-11 DIAGNOSIS — R9389 Abnormal findings on diagnostic imaging of other specified body structures: Secondary | ICD-10-CM

## 2018-07-11 DIAGNOSIS — N950 Postmenopausal bleeding: Secondary | ICD-10-CM | POA: Diagnosis not present

## 2018-07-11 DIAGNOSIS — Z3202 Encounter for pregnancy test, result negative: Secondary | ICD-10-CM

## 2018-07-11 DIAGNOSIS — N889 Noninflammatory disorder of cervix uteri, unspecified: Secondary | ICD-10-CM

## 2018-07-11 DIAGNOSIS — Z01812 Encounter for preprocedural laboratory examination: Secondary | ICD-10-CM

## 2018-07-11 LAB — POCT URINE PREGNANCY: Preg Test, Ur: NEGATIVE

## 2018-07-11 NOTE — Progress Notes (Signed)
Patient ID: Robin [name], female   DOB: September 08, 1965, 52 y.o.   MRN: [id number]  Chief Complaint  Patient presents with  . Colposcopy    HPI Robin [name] is a 52 y.o. female.  CIN 1 on naked eye biopsy.   HPI  Indications: Pap smear on October 2019 showed: no abnormalities.  Past Medical History:  Diagnosis Date  . Arthritis   . Asthma    Flonase,Singulair,Advair daily  . Convulsion (HCC)    as a baby   . COPD (chronic obstructive pulmonary disease) (HCC)    uses Albuterol daily as needed  . Dysphagia   . GERD (gastroesophageal reflux disease)   . History of bronchitis    last time 57yrs ago  . History of kidney stones   . Hyperlipidemia    borderline no meds required  . Joint pain   . Joint swelling   . Shortness of breath    lying/sitting/exertion    Past Surgical History:  Procedure Laterality Date  . CHOLECYSTECTOMY  2002  . ERCP    . LUMBAR LAMINECTOMY  1994   L4-5  . TOTAL HIP ARTHROPLASTY Left 11/24/2013   Procedure: LEFT TOTAL HIP ARTHROPLASTY;  Surgeon: Nestor Lewandowsky, MD;  Location: [facility];  Service: Orthopedics;  Laterality: Left;  . TOTAL HIP ARTHROPLASTY Right 02/05/2014   Procedure: RIGHT TOTAL HIP ARTHROPLASTY;  Surgeon: Nestor Lewandowsky, MD;  Location: [facility] OR;  Service: Orthopedics;  Laterality: Right;    Family History  Problem Relation Age of Onset  . Alcohol abuse Brother   . Alcohol abuse Sister   . Diabetes Sister   . Heart attack Father   . Diabetes Unknown        grandparents.   . Breast cancer Maternal Aunt        post menopausal    Social History Social History   Tobacco Use  . Smoking status: Former Smoker    Packs/day: 1.00    Years: 25.00    Pack years: 25.00    Types: Cigarettes    Last attempt to quit: 01/30/2004    Years since quitting: 14.4  . Smokeless tobacco: Never Used  . Tobacco comment: quit 8-52yrs ago  Substance Use Topics  . Alcohol use: No  . Drug use: No    Allergies  Allergen Reactions  . Fluticasone Other (See  Comments)    Headache (tolerates Advair with no reaction)     Current Outpatient Medications  Medication Sig Dispense Refill  . acetaminophen (TYLENOL) 650 MG CR tablet Take 650 mg by mouth every 8 (eight) hours as needed for pain.    Marland Kitchen albuterol (VENTOLIN HFA) 108 (90 Base) MCG/ACT inhaler INHALE TWO PUFFS INTO THE LUNGS EVERY 4 HOURS AS NEEDED FOR WHEEZING OR SHORTNESS OF BREATH 18 each 6  . BREO ELLIPTA 200-25 MCG/INH AEPB INHALE 1 PUFF BY MOUTH ONCE DAILY 60 each 6  . Cranberry-Vitamin C-Probiotic (AZO CRANBERRY) 250-30 MG TABS Take 1 tablet by mouth daily as needed.    Marland Kitchen levocetirizine (XYZAL) 5 MG tablet TAKE 1 TABLET BY MOUTH ONCE DAILY IN THE EVENING 90 tablet 1  . meloxicam (MOBIC) 15 MG tablet Take 1 tablet (15 mg total) by mouth daily as needed for pain. 90 tablet 1  . montelukast (SINGULAIR) 10 MG tablet Take 1 tablet (10 mg total) by mouth at bedtime. 90 tablet 1  . omeprazole (PRILOSEC OTC) 20 MG tablet Take 20 mg by mouth daily.    Marland Kitchen OVER THE COUNTER  MEDICATION Apply 1 application topically at bedtime. "Two old goats cream" for arthritis pain    . phentermine 15 MG capsule Take 1 capsule (15 mg total) by mouth every morning. 30 capsule 1  . umeclidinium bromide (INCRUSE ELLIPTA) 62.5 MCG/INH AEPB Inhale 1 puff into the lungs daily. See coupon card 30 each 3   No current facility-administered medications for this visit.     Review of Systems Review of Systems  Blood pressure 127/84, pulse 78, resp. rate 16, height 5\' 3"  (1.6 m), weight 240 lb (108.9 kg), last menstrual period 05/24/2018.     Procedure Details  The risks and benefits of the procedure and Written informed consent obtained.  Speculum placed in vagina and excellent visualization of cervix achieved, cervix swabbed x 3 with acetic acid solution.  Cervix is very friable between 6 and 7:00 in bleeding.  This limited visualization of colposcopy.  Specimens: Biopsy at 7:00.  Complications: none.  Plan     Specimens labelled and sent to Pathology.      Robin [name] Robin [name] 07/11/2018, 2:10 PM

## 2018-07-11 NOTE — Progress Notes (Signed)
   Subjective:    Patient ID: Robin [name], female    DOB: Mar 31, 1966, 52 y.o.   MRN: [id number]  HPI  Patient is a 52 year old female who presents for follow-up of postmenopausal bleeding.  Patient is been in menopause for 4 years.  Patient needs colposcopy as last biopsy was taken on first exam.  Biopsy result was CIN-1.  Patient has continued to have bleeding.  Endometrial ultrasound revealed a 19 mm lining.  Review of Systems  Constitutional: Negative.   Genitourinary: Positive for vaginal bleeding.  Psychiatric/Behavioral: Negative.        Objective:   Physical Exam  Constitutional: She is oriented to person, place, and time. She appears well-developed and well-nourished. No distress.  HENT:  Head: Normocephalic and atraumatic.  Eyes: Conjunctivae are normal.  Cardiovascular: Normal rate.  Pulmonary/Chest: Effort normal.  Genitourinary:  Genitourinary Comments: Cervix from 6-7 o'clock is friable.  There is bleeding with application of acetic acid.  Looposcopy was limited due to the bleeding.  Biopsy was taken at 7 PM.  ECC also taken.  Musculoskeletal: She exhibits no edema.  Neurological: She is alert and oriented to person, place, and time.  Skin: Skin is warm and dry.  Psychiatric: She has a normal mood and affect.  Vitals reviewed.  Vitals:   07/11/18 1323  BP: 127/84  Pulse: 78  Resp: 16  Weight: 240 lb (108.9 kg)  Height: 5\' 3"  (1.6 m)       Assessment & Plan:  52 year old female with postmenopausal bleeding  Possibly performed.  See separate note.  Endometrial biopsy performed ENDOMETRIAL BIOPSY     The indications for endometrial biopsy were reviewed.   Risks of the biopsy including cramping, bleeding, infection, uterine perforation, inadequate specimen and need for additional procedures  were discussed. The patient states she understands and agrees to undergo procedure today. Consent was signed. Time out was performed. Urine HCG was negative. A sterile  speculum was placed in the patient's vagina and the cervix was prepped with Betadine. A single-toothed tenaculum was placed on the anterior lip of the cervix to stabilize it. The 3 mm pipelle was introduced into the endometrial cavity without difficulty to a depth of 8cm, and a moderate amount of tissue was obtained and sent to pathology. The instruments were removed from the patient's vagina. Minimal bleeding from the cervix was noted. The patient tolerated the procedure well. Routine post-procedure instructions were given to the patient. The patient will follow up to review the results and for further management.

## 2018-07-14 ENCOUNTER — Other Ambulatory Visit: Payer: Self-pay | Admitting: Family Medicine

## 2018-07-18 ENCOUNTER — Other Ambulatory Visit: Payer: Self-pay | Admitting: Obstetrics & Gynecology

## 2018-07-18 MED ORDER — MEDROXYPROGESTERONE ACETATE 10 MG PO TABS: 10.0000 mg | ORAL_TABLET | Freq: Every day | ORAL | 0 refills | Status: DC

## 2018-07-18 NOTE — Progress Notes (Signed)
Pt still having spotting.  Will order provera until D & C and LEEP is scheduled.

## 2018-07-20 ENCOUNTER — Encounter (HOSPITAL_COMMUNITY): Payer: Self-pay

## 2018-08-01 NOTE — Pre-Procedure Instructions (Signed)
Robin [name]  08/01/2018      [facility] - [place], [place] - [address] [place] [address] Phone: [phone] Fax: [phone]    Your procedure is scheduled on August 05, 2018.  Report to [facility] at 930 AM.  Call this number if you have problems the morning of surgery:  [phone]   Remember:  Do not eat or drink after midnight.    Take these medicines the morning of surgery with A SIP OF WATER  Omeprazole (prilosec) Tylenol-if needed Albuterol inhaler-if needed-bring with you  Breo Ellipta Inhaler Provera Incruse Ellipta inhaler  Beginning now (if not already stopped), stop Phenetermine and do not take the day of surgery  Beginning now, STOP taking any meloxicam (Mobic), Aspirin (unless otherwise instructed by your surgeon), Aleve, Naproxen, Ibuprofen, Motrin, Advil, Goody's, BC's, all herbal medications, fish oil, and all vitamins    Do not wear jewelry, make-up or nail polish.  Do not wear lotions, powders, or perfumes, or deodorant.  Do not shave 48 hours prior to surgery.    Do not bring valuables to the hospital.  [facility] is not responsible for any belongings or valuables.  Contacts, dentures or bridgework may not be worn into surgery.  Leave your suitcase in the car.  After surgery it may be brought to your room.  For patients admitted to the hospital, discharge time will be determined by your treatment team.  Patients discharged the day of surgery will not be allowed to drive home.    [facility]- Preparing For Surgery  Before surgery, you can play an important role. Because skin is not sterile, your skin needs to be as free of germs as possible. You can reduce the number of germs on your skin by washing with CHG (chlorahexidine gluconate) Soap before surgery.  CHG is an antiseptic cleaner which kills germs and bonds with the skin to continue killing germs even after washing.     Oral Hygiene is also important to reduce your risk of infection.  Remember - BRUSH YOUR TEETH THE MORNING OF SURGERY WITH YOUR REGULAR TOOTHPASTE  Please do not use if you have an allergy to CHG or antibacterial soaps. If your skin becomes reddened/irritated stop using the CHG.  Do not shave (including legs and underarms) for at least 48 hours prior to first CHG shower. It is OK to shave your face.  Please follow these instructions carefully.   1. Shower the NIGHT BEFORE SURGERY and the MORNING OF SURGERY with CHG.   2. If you chose to wash your hair, wash your hair first as usual with your normal shampoo.  3. After you shampoo, rinse your hair and body thoroughly to remove the shampoo.  4. Use CHG as you would any other liquid soap. You can apply CHG directly to the skin and wash gently with a scrungie or a clean washcloth.   5. Apply the CHG Soap to your body ONLY FROM THE NECK DOWN.  Do not use on open wounds or open sores. Avoid contact with your eyes, ears, mouth and genitals (private parts). Wash Face and genitals (private parts)  with your normal soap.  6. Wash thoroughly, paying special attention to the area where your surgery will be performed.  7. Thoroughly rinse your body with warm water from the neck down.  8. DO NOT shower/wash with your normal soap after using and rinsing off the CHG Soap.  9. Pat yourself  dry with a CLEAN TOWEL.  10. Wear CLEAN PAJAMAS to bed the night before surgery, wear comfortable clothes the morning of surgery  11. Place CLEAN SHEETS on your bed the night of your first shower and DO NOT SLEEP WITH PETS.  Day of Surgery:  Do not apply any deodorants/lotions.  Please wear clean clothes to the hospital/surgery center.   Remember to brush your teeth WITH YOUR REGULAR TOOTHPASTE.

## 2018-08-01 NOTE — Progress Notes (Addendum)
PCP: Nani Gasseratherine Metheney, MD  Cardiologist: pt denies  EKG: pt denies past year   Stress test: pt denies  ECHO: pt denies  Cardiac Cath: pt denies  Chest x-ray: pt denies past year-anesthesia Shonna Chock(Allison Zelenak PA-C) listened to lung sounds, no Chest x-ray required today  Patient takes phentermine-advised to hold beginning now, last dose today 08/02/18-anesthesia is aware

## 2018-08-02 ENCOUNTER — Encounter (HOSPITAL_COMMUNITY): Payer: Self-pay

## 2018-08-02 ENCOUNTER — Encounter (HOSPITAL_COMMUNITY)
Admission: RE | Admit: 2018-08-02 | Discharge: 2018-08-02 | Disposition: A | Discharge status: Home / Self Care | Payer: Medicare HMO | Source: Ambulatory Visit | Attending: Obstetrics & Gynecology | Admitting: Obstetrics & Gynecology

## 2018-08-02 ENCOUNTER — Other Ambulatory Visit: Payer: Self-pay

## 2018-08-02 DIAGNOSIS — Z7951 Long term (current) use of inhaled steroids: Secondary | ICD-10-CM | POA: Diagnosis not present

## 2018-08-02 DIAGNOSIS — Z79899 Other long term (current) drug therapy: Secondary | ICD-10-CM | POA: Diagnosis not present

## 2018-08-02 DIAGNOSIS — Z01812 Encounter for preprocedural laboratory examination: Secondary | ICD-10-CM

## 2018-08-02 DIAGNOSIS — E785 Hyperlipidemia, unspecified: Secondary | ICD-10-CM

## 2018-08-02 DIAGNOSIS — N950 Postmenopausal bleeding: Secondary | ICD-10-CM | POA: Diagnosis not present

## 2018-08-02 DIAGNOSIS — N840 Polyp of corpus uteri: Secondary | ICD-10-CM | POA: Diagnosis not present

## 2018-08-02 DIAGNOSIS — M1990 Unspecified osteoarthritis, unspecified site: Secondary | ICD-10-CM | POA: Diagnosis not present

## 2018-08-02 DIAGNOSIS — Z79890 Hormone replacement therapy (postmenopausal): Secondary | ICD-10-CM | POA: Diagnosis not present

## 2018-08-02 DIAGNOSIS — K219 Gastro-esophageal reflux disease without esophagitis: Secondary | ICD-10-CM | POA: Diagnosis not present

## 2018-08-02 DIAGNOSIS — N870 Mild cervical dysplasia: Secondary | ICD-10-CM | POA: Diagnosis not present

## 2018-08-02 DIAGNOSIS — J449 Chronic obstructive pulmonary disease, unspecified: Secondary | ICD-10-CM

## 2018-08-02 DIAGNOSIS — Z87891 Personal history of nicotine dependence: Secondary | ICD-10-CM | POA: Diagnosis not present

## 2018-08-02 DIAGNOSIS — Z6841 Body Mass Index (BMI) 40.0 and over, adult: Secondary | ICD-10-CM | POA: Diagnosis not present

## 2018-08-02 LAB — BASIC METABOLIC PANEL
Anion gap: 8 (ref 5–15)
BUN: 14 mg/dL (ref 6–20)
CO2: 24 mmol/L (ref 22–32)
Calcium: 9.4 mg/dL (ref 8.9–10.3)
Chloride: 108 mmol/L (ref 98–111)
Creatinine, Ser: 0.82 mg/dL (ref 0.44–1.00)
GFR calc non Af Amer: >60 mL/min (ref >60)
Glucose, Bld: 102 mg/dL — ABNORMAL HIGH (ref 70–99)
Potassium: 4.2 mmol/L (ref 3.5–5.1)
Sodium: 140 mmol/L (ref 135–145)

## 2018-08-02 LAB — CBC
HCT: 48.6 % — ABNORMAL HIGH (ref 36.0–46.0)
HEMOGLOBIN: 14.7 g/dL (ref 12.0–15.0)
MCH: 26.8 pg (ref 26.0–34.0)
MCHC: 30.2 g/dL (ref 30.0–36.0)
MCV: 88.7 fL (ref 80.0–100.0)
Platelets: 345 10*3/uL (ref 150–400)
RBC: 5.48 MIL/uL — ABNORMAL HIGH (ref 3.87–5.11)
RDW: 13.2 % (ref 11.5–15.5)
WBC: 7.8 10*3/uL (ref 4.0–10.5)
nRBC: 0 % (ref 0.0–0.2)

## 2018-08-02 NOTE — Progress Notes (Signed)
Anesthesia PAT Evaluation:   Case:  [id number] Date/Time:  08/05/18 1054   Procedures:      DILATATION & CURETTAGE/HYSTEROSCOPY WITH MYOSURE (N/A )     LOOP ELECTROSURGICAL EXCISION PROCEDURE (LEEP) (N/A )   Anesthesia type:  Choice   Pre-op diagnosis:      PMB     Polyp   Location:  [facility] ROOM 4 / [facility]   Surgeon:  Lesly Dukes, MD      DISCUSSION: Patient is a 52 year old female scheduled for the above procedure.   History includes severe persistent asthma, former smoker (quit '05), HLD, dyspnea, COPD, GERD. BMI is consistent with morbid obesity.   Patient reports asthma currently controlled on current regimen (symptoms generally triggered in the Spring and with URI). She denied cardiac issues. Denied prior cardiac testing. No known anesthesia issues. Last phentermine 08/02/18 (advised to hold until after surgery).   No acute asthma symptoms. She is compliant with her regimen. If no acute changes then I anticipate that she can proceed as planned.   VS: BP 115/73   Pulse 71   Temp 36.8 C (Oral)   Resp 18   Ht 5' 2.5" (1.588 m)   Wt 108.2 kg   LMP 05/24/2018   SpO2 99%   BMI 42.93 kg/m  Patient is a pleasant Caucasian female in NAD. Lungs clear. No wheezes. Heart RRR. No murmur.   PROVIDERS: Agapito Games, MD is PCP. Last visit 06/29/18.    LABS: Labs reviewed: Acceptable for surgery. (all labs ordered are listed, but only abnormal results are displayed)  Labs Reviewed  BASIC METABOLIC PANEL - Abnormal; Notable for the following components:      Result Value   Glucose, Bld 102 (*)    All other components within normal limits  CBC - Abnormal; Notable for the following components:   RBC 5.48 (*)    HCT 48.6 (*)    All other components within normal limits    EKG: Last EKG on 11/15/13 showed NSR with sinus arrhythmia.   CV: N/A   Past Medical History:  Diagnosis Date  . Arthritis   . Asthma    Flonase,Singulair,Advair daily  . Convulsion (HCC)    as a baby   . COPD (chronic obstructive pulmonary disease) (HCC)    uses Albuterol daily as needed  . Dysphagia   . GERD (gastroesophageal reflux disease)   . History of bronchitis    last time 19yrs ago  . History of kidney stones   . Hyperlipidemia    borderline no meds required  . Joint pain   . Joint swelling   . Shortness of breath    lying/sitting/exertion    Past Surgical History:  Procedure Laterality Date  . CHOLECYSTECTOMY  2002  . ERCP    . LUMBAR LAMINECTOMY  1994   L4-5  . TOTAL HIP ARTHROPLASTY Left 11/24/2013   Procedure: LEFT TOTAL HIP ARTHROPLASTY;  Surgeon: Nestor Lewandowsky, MD;  Location: [facility] OR;  Service: Orthopedics;  Laterality: Left;  . TOTAL HIP ARTHROPLASTY Right 02/05/2014   Procedure: RIGHT TOTAL HIP ARTHROPLASTY;  Surgeon: Nestor Lewandowsky, MD;  Location: [facility] OR;  Service: Orthopedics;  Laterality: Right;    MEDICATIONS: . acetaminophen (TYLENOL) 650 MG CR tablet  . albuterol (VENTOLIN HFA) 108 (90 Base) MCG/ACT inhaler  . BREO ELLIPTA 200-25 MCG/INH AEPB  . levocetirizine (XYZAL) 5 MG tablet  . medroxyPROGESTERone (PROVERA) 10 MG tablet  . meloxicam (MOBIC) 15 MG  tablet  . montelukast (SINGULAIR) 10 MG tablet  . omeprazole (PRILOSEC OTC) 20 MG tablet  . OVER THE COUNTER MEDICATION  . phentermine 15 MG capsule  . umeclidinium bromide (INCRUSE ELLIPTA) 62.5 MCG/INH AEPB   No current facility-administered medications for this encounter.     Velna Ochsllison Tron Flythe, PA-C [facility]Anesthesiology Phone [phone] 08/02/2018 4:54 PM

## 2018-08-02 NOTE — Anesthesia Preprocedure Evaluation (Addendum)
Anesthesia Evaluation  Patient identified by MRN, date of birth, ID band Patient awake    Reviewed: Allergy & Precautions, H&P , NPO status , Patient's Chart, lab work & pertinent test results, reviewed documented beta blocker date and time   Airway Mallampati: II  TM Distance: >3 FB Neck ROM: full    Dental no notable dental hx.    Pulmonary asthma , COPD,  COPD inhaler, former smoker,    Pulmonary exam normal breath sounds clear to auscultation       Cardiovascular Exercise Tolerance: Good negative cardio ROS   Rhythm:regular Rate:Normal     Neuro/Psych negative neurological ROS  negative psych ROS   GI/Hepatic Neg liver ROS, GERD  ,  Endo/Other  Morbid obesity  Renal/GU negative Renal ROS  negative genitourinary   Musculoskeletal  (+) Arthritis , Osteoarthritis,    Abdominal   Peds  Hematology negative hematology ROS (+)   Anesthesia Other Findings   Reproductive/Obstetrics negative OB ROS                            Anesthesia Physical Anesthesia Plan  ASA: III  Anesthesia Plan: General   Post-op Pain Management:    Induction: Intravenous  PONV Risk Score and Plan: 3 and Ondansetron, Dexamethasone and Treatment may vary due to age or medical condition  Airway Management Planned: LMA  Additional Equipment:   Intra-op Plan:   Post-operative Plan:   Informed Consent: I have reviewed the patients History and Physical, chart, labs and discussed the procedure including the risks, benefits and alternatives for the proposed anesthesia with the patient or authorized representative who has indicated his/her understanding and acceptance.   Dental Advisory Given  Plan Discussed with: CRNA, Anesthesiologist and Surgeon  Anesthesia Plan Comments: (PAT note written 08/02/2018 by Shonna ChockAllison Zelenak, PA-C.  History includes severe persistent asthma, former smoker (quit '05), HLD,  dyspnea, COPD, GERD. BMI is consistent with morbid obesity. )       Anesthesia Quick Evaluation

## 2018-08-04 ENCOUNTER — Encounter (HOSPITAL_COMMUNITY): Payer: Self-pay

## 2018-08-05 ENCOUNTER — Ambulatory Visit (HOSPITAL_COMMUNITY)
Admission: RE | Admit: 2018-08-05 | Discharge: 2018-08-05 | Disposition: A | Discharge status: Home / Self Care | Payer: Medicare HMO | Attending: Obstetrics & Gynecology | Admitting: Obstetrics & Gynecology

## 2018-08-05 ENCOUNTER — Ambulatory Visit (HOSPITAL_COMMUNITY): Payer: Medicare HMO | Admitting: Anesthesiology

## 2018-08-05 ENCOUNTER — Encounter (HOSPITAL_COMMUNITY)
Admission: RE | Disposition: A | Discharge status: Home / Self Care | Payer: Self-pay | Source: Home / Self Care | Attending: Obstetrics & Gynecology

## 2018-08-05 ENCOUNTER — Encounter (HOSPITAL_COMMUNITY): Payer: Self-pay

## 2018-08-05 ENCOUNTER — Other Ambulatory Visit: Payer: Self-pay

## 2018-08-05 ENCOUNTER — Ambulatory Visit (HOSPITAL_COMMUNITY): Payer: Medicare HMO | Admitting: Vascular Surgery

## 2018-08-05 DIAGNOSIS — E785 Hyperlipidemia, unspecified: Secondary | ICD-10-CM | POA: Insufficient documentation

## 2018-08-05 DIAGNOSIS — N950 Postmenopausal bleeding: Secondary | ICD-10-CM | POA: Insufficient documentation

## 2018-08-05 DIAGNOSIS — K219 Gastro-esophageal reflux disease without esophagitis: Secondary | ICD-10-CM | POA: Diagnosis not present

## 2018-08-05 DIAGNOSIS — J449 Chronic obstructive pulmonary disease, unspecified: Secondary | ICD-10-CM | POA: Diagnosis not present

## 2018-08-05 DIAGNOSIS — Z79890 Hormone replacement therapy (postmenopausal): Secondary | ICD-10-CM | POA: Insufficient documentation

## 2018-08-05 DIAGNOSIS — Z6841 Body Mass Index (BMI) 40.0 and over, adult: Secondary | ICD-10-CM | POA: Insufficient documentation

## 2018-08-05 DIAGNOSIS — Z87891 Personal history of nicotine dependence: Secondary | ICD-10-CM | POA: Insufficient documentation

## 2018-08-05 DIAGNOSIS — Z7951 Long term (current) use of inhaled steroids: Secondary | ICD-10-CM | POA: Diagnosis not present

## 2018-08-05 DIAGNOSIS — N840 Polyp of corpus uteri: Secondary | ICD-10-CM | POA: Diagnosis not present

## 2018-08-05 DIAGNOSIS — Z79899 Other long term (current) drug therapy: Secondary | ICD-10-CM | POA: Diagnosis not present

## 2018-08-05 DIAGNOSIS — M1990 Unspecified osteoarthritis, unspecified site: Secondary | ICD-10-CM | POA: Insufficient documentation

## 2018-08-05 DIAGNOSIS — N870 Mild cervical dysplasia: Secondary | ICD-10-CM | POA: Diagnosis not present

## 2018-08-05 HISTORY — PX: DILATATION & CURETTAGE/HYSTEROSCOPY WITH MYOSURE: SHX6511

## 2018-08-05 HISTORY — PX: LEEP: SHX91

## 2018-08-05 SURGERY — DILATATION & CURETTAGE/HYSTEROSCOPY WITH MYOSURE
Anesthesia: General | Site: Vagina

## 2018-08-05 MED ORDER — DEXAMETHASONE SODIUM PHOSPHATE 10 MG/ML IJ SOLN
INTRAMUSCULAR | Status: DC | PRN
  Administered 2018-08-05: 4 mg via INTRAVENOUS

## 2018-08-05 MED ORDER — ONDANSETRON HCL 4 MG/2ML IJ SOLN: 4.0000 mg | Freq: Once | INTRAMUSCULAR | Status: DC | PRN

## 2018-08-05 MED ORDER — MEPERIDINE HCL 25 MG/ML IJ SOLN: 6.2500 mg | INTRAMUSCULAR | Status: DC | PRN

## 2018-08-05 MED ORDER — SCOPOLAMINE 1 MG/3DAYS TD PT72
MEDICATED_PATCH | TRANSDERMAL | Status: AC
  Administered 2018-08-05: 1.5 mg via TRANSDERMAL
  Filled 2018-08-05: qty 1

## 2018-08-05 MED ORDER — DEXAMETHASONE SODIUM PHOSPHATE 4 MG/ML IJ SOLN
INTRAMUSCULAR | Status: AC
  Filled 2018-08-05: qty 1

## 2018-08-05 MED ORDER — PROPOFOL 10 MG/ML IV BOLUS
INTRAVENOUS | Status: AC
  Filled 2018-08-05: qty 20

## 2018-08-05 MED ORDER — LIDOCAINE HCL (CARDIAC) PF 100 MG/5ML IV SOSY
PREFILLED_SYRINGE | INTRAVENOUS | Status: DC | PRN
  Administered 2018-08-05: 100 mg via INTRAVENOUS

## 2018-08-05 MED ORDER — FENTANYL CITRATE (PF) 100 MCG/2ML IJ SOLN: 25.0000 ug | INTRAMUSCULAR | Status: DC | PRN

## 2018-08-05 MED ORDER — LIDOCAINE-EPINEPHRINE (PF) 1 %-1:200000 IJ SOLN
INTRAMUSCULAR | Status: AC
  Filled 2018-08-05: qty 30

## 2018-08-05 MED ORDER — IODINE STRONG (LUGOLS) 5 % PO SOLN
ORAL | Status: DC | PRN
  Administered 2018-08-05: 0.1 mL

## 2018-08-05 MED ORDER — LACTATED RINGERS IV SOLN
INTRAVENOUS | Status: DC
  Administered 2018-08-05: 12:00:00 via INTRAVENOUS
  Administered 2018-08-05: 125 mL/h via INTRAVENOUS

## 2018-08-05 MED ORDER — FERRIC SUBSULFATE 259 MG/GM EX SOLN
CUTANEOUS | Status: AC
  Filled 2018-08-05: qty 8

## 2018-08-05 MED ORDER — LIDOCAINE HCL (CARDIAC) PF 100 MG/5ML IV SOSY
PREFILLED_SYRINGE | INTRAVENOUS | Status: AC
  Filled 2018-08-05: qty 5

## 2018-08-05 MED ORDER — SCOPOLAMINE 1 MG/3DAYS TD PT72
1.0000 | MEDICATED_PATCH | Freq: Once | TRANSDERMAL | Status: DC
  Administered 2018-08-05: 1.5 mg via TRANSDERMAL

## 2018-08-05 MED ORDER — OXYCODONE HCL 5 MG PO TABS: 5.0000 mg | ORAL_TABLET | Freq: Once | ORAL | Status: DC | PRN

## 2018-08-05 MED ORDER — ACETIC ACID 5 % SOLN
Status: AC
  Filled 2018-08-05: qty 500

## 2018-08-05 MED ORDER — SODIUM CHLORIDE 0.9 % IR SOLN
Status: DC | PRN
  Administered 2018-08-05: 3000 mL

## 2018-08-05 MED ORDER — LIDOCAINE-EPINEPHRINE (PF) 1 %-1:200000 IJ SOLN
INTRAMUSCULAR | Status: DC | PRN
  Administered 2018-08-05: 10 mL

## 2018-08-05 MED ORDER — MIDAZOLAM HCL 2 MG/2ML IJ SOLN
INTRAMUSCULAR | Status: AC
  Filled 2018-08-05: qty 2

## 2018-08-05 MED ORDER — FERRIC SUBSULFATE SOLN
Status: DC | PRN
  Administered 2018-08-05: 1

## 2018-08-05 MED ORDER — SILVER NITRATE-POT NITRATE 75-25 % EX MISC
CUTANEOUS | Status: AC
  Filled 2018-08-05: qty 1

## 2018-08-05 MED ORDER — ONDANSETRON HCL 4 MG/2ML IJ SOLN
INTRAMUSCULAR | Status: DC | PRN
  Administered 2018-08-05: 4 mg via INTRAVENOUS

## 2018-08-05 MED ORDER — BUPIVACAINE-EPINEPHRINE (PF) 0.25% -1:200000 IJ SOLN
INTRAMUSCULAR | Status: AC
  Filled 2018-08-05: qty 30

## 2018-08-05 MED ORDER — OXYCODONE HCL 5 MG/5ML PO SOLN: 5.0000 mg | Freq: Once | ORAL | Status: DC | PRN

## 2018-08-05 MED ORDER — PROPOFOL 10 MG/ML IV BOLUS
INTRAVENOUS | Status: DC | PRN
  Administered 2018-08-05: 100 mg via INTRAVENOUS
  Administered 2018-08-05: 200 mg via INTRAVENOUS

## 2018-08-05 MED ORDER — IODINE STRONG (LUGOLS) 5 % PO SOLN
ORAL | Status: AC
  Filled 2018-08-05: qty 1

## 2018-08-05 MED ORDER — ONDANSETRON HCL 4 MG/2ML IJ SOLN
INTRAMUSCULAR | Status: AC
  Filled 2018-08-05: qty 2

## 2018-08-05 MED ORDER — FENTANYL CITRATE (PF) 100 MCG/2ML IJ SOLN
INTRAMUSCULAR | Status: DC | PRN
  Administered 2018-08-05 (×4): 25 ug via INTRAVENOUS

## 2018-08-05 MED ORDER — MIDAZOLAM HCL 2 MG/2ML IJ SOLN
INTRAMUSCULAR | Status: DC | PRN
  Administered 2018-08-05: 2 mg via INTRAVENOUS

## 2018-08-05 MED ORDER — ACETAMINOPHEN 160 MG/5ML PO SOLN: 325.0000 mg | ORAL | Status: DC | PRN

## 2018-08-05 MED ORDER — ACETAMINOPHEN 325 MG PO TABS: 325.0000 mg | ORAL_TABLET | ORAL | Status: DC | PRN

## 2018-08-05 MED ORDER — KETOROLAC TROMETHAMINE 30 MG/ML IJ SOLN
INTRAMUSCULAR | Status: AC
  Filled 2018-08-05: qty 1

## 2018-08-05 MED ORDER — FENTANYL CITRATE (PF) 100 MCG/2ML IJ SOLN
INTRAMUSCULAR | Status: AC
  Filled 2018-08-05: qty 2

## 2018-08-05 SURGICAL SUPPLY — 29 items
APPLICATOR COTTON TIP 6 STRL (MISCELLANEOUS) ×1 IMPLANT
APPLICATOR COTTON TIP 6IN STRL (MISCELLANEOUS) ×3
CATH ROBINSON RED A/P 16FR (CATHETERS) ×3 IMPLANT
DEVICE MYOSURE LITE (MISCELLANEOUS) IMPLANT
DEVICE MYOSURE REACH (MISCELLANEOUS) ×3 IMPLANT
ELECT BALL LEEP 5MM RED (ELECTRODE) ×3 IMPLANT
ELECT LOOP LEEP RND 15X12 GRN (CUTTING LOOP)
ELECT LOOP LEEP RND 20X12 WHT (CUTTING LOOP)
ELECT REM PT RETURN 9FT ADLT (ELECTROSURGICAL) ×3
ELECTRODE LOOP LP RND 15X12GRN (CUTTING LOOP) IMPLANT
ELECTRODE LOOP LP RND 20X12WHT (CUTTING LOOP) IMPLANT
ELECTRODE REM PT RTRN 9FT ADLT (ELECTROSURGICAL) ×1 IMPLANT
EXCISOR BIOPSY CONE MED FISHER (MISCELLANEOUS) ×3 IMPLANT
EXTENDER ELECT LOOP LEEP 10CM (CUTTING LOOP) ×3 IMPLANT
GLOVE BIO SURGEON STRL SZ7 (GLOVE) ×3 IMPLANT
GLOVE BIOGEL PI IND STRL 7.0 (GLOVE) ×2 IMPLANT
GLOVE BIOGEL PI INDICATOR 7.0 (GLOVE) ×4
GOWN STRL REUS W/TWL LRG LVL3 (GOWN DISPOSABLE) ×6 IMPLANT
HEMOSTAT SURGICEL 4X8 (HEMOSTASIS) ×3 IMPLANT
HIBICLENS CHG 4% 4OZ BTL (MISCELLANEOUS) ×3 IMPLANT
KIT PROCEDURE FLUENT (KITS) ×3 IMPLANT
NS IRRIG 1000ML POUR BTL (IV SOLUTION) ×3 IMPLANT
PACK VAGINAL MINOR WOMEN LF (CUSTOM PROCEDURE TRAY) ×3 IMPLANT
PAD OB MATERNITY 4.3X12.25 (PERSONAL CARE ITEMS) ×3 IMPLANT
PENCIL SMOKE EVAC W/HOLSTER (ELECTROSURGICAL) ×3 IMPLANT
SCOPETTES 8  STERILE (MISCELLANEOUS) ×4
SCOPETTES 8 STERILE (MISCELLANEOUS) ×2 IMPLANT
SEAL ROD LENS SCOPE MYOSURE (ABLATOR) ×3 IMPLANT
TOWEL OR 17X24 6PK STRL BLUE (TOWEL DISPOSABLE) ×6 IMPLANT

## 2018-08-05 NOTE — Op Note (Signed)
PREOPERATIVE DIAGNOSIS:  52 y.o. with postmenopausal bleeding, suspected endometrial polyp, and CIN 1 on cervical biopsy and ECC  POSTOPERATIVE DIAGNOSIS: The same  PROCEDURE: Hysteroscopy, Dilation and Curettage.  SURGEON:  Dr. Elsie LincolnKelly Sakai Heinle  INDICATIONS: 52 y.o. [id number]  here for scheduled surgery for D&C hysteroscopy and polypectomy.  Patient has cervical dysplasia with +ECC CIN 1 and LEEP also to be performed..   Risks of surgery were discussed with the patient including but not limited to: bleeding which may require transfusion; infection which may require antibiotics; injury to uterus or surrounding organs; need for additional procedures including laparotomy or laparoscopy; and other postoperative/anesthesia complications. Written informed consent was obtained.    FINDINGS:  A normal sized uterus.  Four endometrial polyps.  ANESTHESIA:   General, paracervical block  ESTIMATED BLOOD LOSS:  Less than 20 ml  SPECIMENS: Endometrial curettings sent to pathology  COMPLICATIONS:  None immediate.  PROCEDURE DETAILS:  The patient was taken to the operating room where general anesthesia was administered.  After an adequate timeout was performed, she was placed in the dorsal lithotomy position and examined; then prepped and draped in the sterile manner.   Her bladder was catheterized for an unmeasured amount of clear, yellow urine. A speculum was then placed in the patient's vagina and a single tooth tenaculum was applied to the anterior lip of the cervix.   The cervix was and dilated manually with Hagar metal dilators to accommodate the 5.5 mm diagnostic hysteroscope.  Once the cervix was dilated, the hysteroscope was inserted under direct visualization using saline as a suspension medium.  The uterine cavity was carefully examined, and the four endometrial polyps were moved with the MyoSure.  Once the polyps were removed the hysteroscope was removed.  A gentle D&C was performed with a sharp curette.   All instruments were removed from the patient's vagina.  An insulated speculum was then placed into the vagina and a paracervical block was performed lidocaine with 1:200,000 epinephrine.  The LEEP was performed with a medium Fisher cone biopsy tool.  Hemostasis was achieved with Monsel's.  The patient tolerated the procedure well and was taken to the recovery area awake, extubated and in stable condition.

## 2018-08-05 NOTE — Anesthesia Postprocedure Evaluation (Signed)
Anesthesia Post Note  Patient: [name]  Procedure(s) Performed: DILATATION & CURETTAGE/HYSTEROSCOPY WITH MYOSURE (N/A Vagina ) LOOP ELECTROSURGICAL EXCISION PROCEDURE (LEEP) (N/A Vagina )     Patient location during evaluation: PACU Anesthesia Type: General Level of consciousness: awake and alert Pain management: pain level controlled Vital Signs Assessment: post-procedure vital signs reviewed and stable Respiratory status: spontaneous breathing, nonlabored ventilation, respiratory function stable and patient connected to nasal cannula oxygen Cardiovascular status: blood pressure returned to baseline and stable Postop Assessment: no apparent nausea or vomiting Anesthetic complications: no    Last Vitals:  Vitals:   08/05/18 1323 08/05/18 1400  BP:  126/80  Pulse: 76 75  Resp: 15 20  Temp: 37.2 C   SpO2: 97% 97%    Last Pain:  Vitals:   08/05/18 1323  TempSrc: Oral  PainSc: 0-No pain   Pain Goal: Patients Stated Pain Goal: 5 (08/05/18 [id number])               Sakari Alkhatib

## 2018-08-05 NOTE — Anesthesia Procedure Notes (Signed)
Procedure Name: LMA Insertion Date/Time: 08/05/2018 11:14 AM Performed by: Elgie CongoMalinova, Jaycee Mckellips H, CRNA Pre-anesthesia Checklist: Patient identified, Emergency Drugs available, Suction available and Patient being monitored Patient Re-evaluated:Patient Re-evaluated prior to induction Oxygen Delivery Method: Circle system utilized Preoxygenation: Pre-oxygenation with 100% oxygen Induction Type: IV induction Ventilation: Mask ventilation without difficulty LMA: LMA inserted LMA Size: 4.0 Number of attempts: 1 Placement Confirmation: positive ETCO2 and breath sounds checked- equal and bilateral Tube secured with: Tape Dental Injury: Teeth and Oropharynx as per pre-operative assessment

## 2018-08-05 NOTE — Transfer of Care (Signed)
Immediate Anesthesia Transfer of Care Note  Patient: Vara Guardianolleen Gottsch  Procedure(s) Performed: DILATATION & CURETTAGE/HYSTEROSCOPY WITH MYOSURE (N/A Vagina ) LOOP ELECTROSURGICAL EXCISION PROCEDURE (LEEP) (N/A Vagina )  Patient Location: PACU  Anesthesia Type:General  Level of Consciousness: awake, alert  and oriented  Airway & Oxygen Therapy: Patient Spontanous Breathing and Patient connected to nasal cannula oxygen  Post-op Assessment: Report given to RN, Post -op Vital signs reviewed and stable and Patient moving all extremities  Post vital signs: Reviewed and stable  Last Vitals:  Vitals Value Taken Time  BP 112/91 08/05/2018 12:23 PM  Temp    Pulse 91 08/05/2018 12:27 PM  Resp 18 08/05/2018 12:27 PM  SpO2 97 % 08/05/2018 12:27 PM  Vitals shown include unvalidated device data.  Last Pain:  Vitals:   08/05/18 0942  TempSrc: Oral  PainSc: 3       Patients Stated Pain Goal: 5 (08/05/18 [id number])  Complications: No apparent anesthesia complications

## 2018-08-05 NOTE — Brief Op Note (Signed)
08/05/2018  12:26 PM  PATIENT:  Robin [name]  52 y.o. female  PRE-OPERATIVE DIAGNOSIS:  PMB Polyp  POST-OPERATIVE DIAGNOSIS:  PMB Polyp  PROCEDURE:  Procedure(s): DILATATION & CURETTAGE/HYSTEROSCOPY WITH MYOSURE (N/A) LOOP ELECTROSURGICAL EXCISION PROCEDURE (LEEP) (N/A)  SURGEON:  Surgeon(s) and Role:    * Lesly DukesLeggett, Lane Kjos H, MD - Primary  ANESTHESIA:   general  EBL:  10 mL   BLOOD ADMINISTERED:none  DRAINS: none   LOCAL MEDICATIONS USED:  LIDOCAINE   SPECIMEN:  Source of Specimen:  endometrium  DISPOSITION OF SPECIMEN:  PATHOLOGY  COUNTS:  YES  TOURNIQUET:  * No tourniquets in log *  DICTATION: .Note written in EPIC  PLAN OF CARE: Discharge to home after PACU  PATIENT DISPOSITION:  PACU - hemodynamically stable.   Delay start of Pharmacological VTE agent (>24hrs) due to surgical blood loss or risk of bleeding: not applicable

## 2018-08-05 NOTE — H&P (Signed)
Vara GuardianColleen Hett is an 52 y.o. female with post menopausal bleeding.  Pt also has CIN 1 on colpo and CIN on ECC.  Pt has not had a pap smear for >20 years until recently.  Lining is thickened and EMBx shows  Endometrium, biopsy - ENDOMETRIOID-TYPE POLYP(S) WITH FEATURES OF INFARCT. - BACKGROUND ATROPHIC-APPEARING ENDOMETRIUM. - THERE IS NO EVIDENCE OF HYPERPLASIA OR MALIGNANCY.  Endocervix, curettage - LOW GRADE SQUAMOUS INTRAEPITHELIAL LESION, CIN-I (MILD DYSPLASIA). - BENIGN ENDOCERVICAL-TYPE MUCOSA.   Cervix, biopsy, 7 o'clock - LOW GRADE SQUAMOUS INTRAEPITHELIAL LESION, CIN-I (MILD DYSPLASIA).  Specimen Gross and Clinical Information Specimen Comment 1. PMB 2. Abnormal uterine bleeding 3. PMB Specimen(s) Obtained: 1. Endocervix, curettage 2. Endometrium, biopsy 3. Cervix, biopsy, 7 o'clock Specimen Clinical Uterus  Measurements: 8.2 x 4.1 x 5.2 cm = volume: 91.4 mL. Heterogeneous echogenicity without focal mass  Endometrium  Thickness: 19 mm, abnormal. Markedly thickened, mildly heterogeneous, demonstrating internal blood flow on color Doppler imaging.  Right ovary  Not visualized on either transabdominal or endovaginal imaging, likely obscured by bowel  Left ovary  Measurements: 2.3 x 1.8 x 2.0 cm = volume: 4.3 mL. Normal morphology without mass  Other findings:  No free fluid or adnexal masses  IMPRESSION: Markedly thickened, heterogeneous and mildly hypervascular endometrial complex 19 mm thick.   Patient's last menstrual period was 05/24/2018.    Past Medical History:  Diagnosis Date  . Arthritis   . Asthma    Flonase,Singulair,Advair daily  . Convulsion (HCC)    as a baby   . COPD (chronic obstructive pulmonary disease) (HCC)    uses Albuterol daily as needed  . Dysphagia   . GERD (gastroesophageal reflux disease)   . History of bronchitis    last time 3670yrs ago  . History of kidney stones   . Hyperlipidemia    borderline no meds  required  . Joint pain   . Joint swelling   . Shortness of breath    lying/sitting/exertion    Past Surgical History:  Procedure Laterality Date  . CHOLECYSTECTOMY  2002  . ERCP    . LUMBAR LAMINECTOMY  1994   L4-5  . TOTAL HIP ARTHROPLASTY Left 11/24/2013   Procedure: LEFT TOTAL HIP ARTHROPLASTY;  Surgeon: Nestor LewandowskyFrank J Rowan, MD;  Location: [facility] OR;  Service: Orthopedics;  Laterality: Left;  . TOTAL HIP ARTHROPLASTY Right 02/05/2014   Procedure: RIGHT TOTAL HIP ARTHROPLASTY;  Surgeon: Nestor LewandowskyFrank J Rowan, MD;  Location: [facility] OR;  Service: Orthopedics;  Laterality: Right;    Family History  Problem Relation Age of Onset  . Alcohol abuse Brother   . Alcohol abuse Sister   . Diabetes Sister   . Heart attack Father   . Diabetes Other        grandparents.   . Breast cancer Maternal Aunt        post menopausal    Social History:  reports that she quit smoking about 14 years ago. Her smoking use included cigarettes. She has a 25.00 pack-year smoking history. She has never used smokeless tobacco. She reports that she does not drink alcohol or use drugs.  Allergies:  Allergies  Allergen Reactions  . Fluticasone Other (See Comments)    Headache (tolerates Advair with no reaction)     No medications prior to admission.    Review of Systems  Constitutional: Negative.   Respiratory: Negative.   Cardiovascular: Negative.   Gastrointestinal: Negative.     Last menstrual period 05/24/2018. Physical Exam  Vitals reviewed. Constitutional:  She is oriented to person, place, and time. She appears well-developed and well-nourished. No distress.  HENT:  Head: Normocephalic and atraumatic.  Eyes: Conjunctivae are normal.  Neck: Neck supple. No thyromegaly present.  Cardiovascular: Normal rate and regular rhythm.  Respiratory: Effort normal. She has wheezes.  Anesthesia team aware of lung exam and have made their own assessment.  GI: Soft. There is no abdominal tenderness. There is no rebound and  no guarding.  Musculoskeletal: Normal range of motion.        General: No tenderness.  Neurological: She is alert and oriented to person, place, and time.  Skin: Skin is warm and dry.  Psychiatric: She has a normal mood and affect.  Pelvic to be repeated as necessary in the OR.   Assessment/Plan: 52 yo female with PMB and CIN 1 on cervical biopsy and ECC. Pt to have diagnostic hysteroscipy, D & C, and possible polypectomy with Myosure. Pt consented for D & C, hysteroscopy, and possible polypectomy.  Pt also have LEEP.  Risks include but not limited to bleeding, infection, damage to uterus.  SCDs placed for DVT prophylaxis.  If there is perforation, the procedure will have to be stopped.  All questions answered.   Elsie Lincoln 08/05/2018, 7:38 AM

## 2018-08-06 ENCOUNTER — Encounter (HOSPITAL_COMMUNITY): Payer: Self-pay | Admitting: Obstetrics & Gynecology

## 2018-08-10 ENCOUNTER — Ambulatory Visit: Payer: Medicare HMO

## 2018-08-19 ENCOUNTER — Other Ambulatory Visit: Payer: Self-pay | Admitting: Family Medicine

## 2018-08-19 DIAGNOSIS — M160 Bilateral primary osteoarthritis of hip: Secondary | ICD-10-CM

## 2018-08-22 ENCOUNTER — Ambulatory Visit (INDEPENDENT_AMBULATORY_CARE_PROVIDER_SITE_OTHER): Payer: Medicare HMO | Admitting: Family Medicine

## 2018-08-22 VITALS — BP 127/80 | HR 71 | Temp 98.0°F | Wt 236.0 lb

## 2018-08-22 DIAGNOSIS — Z6841 Body Mass Index (BMI) 40.0 and over, adult: Secondary | ICD-10-CM | POA: Diagnosis not present

## 2018-08-22 DIAGNOSIS — R635 Abnormal weight gain: Secondary | ICD-10-CM

## 2018-08-22 MED ORDER — PHENTERMINE HCL 15 MG PO CAPS: 15.0000 mg | ORAL_CAPSULE | ORAL | 0 refills | Status: DC

## 2018-08-22 NOTE — Progress Notes (Signed)
Pt in today for blood pressure and weight check. Patient denies trouble sleeping, palpitations or medication problems. Patients last weight was 244LB, todays weight is 236 LB. A refill for phentermine WILL be faxed to pt's pharmacy. Pt advised to schedule a follow up with nurse/provider in 30 days.

## 2018-08-22 NOTE — Progress Notes (Signed)
Abnormal weight gain-she is actually doing really well.  BMI 42.  Continue current regimen with phentermine.  Blood pressure well regulated.  Follow-up in 1 month.  Nani Gasseratherine Cherye Gaertner, MD

## 2018-08-29 ENCOUNTER — Ambulatory Visit (INDEPENDENT_AMBULATORY_CARE_PROVIDER_SITE_OTHER): Payer: Medicare HMO | Admitting: Obstetrics & Gynecology

## 2018-08-29 ENCOUNTER — Encounter: Payer: Self-pay | Admitting: Obstetrics & Gynecology

## 2018-08-29 VITALS — BP 128/80 | HR 80 | Ht 62.0 in | Wt 237.0 lb

## 2018-08-29 DIAGNOSIS — N939 Abnormal uterine and vaginal bleeding, unspecified: Secondary | ICD-10-CM

## 2018-08-29 DIAGNOSIS — N879 Dysplasia of cervix uteri, unspecified: Secondary | ICD-10-CM

## 2018-08-29 NOTE — Progress Notes (Signed)
   Subjective:    Patient ID: Robin [name], female    DOB: Sep 26, 1965, 53 y.o.   MRN: [id number]  HPI 53 year old female presents after postmenopausal bleeding and had since he quit D&C, hysteroscopy, polypectomy, and LEEP.  Pathology is below.  Cervical biopsy in the office showed CIN-1.  The LEEP does not show any dysplasia.  Transformation zone was obtained. 1. Cervix, LEEP - ATROPHIC APPEARING TRANSITIONAL ZONE MUCOSA. - THERE IS NO EVIDENCE OF MALIGNANCY. - SEE COMMENT. 2. Endometrium, curettage - BENIGN LOWER UTERINE SEGMENT TYPE POLYP(S). - THERE IS NO EVIDENCE OF HYPERPLASIA OR MALIGNANCY.  Patient is reporting some pinkish spotting.  She is not sexually active at this time.  Patient does not report any pain or heavy bleeding.  She had some malodorous discharge the first week after the procedure.  She does not report malodorous discharge currently.  Review of Systems  Constitutional: Negative.   Respiratory: Negative.   Cardiovascular: Negative.   Gastrointestinal: Negative.   Genitourinary: Negative.        Objective:   Physical Exam Vitals signs reviewed.  Constitutional:      General: She is not in acute distress.    Appearance: She is well-developed.  HENT:     Head: Normocephalic and atraumatic.  Eyes:     Conjunctiva/sclera: Conjunctivae normal.  Cardiovascular:     Rate and Rhythm: Normal rate.  Pulmonary:     Effort: Pulmonary effort is normal.  Abdominal:     General: There is no distension.     Palpations: Abdomen is soft.     Tenderness: There is no abdominal tenderness.  Genitourinary:    Comments: Vulva: Tanner V no lesion, Vagina: Pale pink normal rugae small amount of blood in the vault coming from cervix.  Atrophic. Cervix: The area where LEEP was performed is still healing and still friable.  No evidence of infection at this time. Skin:    General: Skin is warm and dry.  Neurological:     Mental Status: She is alert and oriented to person,  place, and time.    Assessment & Plan:  53 year old female status post LEEP and resection of endometrial polyps.  Bleeding/spotting continues longer than 2 weeks, patient is to return for another exam.  Right now it appears the LEEP area is still healing and can attribute for the spotting.  CIN 1 on biopsy of cervix; will need pap in one year.  Mammogram and colonoscopy suggested.  Her primary care doctor, Dr. Glade Lloyd, also encourages her to get the screening tests.  RTC in 2 weeks if spotting continues.  If resolves, RTC 1 year.

## 2018-10-04 ENCOUNTER — Other Ambulatory Visit: Payer: Self-pay | Admitting: Family Medicine

## 2018-10-10 ENCOUNTER — Ambulatory Visit (INDEPENDENT_AMBULATORY_CARE_PROVIDER_SITE_OTHER): Payer: Medicare HMO | Admitting: Family Medicine

## 2018-10-10 VITALS — BP 134/78 | HR 63 | Wt 235.0 lb

## 2018-10-10 DIAGNOSIS — R635 Abnormal weight gain: Secondary | ICD-10-CM

## 2018-10-10 DIAGNOSIS — Z6841 Body Mass Index (BMI) 40.0 and over, adult: Secondary | ICD-10-CM | POA: Diagnosis not present

## 2018-10-10 MED ORDER — PHENTERMINE HCL 15 MG PO CAPS: 15.0000 mg | ORAL_CAPSULE | ORAL | 0 refills | Status: DC

## 2018-10-10 NOTE — Progress Notes (Signed)
Pt in today for blood pressure and weight check. Patient denies trouble sleeping, palpitations or medication problems. Patients last weight was 236 lbs, todays weight is 235 lb. Pt wanted provider to know she has not been as active and she would like due to having a GYN procedure this month. She is hoping to increase her exercising this month. A refill for phentermine will be faxed to pt's pharmacy. Pt advised to schedule a follow up with nurse/provider in 30 days.Marland Kitchen

## 2018-10-10 NOTE — Progress Notes (Signed)
Agree with documentation as above.   Thang Flett, MD  

## 2018-10-11 ENCOUNTER — Other Ambulatory Visit: Payer: Self-pay | Admitting: Family Medicine

## 2018-11-07 ENCOUNTER — Ambulatory Visit: Payer: Medicare HMO

## 2018-11-09 ENCOUNTER — Other Ambulatory Visit: Payer: Self-pay | Admitting: Family Medicine

## 2018-11-09 DIAGNOSIS — J4550 Severe persistent asthma, uncomplicated: Secondary | ICD-10-CM

## 2018-11-10 ENCOUNTER — Encounter: Payer: Self-pay | Admitting: Family Medicine

## 2018-11-10 DIAGNOSIS — R635 Abnormal weight gain: Secondary | ICD-10-CM

## 2018-11-11 MED ORDER — PHENTERMINE HCL 15 MG PO CAPS: 15.0000 mg | ORAL_CAPSULE | ORAL | 0 refills | Status: DC

## 2018-11-17 ENCOUNTER — Other Ambulatory Visit: Payer: Self-pay | Admitting: Family Medicine

## 2018-11-17 DIAGNOSIS — M160 Bilateral primary osteoarthritis of hip: Secondary | ICD-10-CM

## 2018-12-05 ENCOUNTER — Ambulatory Visit: Payer: Medicare HMO

## 2018-12-13 ENCOUNTER — Ambulatory Visit: Payer: Medicare HMO

## 2019-01-16 ENCOUNTER — Other Ambulatory Visit: Payer: Self-pay | Admitting: Family Medicine

## 2019-02-06 ENCOUNTER — Other Ambulatory Visit: Payer: Self-pay | Admitting: Family Medicine

## 2019-02-06 DIAGNOSIS — J4540 Moderate persistent asthma, uncomplicated: Secondary | ICD-10-CM

## 2019-02-17 ENCOUNTER — Ambulatory Visit (INDEPENDENT_AMBULATORY_CARE_PROVIDER_SITE_OTHER): Payer: Medicare HMO | Admitting: Family Medicine

## 2019-02-17 ENCOUNTER — Other Ambulatory Visit: Payer: Self-pay | Admitting: Family Medicine

## 2019-02-17 ENCOUNTER — Encounter: Payer: Self-pay | Admitting: Family Medicine

## 2019-02-17 ENCOUNTER — Telehealth: Payer: Self-pay | Admitting: *Deleted

## 2019-02-17 VITALS — Wt 238.0 lb

## 2019-02-17 DIAGNOSIS — J4541 Moderate persistent asthma with (acute) exacerbation: Secondary | ICD-10-CM

## 2019-02-17 DIAGNOSIS — M19042 Primary osteoarthritis, left hand: Secondary | ICD-10-CM

## 2019-02-17 DIAGNOSIS — M79642 Pain in left hand: Secondary | ICD-10-CM | POA: Diagnosis not present

## 2019-02-17 DIAGNOSIS — R635 Abnormal weight gain: Secondary | ICD-10-CM | POA: Diagnosis not present

## 2019-02-17 DIAGNOSIS — M19041 Primary osteoarthritis, right hand: Secondary | ICD-10-CM

## 2019-02-17 DIAGNOSIS — Z6841 Body Mass Index (BMI) 40.0 and over, adult: Secondary | ICD-10-CM

## 2019-02-17 DIAGNOSIS — M79641 Pain in right hand: Secondary | ICD-10-CM | POA: Diagnosis not present

## 2019-02-17 DIAGNOSIS — M160 Bilateral primary osteoarthritis of hip: Secondary | ICD-10-CM

## 2019-02-17 DIAGNOSIS — Z6835 Body mass index (BMI) 35.0-35.9, adult: Secondary | ICD-10-CM

## 2019-02-17 MED ORDER — TRELEGY ELLIPTA 100-62.5-25 MCG/INH IN AEPB: 1.0000 | INHALATION_SPRAY | Freq: Every day | RESPIRATORY_TRACT | 3 refills | Status: DC

## 2019-02-17 MED ORDER — DICLOFENAC SODIUM 1 % TD GEL: 2.0000 g | Freq: Four times a day (QID) | TRANSDERMAL | 2 refills | Status: DC

## 2019-02-17 MED ORDER — PHENTERMINE HCL 15 MG PO CAPS: 15.0000 mg | ORAL_CAPSULE | ORAL | 0 refills | Status: DC

## 2019-02-17 NOTE — Progress Notes (Signed)
Virtual Visit via Video Note  I connected with Robin [name] on 02/17/19 at  2:20 PM EDT by a video enabled telemedicine application and verified that I am speaking with the correct person using two identifiers.   I discussed the limitations of evaluation and management by telemedicine and the availability of in person appointments. The patient expressed understanding and agreed to proceed.  Pt was at home and I was in my office for the virtual visit.     Subjective:    CC: discuss weight loss.   HPI: Last seen in Feb for nurse visit for weight check when on phentermine. She has had sugery to it has slowed her attempt at weight loss.  She would like to restart phentermine.  She is up 3 lbs since then.  She tolerated it well. No CP or SOB recently.  She is in the donut hole with her inusrance to will not be able to afford any of the newer weight loss medication.  She still just really struggling.  She feels like she is active and she gardens.  Hand Pain bilateral x 3 months. Says pain mostly between pointer and thumb and wrist. Says would easily drop things bc of pain.  Distally she thought maybe she had just overdone it of gardening and being in the yard but since the pain continues to be present.  Was making sauerkraut and felt her hands twist and cramp. No redness over the joint.  She says they occ look swollen.  Hands hurt worse and ache at night.  Sister with Type 1  Diabetes. No fam hx of RA, etc.  She says her dad had a lot of arthritis.    Hx Bilateral hip replacement.   Waking up with muscle cramping in the back of her thighs.  Started after hip surgery.    Past medical history, Surgical history, Family history not pertinant except as noted below, Social history, Allergies, and medications have been entered into the medical record, reviewed, and corrections made.   Review of Systems: No fevers, chills, night sweats, weight loss, chest pain, or shortness of breath.   Objective:     General: Speaking clearly in complete sentences without any shortness of breath.  Alert and oriented x3.  Normal judgment. No apparent acute distress. Well groomed.     Impression and Recommendations:   Abnormal weight gain/Desire for weight loss/Severe Obesity BMI 43-discussed options.  She would like to go back on the phentermine.  She did really well with it previously.  We will start with 15 mg and use that until she seems to plateau with the medication and then at that point we can bump her dose up if needed.  She wanted to follow-up in 1 month for a blood pressure and weight check.  She does not currently have a blood pressure cuff but if she is able to get her own then we can do a virtual visit.  If not then I would like for her to come in to the office to just 1 to make sure since she is over 50 that her blood pressure is not affected while taking the medication.  Just reminded her again that if she develops any chest pain shortness of breath or palpitations on the medication she needs to stop it immediately.  We also discussed setting nutrition and exercise goals.  Current weight: 238 lbs Previous weight: 235 lbs in Feb Change in weight: up 3 lbs.  Weight loss goal: ?  Nutrition  goal: Exercise goal: she gardens and is active Medication management: restart phentermine Follow up: 4 weeks.   Bilateral hand pain-suspect probably osteoarthritis especially if she is already had bilateral hip replacements at this age.  But we will do some additional work-up just to rule out any type of autoimmune type arthritis.  We will go ahead and place order for labs today.  We will also get plain film x-rays to evaluate for osteoarthritis versus rheumatoid.  Knee pain-likely related to arthritis as well.  And hopefully that weight loss will be helpful.    I discussed the assessment and treatment plan with the patient. The patient was provided an opportunity to ask questions and all were answered. The  patient agreed with the plan and demonstrated an understanding of the instructions.   The patient was advised to call back or seek an in-person evaluation if the symptoms worsen or if the condition fails to improve as anticipated.   Beatrice Lecher, MD

## 2019-02-17 NOTE — Telephone Encounter (Signed)
Spoke w/[name] and asked that the [facility] be cancelled.  She will place a note for this to be cancelled.Robin [name]

## 2019-02-17 NOTE — Progress Notes (Signed)
Pt would like to restart phentermine.Audelia Hives [place]

## 2019-02-24 ENCOUNTER — Other Ambulatory Visit: Payer: Self-pay

## 2019-02-24 ENCOUNTER — Ambulatory Visit (INDEPENDENT_AMBULATORY_CARE_PROVIDER_SITE_OTHER): Payer: Medicare HMO

## 2019-02-24 DIAGNOSIS — R635 Abnormal weight gain: Secondary | ICD-10-CM

## 2019-02-24 DIAGNOSIS — M79641 Pain in right hand: Secondary | ICD-10-CM | POA: Diagnosis not present

## 2019-02-24 DIAGNOSIS — Z6841 Body Mass Index (BMI) 40.0 and over, adult: Secondary | ICD-10-CM

## 2019-02-24 DIAGNOSIS — M79642 Pain in left hand: Secondary | ICD-10-CM

## 2019-02-24 DIAGNOSIS — G8929 Other chronic pain: Secondary | ICD-10-CM

## 2019-02-24 DIAGNOSIS — J4541 Moderate persistent asthma with (acute) exacerbation: Secondary | ICD-10-CM

## 2019-04-10 ENCOUNTER — Ambulatory Visit (INDEPENDENT_AMBULATORY_CARE_PROVIDER_SITE_OTHER): Payer: Medicare HMO | Admitting: Family Medicine

## 2019-04-10 ENCOUNTER — Encounter: Payer: Self-pay | Admitting: Family Medicine

## 2019-04-10 ENCOUNTER — Other Ambulatory Visit: Payer: Self-pay

## 2019-04-10 VITALS — BP 117/69 | HR 83 | Ht 62.0 in | Wt 243.0 lb

## 2019-04-10 DIAGNOSIS — J4541 Moderate persistent asthma with (acute) exacerbation: Secondary | ICD-10-CM

## 2019-04-10 DIAGNOSIS — Z6841 Body Mass Index (BMI) 40.0 and over, adult: Secondary | ICD-10-CM

## 2019-04-10 DIAGNOSIS — R635 Abnormal weight gain: Secondary | ICD-10-CM

## 2019-04-10 DIAGNOSIS — M79641 Pain in right hand: Secondary | ICD-10-CM

## 2019-04-10 DIAGNOSIS — M79642 Pain in left hand: Secondary | ICD-10-CM | POA: Diagnosis not present

## 2019-04-10 DIAGNOSIS — M5410 Radiculopathy, site unspecified: Secondary | ICD-10-CM | POA: Diagnosis not present

## 2019-04-10 DIAGNOSIS — J449 Chronic obstructive pulmonary disease, unspecified: Secondary | ICD-10-CM | POA: Diagnosis not present

## 2019-04-10 MED ORDER — CYCLOBENZAPRINE HCL 10 MG PO TABS: 10.0000 mg | ORAL_TABLET | Freq: Every evening | ORAL | 0 refills | Status: DC | PRN

## 2019-04-10 MED ORDER — PHENTERMINE HCL 37.5 MG PO CAPS: 37.5000 mg | ORAL_CAPSULE | ORAL | 1 refills | Status: DC

## 2019-04-10 MED ORDER — PREDNISONE 20 MG PO TABS: 40.0000 mg | ORAL_TABLET | Freq: Every day | ORAL | 0 refills | Status: DC

## 2019-04-10 NOTE — Progress Notes (Addendum)
Established Patient Office Visit  Subjective:  Patient ID: Robin [name], female    DOB: November 07, 1965  Age: 53 y.o. MRN: [id number]  CC:  Chief Complaint  Patient presents with  . Back Pain  . abnormal weight gain    HPI Robin [name] presents for back pain.  She had been doing a lot of gardening and working in the kitchen in about a week and half ago started to experience some left low back pain that is radiating down her left outer leg.  She says it goes down to about the knee and then seems to skip and then affect her ankle.  She denies any specific trauma or injury but has been doing a lot of hard work in general.  She does have a history of a lumbar laminectomy at L4-5 in 1994.  Has been mostly using Tylenol, Mobic, heat and ice.  It is a little better than it was a few days ago.  She has been trying to do some stretches at home.  She is really try to just rested over the last 4 days.  Abnormal weight gain-she has gained some weight since last time she was here.  She was on phentermine up until about June.  She did okay with it.  She really would like to consider restarting it and try to get some of the weight off.  She tolerates it well without any chest pain or shortness of breath.  Past Medical History:  Diagnosis Date  . Arthritis   . Asthma    Flonase,Singulair,Advair daily  . Convulsion ([place])    as a baby   . COPD (chronic obstructive pulmonary disease) (HCC)    uses Albuterol daily as needed  . Dysphagia   . GERD (gastroesophageal reflux disease)   . History of bronchitis    last time 56yrs ago  . History of kidney stones   . Hyperlipidemia    borderline no meds required  . Joint pain   . Joint swelling   . Shortness of breath    lying/sitting/exertion    Past Surgical History:  Procedure Laterality Date  . CHOLECYSTECTOMY  2002  . DILATATION & CURETTAGE/HYSTEROSCOPY WITH MYOSURE N/A 08/05/2018   Procedure: DILATATION & CURETTAGE/HYSTEROSCOPY WITH MYOSURE;   Surgeon: Guss Bunde, MD;  Location: [facility];  Service: Gynecology;  Laterality: N/A;  . ERCP    . LEEP N/A 08/05/2018   Procedure: LOOP ELECTROSURGICAL EXCISION PROCEDURE (LEEP);  Surgeon: Guss Bunde, MD;  Location: [facility];  Service: Gynecology;  Laterality: N/A;  . LUMBAR LAMINECTOMY  1994   L4-5  . TOTAL HIP ARTHROPLASTY Left 11/24/2013   Procedure: LEFT TOTAL HIP ARTHROPLASTY;  Surgeon: Kerin Salen, MD;  Location: [facility];  Service: Orthopedics;  Laterality: Left;  . TOTAL HIP ARTHROPLASTY Right 02/05/2014   Procedure: RIGHT TOTAL HIP ARTHROPLASTY;  Surgeon: Kerin Salen, MD;  Location: [facility];  Service: Orthopedics;  Laterality: Right;    Family History  Problem Relation Age of Onset  . Alcohol abuse Brother   . Alcohol abuse Sister   . Diabetes Sister   . Heart attack Father   . Diabetes Other        grandparents.   . Breast cancer Maternal Aunt        post menopausal    Social History   Socioeconomic History  . Marital status: Widowed    Spouse name: Not on file  . Number of children: 1  . Years of  education: 4212  . Highest education level: 12th grade  Occupational History  . Occupation: Programmer, applications[employer]  Social Needs  . Financial resource strain: Not hard at all  . Food insecurity    Worry: Never true    Inability: Never true  . Transportation needs    Medical: No    Non-medical: No  Tobacco Use  . Smoking status: Former Smoker    Packs/day: 1.00    Years: 25.00    Pack years: 25.00    Types: Cigarettes    Quit date: 01/30/2004    Years since quitting: 15.2  . Smokeless tobacco: Never Used  . Tobacco comment: quit 8-2659yrs ago  Substance and Sexual Activity  . Alcohol use: No  . Drug use: No  . Sexual activity: Not Currently    Birth control/protection: None  Lifestyle  . Physical activity    Days per week: 4 days    Minutes per session: 40 min  . Stress: Not at all  Relationships  . Social connections    Talks on phone: More than three times a  week    Gets together: More than three times a week    Attends religious service: More than 4 times per year    Active member of club or organization: No    Attends meetings of clubs or organizations: Never    Relationship status: Widowed  . Intimate partner violence    Fear of current or ex partner: No    Emotionally abused: No    Physically abused: No    Forced sexual activity: No  Other Topics Concern  . Not on file  Social History Narrative   Patient is currently trying to do intentional walking 40 minutes 3-4 times a day. Patient also does a lot of gardening.    Outpatient Medications Prior to Visit  Medication Sig Dispense Refill  . acetaminophen (TYLENOL) 650 MG CR tablet Take 650 mg by mouth every 8 (eight) hours as needed for pain.    Marland Kitchen. albuterol (VENTOLIN HFA) 108 (90 Base) MCG/ACT inhaler INHALE 2 PUFFS BY MOUTH EVERY 4 HOURS AS NEEDED FOR WHEEZING OR  SHORTNESS  OF  BREATH 18 g 0  . diclofenac sodium (VOLTAREN) 1 % GEL Apply 2 g topically 4 (four) times daily. OK to sub OTC if cheaper 200 g 2  . Fluticasone-Umeclidin-Vilant (TRELEGY ELLIPTA) 100-62.5-25 MCG/INH AEPB Inhale 1 puff into the lungs daily. 60 each 3  . levocetirizine (XYZAL) 5 MG tablet TAKE 1 TABLET BY MOUTH ONCE DAILY IN THE EVENING 90 tablet 3  . meloxicam (MOBIC) 15 MG tablet TAKE 1 TABLET BY MOUTH ONCE DAILY AS NEEDED FOR PAIN 90 tablet 0  . montelukast (SINGULAIR) 10 MG tablet TAKE 1 TABLET BY MOUTH AT BEDTIME 90 tablet 0  . omeprazole (PRILOSEC OTC) 20 MG tablet Take 20 mg by mouth daily.    Marland Kitchen. OVER THE COUNTER MEDICATION Apply 1 application topically at bedtime. "Two old goats cream" for arthritis pain    . phentermine 15 MG capsule Take 1 capsule (15 mg total) by mouth every morning. (Patient not taking: Reported on 04/10/2019) 30 capsule 0   No facility-administered medications prior to visit.     Allergies  Allergen Reactions  . Fluticasone Other (See Comments)    Headache (tolerates Advair with no  reaction)     ROS Review of Systems    Objective:    Physical Exam  Constitutional: She is oriented to person, place, and time. She appears  well-developed and well-nourished.  HENT:  Head: Normocephalic and atraumatic.  Eyes: Conjunctivae and EOM are normal.  Cardiovascular: Normal rate, regular rhythm and normal heart sounds.  Pulmonary/Chest: Effort normal and breath sounds normal.  Musculoskeletal:     Comments: Tender over the upper and lower lumbar spine and to the left of the spine.  Non tender over the SI joint.  Normal lumbar flexion and extension.  Normal rotation right and left as well as side bending right and left.  Though she did tilt sideways when coming back up to avoid pain in her back.  Positive straight leg raise on the left.  Hip, knee, ankle strength is 5-5 bilaterally.  Patellar reflexes 2+ bilaterally.  Neurological: She is alert and oriented to person, place, and time.  Skin: Skin is warm and dry. No pallor.  Psychiatric: She has a normal mood and affect. Her behavior is normal.  Vitals reviewed.   BP 117/69   Pulse 83   Ht 5\' 2"  (1.575 m)   Wt 243 lb (110.2 kg)   LMP 05/24/2018   SpO2 96%   BMI 44.45 kg/m  Wt Readings from Last 3 Encounters:  04/10/19 243 lb (110.2 kg)  02/17/19 238 lb (108 kg)  10/10/18 235 lb (106.6 kg)     There are no preventive care reminders to display for this patient.  There are no preventive care reminders to display for this patient.  No results found for: TSH Lab Results  Component Value Date   WBC 7.8 08/02/2018   HGB 14.7 08/02/2018   HCT 48.6 (H) 08/02/2018   MCV 88.7 08/02/2018   PLT 345 08/02/2018   Lab Results  Component Value Date   NA 140 08/02/2018   K 4.2 08/02/2018   CO2 24 08/02/2018   GLUCOSE 102 (H) 08/02/2018   BUN 14 08/02/2018   CREATININE 0.82 08/02/2018   BILITOT 0.3 11/04/2017   ALKPHOS 81 10/22/2015   AST 15 11/04/2017   ALT 15 11/04/2017   PROT 6.3 11/04/2017   ALBUMIN 4.0  10/22/2015   CALCIUM 9.4 08/02/2018   ANIONGAP 8 08/02/2018   Lab Results  Component Value Date   CHOL 193 11/04/2017   Lab Results  Component Value Date   HDL 49 (L) 11/04/2017   Lab Results  Component Value Date   LDLCALC 119 (H) 11/04/2017   Lab Results  Component Value Date   TRIG 132 11/04/2017   Lab Results  Component Value Date   CHOLHDL 3.9 11/04/2017   No results found for: HGBA1C    Assessment & Plan:   Problem List Items Addressed This Visit      Respiratory   Asthma   Relevant Medications   predniSONE (DELTASONE) 20 MG tablet   phentermine 37.5 MG capsule     Other   Severe obesity (BMI >= 40) (HCC)   Relevant Medications   phentermine 37.5 MG capsule    Other Visit Diagnoses    Radicular low back pain    -  Primary   Abnormal weight gain       Relevant Medications   phentermine 37.5 MG capsule   Pain in both hands       Relevant Medications   phentermine 37.5 MG capsule   BMI 40.0-44.9, adult (HCC)       Relevant Medications   phentermine 37.5 MG capsule      Left-sided low back pain, with radiculopathy-most consistent with sciatica.  Will treat with oral prednisone for 5  days.  Reminded her not to take an NSAID during that time.  Okay to use Tylenol with it.  Recommended more formal physical therapy but she preferred to do some home PT at least initially.  So I did go ahead and give her handout to do on her own at home.  Severe obesity/BMI 44-discussed getting back on phentermine today.  We will try increasing the dose to 37.5 mg and have her follow-up in 4 weeks.  She will really need to focus in on diet since she probably will not be able to exercise regularly because of the back pain.  Meds ordered this encounter  Medications  . predniSONE (DELTASONE) 20 MG tablet    Sig: Take 2 tablets (40 mg total) by mouth daily with breakfast.    Dispense:  10 tablet    Refill:  0  . cyclobenzaprine (FLEXERIL) 10 MG tablet    Sig: Take 1 tablet  (10 mg total) by mouth at bedtime as needed for muscle spasms.    Dispense:  20 tablet    Refill:  0  . phentermine 37.5 MG capsule    Sig: Take 1 capsule (37.5 mg total) by mouth every morning.    Dispense:  30 capsule    Refill:  1    Follow-up: Return in about 4 weeks (around 05/08/2019) for New start medication.    Nani Gasseratherine Thailan Sava, MD

## 2019-04-11 DIAGNOSIS — H524 Presbyopia: Secondary | ICD-10-CM | POA: Diagnosis not present

## 2019-04-11 DIAGNOSIS — H52209 Unspecified astigmatism, unspecified eye: Secondary | ICD-10-CM | POA: Diagnosis not present

## 2019-04-11 DIAGNOSIS — H5213 Myopia, bilateral: Secondary | ICD-10-CM | POA: Diagnosis not present

## 2019-04-18 ENCOUNTER — Other Ambulatory Visit: Payer: Self-pay | Admitting: Family Medicine

## 2019-05-08 ENCOUNTER — Ambulatory Visit: Payer: Medicare HMO | Admitting: Family Medicine

## 2019-05-11 ENCOUNTER — Other Ambulatory Visit: Payer: Self-pay | Admitting: Family Medicine

## 2019-05-22 ENCOUNTER — Other Ambulatory Visit: Payer: Self-pay | Admitting: Family Medicine

## 2019-05-22 DIAGNOSIS — M160 Bilateral primary osteoarthritis of hip: Secondary | ICD-10-CM

## 2019-05-22 NOTE — Progress Notes (Signed)
Subjective:   Robin [name] is a 53 y.o. female who presents for Medicare Annual (Subsequent) preventive examination.  Review of Systems:  No ROS.  Medicare Wellness Virtual Visit.  Visual/audio telehealth visit, UTA vital signs.   See social history for additional risk factors.    Cardiac Risk Factors include: advanced age (>27men, >44 women);family history of premature cardiovascular disease Sleep patterns: Getting  4-6 hours of sleep a night. Does not wake up to void during the night.  Wakes up and feels refreshed and ready for the day. Home Safety/Smoke Alarms: Feels safe in home. Smoke alarms in place.  Living environment; Lives sister and grandaughter in a 1 story home. No stairs in the home. Shower is a step over tub and has grab bars in place. Seat Belt Safety/Bike Helmet: Wears seat belt.   Female:   Pap- UTD      Mammo- declined      Dexa scan- not eligible due to age       CCS- declined     Objective:     Vitals: LMP 05/24/2018   There is no height or weight on file to calculate BMI.  Advanced Directives 05/23/2019 08/02/2018 05/17/2018 02/05/2014 01/29/2014 01/16/2014 11/26/2013  Does Patient Have a Medical Advance Directive? No No No Patient does not have advance directive;Patient would not like information Patient does not have advance directive;Patient would not like information Patient does not have advance directive;Patient would not like information Patient does not have advance directive  Would patient like information on creating a medical advance directive? No - Patient declined No - Patient declined Yes (MAU/Ambulatory/Procedural Areas - Information given) - - - -  Pre-existing out of facility DNR order (yellow form or pink MOST form) - - - No - - No    Tobacco Social History   Tobacco Use  Smoking Status Former Smoker  . Packs/day: 1.00  . Years: 25.00  . Pack years: 25.00  . Types: Cigarettes  . Quit date: 01/30/2004  . Years since quitting: 15.3   Smokeless Tobacco Never Used  Tobacco Comment   quit 8-53yrs ago     Counseling given: Not Answered Comment: quit 8-47yrs ago   Clinical Intake:  Pre-visit preparation completed: Yes  Pain : No/denies pain     Nutritional Risks: None Diabetes: No  How often do you need to have someone help you when you read instructions, pamphlets, or other written materials from your doctor or pharmacy?: 1 - Never What is the last grade level you completed in school?: 12  Interpreter Needed?: No  Information entered by :: Carolin Sicks, LPN  Past Medical History:  Diagnosis Date  . Arthritis   . Asthma    Flonase,Singulair,Advair daily  . Convulsion (HCC)    as a baby   . COPD (chronic obstructive pulmonary disease) (HCC)    uses Albuterol daily as needed  . Dysphagia   . GERD (gastroesophageal reflux disease)   . History of bronchitis    last time 36yrs ago  . History of kidney stones   . Hyperlipidemia    borderline no meds required  . Joint pain   . Joint swelling   . Shortness of breath    lying/sitting/exertion   Past Surgical History:  Procedure Laterality Date  . CHOLECYSTECTOMY  2002  . DILATATION & CURETTAGE/HYSTEROSCOPY WITH MYOSURE N/A 08/05/2018   Procedure: DILATATION & CURETTAGE/HYSTEROSCOPY WITH MYOSURE;  Surgeon: Lesly Dukes, MD;  Location: [facility];  Service: Gynecology;  Laterality:  N/A;  . ERCP    . LEEP N/A 08/05/2018   Procedure: LOOP ELECTROSURGICAL EXCISION PROCEDURE (LEEP);  Surgeon: Lesly Dukes, MD;  Location: [facility];  Service: Gynecology;  Laterality: N/A;  . LUMBAR LAMINECTOMY  1994   L4-5  . TOTAL HIP ARTHROPLASTY Left 11/24/2013   Procedure: LEFT TOTAL HIP ARTHROPLASTY;  Surgeon: Nestor Lewandowsky, MD;  Location: [facility];  Service: Orthopedics;  Laterality: Left;  . TOTAL HIP ARTHROPLASTY Right 02/05/2014   Procedure: RIGHT TOTAL HIP ARTHROPLASTY;  Surgeon: Nestor Lewandowsky, MD;  Location: [facility];  Service: Orthopedics;  Laterality: Right;   Family  History  Problem Relation Age of Onset  . Alcohol abuse Brother   . Alcohol abuse Sister   . Diabetes Sister   . Heart attack Father   . Diabetes Other        grandparents.   . Breast cancer Maternal Aunt        post menopausal   Social History   Socioeconomic History  . Marital status: Widowed    Spouse name: Not on file  . Number of children: 1  . Years of education: 57  . Highest education level: 12th grade  Occupational History  . Occupation: Programmer, applications  Social Needs  . Financial resource strain: Not hard at all  . Food insecurity    Worry: Never true    Inability: Never true  . Transportation needs    Medical: No    Non-medical: No  Tobacco Use  . Smoking status: Former Smoker    Packs/day: 1.00    Years: 25.00    Pack years: 25.00    Types: Cigarettes    Quit date: 01/30/2004    Years since quitting: 15.3  . Smokeless tobacco: Never Used  . Tobacco comment: quit 8-55yrs ago  Substance and Sexual Activity  . Alcohol use: No  . Drug use: No  . Sexual activity: Not Currently    Birth control/protection: None  Lifestyle  . Physical activity    Days per week: 7 days    Minutes per session: Not on file  . Stress: Not at all  Relationships  . Social connections    Talks on phone: More than three times a week    Gets together: Twice a week    Attends religious service: More than 4 times per year    Active member of club or organization: No    Attends meetings of clubs or organizations: Never    Relationship status: Widowed  Other Topics Concern  . Not on file  Social History Narrative   Patient is currently trying to do intentional walking 40 minutes 3-4 times a day. Patient also does a lot of gardening.    Outpatient Encounter Medications as of 05/23/2019  Medication Sig  . acetaminophen (TYLENOL) 650 MG CR tablet Take 650 mg by mouth every 8 (eight) hours as needed for pain.  Marland Kitchen albuterol (VENTOLIN HFA) 108 (90 Base) MCG/ACT inhaler INHALE 2 PUFFS BY  MOUTH EVERY 4 HOURS AS NEEDED FOR WHEEZING OR  SHORTNESS  OF  BREATH  . cyclobenzaprine (FLEXERIL) 10 MG tablet Take 1 tablet (10 mg total) by mouth at bedtime as needed for muscle spasms.  . diclofenac sodium (VOLTAREN) 1 % GEL APPLY 2 GRAMS TOPICALLY FOUR TIMES DAILY  . Fluticasone-Umeclidin-Vilant (TRELEGY ELLIPTA) 100-62.5-25 MCG/INH AEPB Inhale 1 puff into the lungs daily.  Marland Kitchen levocetirizine (XYZAL) 5 MG tablet TAKE 1 TABLET BY MOUTH ONCE DAILY IN THE EVENING  .  meloxicam (MOBIC) 15 MG tablet Take 1 tablet (15 mg total) by mouth daily. As needed for pain. Due for appt  . montelukast (SINGULAIR) 10 MG tablet TAKE 1 TABLET BY MOUTH AT BEDTIME  . omeprazole (PRILOSEC OTC) 20 MG tablet Take 20 mg by mouth daily.  Marland Kitchen OVER THE COUNTER MEDICATION Apply 1 application topically at bedtime. "Two old goats cream" for arthritis pain  . phentermine 37.5 MG capsule Take 1 capsule (37.5 mg total) by mouth every morning. (Patient not taking: Reported on 05/23/2019)  . [DISCONTINUED] meloxicam (MOBIC) 15 MG tablet TAKE 1 TABLET BY MOUTH ONCE DAILY AS NEEDED FOR PAIN  . [DISCONTINUED] predniSONE (DELTASONE) 20 MG tablet Take 2 tablets (40 mg total) by mouth daily with breakfast.   No facility-administered encounter medications on file as of 05/23/2019.     Activities of Daily Living In your present state of health, do you have any difficulty performing the following activities: 05/23/2019 08/02/2018  Hearing? Y N  Comment difficulty hearing in both ears -  Vision? N N  Difficulty concentrating or making decisions? N N  Walking or climbing stairs? N Y  Dressing or bathing? N N  Doing errands, shopping? N -  Preparing Food and eating ? N -  Using the Toilet? N -  In the past six months, have you accidently leaked urine? N -  Do you have problems with loss of bowel control? N -  Managing your Medications? N -  Managing your Finances? N -  Housekeeping or managing your Housekeeping? N -  Some recent data  might be hidden    Patient Care Team: Hali Marry, MD as PCP - General (Family Medicine)    Assessment:   This is a routine wellness examination for [place].Physical assessment deferred to PCP.   Exercise Activities and Dietary recommendations Current Exercise Habits: Home exercise routine, Type of exercise: walking, Time (Minutes): 40, Frequency (Times/Week): 7, Weekly Exercise (Minutes/Week): 280, Intensity: Mild Diet Eats healthy of diet of vegetables and fruits and proteins. Breakfast: skips Lunch: leftovers Dinner: Meat and vegetables. Drinks water daily nut not enough maybe 22 ounces a day.      Goals    . DIET - INCREASE WATER INTAKE     Increase water intake. Drink at least 60 oz. Of water a day    . Exercise 150 min/wk Moderate Activity     Continue to walk daily and take albuterol inhaler with you in case you need it.    . Weight (lb) < 200 lb (90.7 kg)     Wants to loose 100lbs in the next year.       Fall Risk Fall Risk  05/23/2019 04/10/2019 05/17/2018  Falls in the past year? 1 1 Yes  Comment - - fell in garden. Seen doctor had a sprain ankle  Number falls in past yr: 0 0 1  Injury with Fall? 0 0 Yes  Comment - - ankle sprain  Risk for fall due to : History of fall(s) Other (Comment) -  Follow up Falls prevention discussed Falls prevention discussed Falls prevention discussed   Is the patient's home free of loose throw rugs in walkways, pet beds, electrical cords, etc?   yes      Grab bars in the bathroom? yes      Handrails on the stairs?   no      Adequate lighting?   yes   Depression Screen PHQ 2/9 Scores 05/23/2019 04/10/2019 04/10/2019 05/17/2018  PHQ -  2 Score 1 0 0 0  PHQ- 9 Score 8 3 - -     Cognitive Function     6CIT Screen 05/23/2019 05/17/2018  What Year? 0 points 0 points  What month? 0 points 0 points  What time? 0 points 0 points  Count back from 20 0 points 0 points  Months in reverse 0 points 0 points  Repeat phrase 0  points 2 points  Total Score 0 2    Immunization History  Administered Date(s) Administered  . Influenza,inj,Quad PF,6+ Mos 08/28/2014, 04/18/2015, 04/22/2016, 07/14/2017, 05/17/2018  . Pneumococcal Polysaccharide-23 11/25/2013  . Tdap 11/06/2015    Screening Tests Health Maintenance  Topic Date Due  . MAMMOGRAM  04/29/2019  . INFLUENZA VACCINE  07/24/2019 (Originally 03/25/2019)  . COLONOSCOPY  07/24/2019 (Originally 03/16/2014)  . PAP SMEAR-Modifier  06/13/2021  . TETANUS/TDAP  11/05/2025  . Hepatitis C Screening  Completed  . HIV Screening  Completed        Plan:      Ms. Excell SeltzerBaker , Thank you for taking time to come for your Medicare Wellness Visit. I appreciate your ongoing commitment to your health goals. Please review the following plan we discussed and let me know if I can assist you in the future.  Please schedule your next medicare wellness visit with me in 1 yr. Continue doing brain stimulating activities (puzzles, reading, adult coloring books, staying active) to keep memory sharp.    These are the goals we discussed: Goals    . DIET - INCREASE WATER INTAKE     Increase water intake. Drink at least 60 oz. Of water a day    . Exercise 150 min/wk Moderate Activity     Continue to walk daily and take albuterol inhaler with you in case you need it.    . Weight (lb) < 200 lb (90.7 kg)     Wants to loose 100lbs in the next year.       This is a list of the screening recommended for you and due dates:  Health Maintenance  Topic Date Due  . Mammogram  04/29/2019  . Flu Shot  07/24/2019*  . Colon Cancer Screening  07/24/2019*  . Pap Smear  06/13/2021  . Tetanus Vaccine  11/05/2025  .  Hepatitis C: One time screening is recommended by Center for Disease Control  (CDC) for  adults born from [birth year] through 1965.   Completed  . HIV Screening  Completed  *Topic was postponed. The date shown is not the original due date.      I have personally reviewed and noted the  following in the patient's chart:   . Medical and social history . Use of alcohol, tobacco or illicit drugs  . Current medications and supplements . Functional ability and status . Nutritional status . Physical activity . Advanced directives . List of other physicians . Hospitalizations, surgeries, and ER visits in previous 12 months . Vitals . Screenings to include cognitive, depression, and falls . Referrals and appointments  In addition, I have reviewed and discussed with patient certain preventive protocols, quality metrics, and best practice recommendations. A written personalized care plan for preventive services as well as general preventive health recommendations were provided to patient.     Normand SloopKimberly A , LPN  1/61/09609/29/2020

## 2019-05-23 ENCOUNTER — Other Ambulatory Visit: Payer: Self-pay

## 2019-05-23 ENCOUNTER — Ambulatory Visit (INDEPENDENT_AMBULATORY_CARE_PROVIDER_SITE_OTHER): Payer: Medicare HMO | Admitting: *Deleted

## 2019-05-23 VITALS — BP 103/83 | HR 88 | Temp 98.7°F | Ht 62.0 in | Wt 242.0 lb

## 2019-05-23 DIAGNOSIS — Z23 Encounter for immunization: Secondary | ICD-10-CM | POA: Diagnosis not present

## 2019-05-23 DIAGNOSIS — Z0000 Encounter for general adult medical examination without abnormal findings: Secondary | ICD-10-CM

## 2019-05-23 NOTE — Patient Instructions (Signed)
Robin [name] , Thank you for taking time to come for your Medicare Wellness Visit. I appreciate your ongoing commitment to your health goals. Please review the following plan we discussed and let me know if I can assist you in the future.  Please schedule your next medicare wellness visit with me in 1 yr. Continue doing brain stimulating activities (puzzles, reading, adult coloring books, staying active) to keep memory sharp. These are the goals we discussed: Goals    . DIET - INCREASE WATER INTAKE     Increase water intake. Drink at least 60 oz. Of water a day    . Exercise 150 min/wk Moderate Activity     Continue to walk daily and take albuterol inhaler with you in case you need it.    . Weight (lb) < 200 lb (90.7 kg)     Wants to loose 100lbs in the next year.

## 2019-05-26 ENCOUNTER — Telehealth: Payer: Self-pay | Admitting: Family Medicine

## 2019-05-26 NOTE — Telephone Encounter (Signed)
Dr. Jerilynn Mages- I just done a medicare wellness visit with her and I addressed the mammogram and she declined it so I put under health maintenance that she declined. KG LPN

## 2019-05-26 NOTE — Telephone Encounter (Signed)
Please call patient and see if she is interested in getting a mammogram.  Is been 2 years since her last one.  I think she had previously declined but I just wanted to make sure that she did not want to go ahead and get that done this year.

## 2019-06-13 ENCOUNTER — Other Ambulatory Visit: Payer: Self-pay | Admitting: Family Medicine

## 2019-06-13 DIAGNOSIS — R635 Abnormal weight gain: Secondary | ICD-10-CM

## 2019-06-13 DIAGNOSIS — M79641 Pain in right hand: Secondary | ICD-10-CM

## 2019-06-13 DIAGNOSIS — Z6841 Body Mass Index (BMI) 40.0 and over, adult: Secondary | ICD-10-CM

## 2019-06-13 DIAGNOSIS — J4541 Moderate persistent asthma with (acute) exacerbation: Secondary | ICD-10-CM

## 2019-06-13 DIAGNOSIS — M79642 Pain in left hand: Secondary | ICD-10-CM

## 2019-06-13 DIAGNOSIS — Z6835 Body mass index (BMI) 35.0-35.9, adult: Secondary | ICD-10-CM

## 2019-06-21 ENCOUNTER — Other Ambulatory Visit: Payer: Self-pay | Admitting: Family Medicine

## 2019-06-21 DIAGNOSIS — M160 Bilateral primary osteoarthritis of hip: Secondary | ICD-10-CM

## 2019-07-06 ENCOUNTER — Other Ambulatory Visit: Payer: Self-pay | Admitting: Family Medicine

## 2019-07-18 ENCOUNTER — Other Ambulatory Visit: Payer: Self-pay | Admitting: Family Medicine

## 2019-07-19 DIAGNOSIS — Z20828 Contact with and (suspected) exposure to other viral communicable diseases: Secondary | ICD-10-CM | POA: Diagnosis not present

## 2019-09-16 ENCOUNTER — Other Ambulatory Visit: Payer: Self-pay | Admitting: Family Medicine

## 2019-09-16 DIAGNOSIS — J4540 Moderate persistent asthma, uncomplicated: Secondary | ICD-10-CM

## 2019-09-21 ENCOUNTER — Other Ambulatory Visit: Payer: Self-pay | Admitting: Family Medicine

## 2019-09-21 DIAGNOSIS — M160 Bilateral primary osteoarthritis of hip: Secondary | ICD-10-CM

## 2019-10-11 ENCOUNTER — Other Ambulatory Visit: Payer: Self-pay | Admitting: Family Medicine

## 2019-10-11 DIAGNOSIS — M79642 Pain in left hand: Secondary | ICD-10-CM

## 2019-10-11 DIAGNOSIS — R635 Abnormal weight gain: Secondary | ICD-10-CM

## 2019-10-11 DIAGNOSIS — Z6835 Body mass index (BMI) 35.0-35.9, adult: Secondary | ICD-10-CM

## 2019-10-11 DIAGNOSIS — M79641 Pain in right hand: Secondary | ICD-10-CM

## 2019-10-11 DIAGNOSIS — Z6841 Body Mass Index (BMI) 40.0 and over, adult: Secondary | ICD-10-CM

## 2019-10-11 DIAGNOSIS — J4541 Moderate persistent asthma with (acute) exacerbation: Secondary | ICD-10-CM

## 2019-11-15 ENCOUNTER — Other Ambulatory Visit: Payer: Self-pay | Admitting: Family Medicine

## 2019-11-21 ENCOUNTER — Other Ambulatory Visit: Payer: Self-pay | Admitting: Family Medicine

## 2019-11-24 ENCOUNTER — Other Ambulatory Visit: Payer: Self-pay | Admitting: Family Medicine

## 2019-12-24 ENCOUNTER — Other Ambulatory Visit: Payer: Self-pay | Admitting: Family Medicine

## 2019-12-24 DIAGNOSIS — M160 Bilateral primary osteoarthritis of hip: Secondary | ICD-10-CM

## 2019-12-29 ENCOUNTER — Telehealth: Payer: Self-pay | Admitting: Family Medicine

## 2019-12-29 NOTE — Telephone Encounter (Signed)
LVM advising pt of recommendations and asked that she call back to let us know how she would like to proceed.

## 2019-12-29 NOTE — Telephone Encounter (Signed)
Please call pt and remind her due for colon cancer screening. Would he like to do the stool cards, full scope or Cologuard? If no, then please mark decline.

## 2020-01-05 NOTE — Telephone Encounter (Signed)
lvm asking that she rtn call about colon cancer screening.

## 2020-01-05 NOTE — Telephone Encounter (Signed)
Letter sent to pt

## 2020-02-20 ENCOUNTER — Other Ambulatory Visit: Payer: Self-pay | Admitting: Family Medicine

## 2020-03-30 ENCOUNTER — Other Ambulatory Visit: Payer: Self-pay | Admitting: Family Medicine

## 2020-03-30 DIAGNOSIS — M160 Bilateral primary osteoarthritis of hip: Secondary | ICD-10-CM

## 2020-04-25 ENCOUNTER — Other Ambulatory Visit: Payer: Self-pay | Admitting: Family Medicine

## 2020-05-09 ENCOUNTER — Other Ambulatory Visit: Payer: Self-pay | Admitting: Family Medicine

## 2020-05-09 DIAGNOSIS — J4541 Moderate persistent asthma with (acute) exacerbation: Secondary | ICD-10-CM

## 2020-05-09 DIAGNOSIS — R635 Abnormal weight gain: Secondary | ICD-10-CM

## 2020-05-09 DIAGNOSIS — Z6841 Body Mass Index (BMI) 40.0 and over, adult: Secondary | ICD-10-CM

## 2020-05-09 DIAGNOSIS — M79642 Pain in left hand: Secondary | ICD-10-CM

## 2020-05-09 DIAGNOSIS — Z6835 Body mass index (BMI) 35.0-35.9, adult: Secondary | ICD-10-CM

## 2020-05-27 ENCOUNTER — Ambulatory Visit: Payer: Medicare HMO

## 2020-05-27 ENCOUNTER — Other Ambulatory Visit: Payer: Self-pay | Admitting: Family Medicine

## 2020-06-07 ENCOUNTER — Other Ambulatory Visit: Payer: Self-pay | Admitting: Family Medicine

## 2020-06-07 DIAGNOSIS — J4541 Moderate persistent asthma with (acute) exacerbation: Secondary | ICD-10-CM

## 2020-06-07 DIAGNOSIS — M79642 Pain in left hand: Secondary | ICD-10-CM

## 2020-06-07 DIAGNOSIS — M79641 Pain in right hand: Secondary | ICD-10-CM

## 2020-06-07 DIAGNOSIS — R635 Abnormal weight gain: Secondary | ICD-10-CM

## 2020-06-07 DIAGNOSIS — Z6835 Body mass index (BMI) 35.0-35.9, adult: Secondary | ICD-10-CM

## 2020-06-07 DIAGNOSIS — Z6841 Body Mass Index (BMI) 40.0 and over, adult: Secondary | ICD-10-CM

## 2020-06-07 NOTE — Telephone Encounter (Signed)
Called to pharmacy 

## 2020-07-02 ENCOUNTER — Other Ambulatory Visit: Payer: Self-pay | Admitting: Family Medicine

## 2020-07-02 DIAGNOSIS — M160 Bilateral primary osteoarthritis of hip: Secondary | ICD-10-CM

## 2020-07-08 ENCOUNTER — Other Ambulatory Visit: Payer: Self-pay | Admitting: Family Medicine

## 2020-07-08 DIAGNOSIS — R635 Abnormal weight gain: Secondary | ICD-10-CM

## 2020-07-08 DIAGNOSIS — Z6835 Body mass index (BMI) 35.0-35.9, adult: Secondary | ICD-10-CM

## 2020-07-08 DIAGNOSIS — Z6841 Body Mass Index (BMI) 40.0 and over, adult: Secondary | ICD-10-CM

## 2020-07-08 DIAGNOSIS — M160 Bilateral primary osteoarthritis of hip: Secondary | ICD-10-CM

## 2020-07-08 DIAGNOSIS — M79641 Pain in right hand: Secondary | ICD-10-CM

## 2020-07-08 DIAGNOSIS — J4541 Moderate persistent asthma with (acute) exacerbation: Secondary | ICD-10-CM

## 2020-07-26 ENCOUNTER — Telehealth: Payer: Self-pay | Admitting: Family Medicine

## 2020-07-26 NOTE — Telephone Encounter (Signed)
Please see MyChart note sent to patient.   

## 2020-08-05 ENCOUNTER — Other Ambulatory Visit: Payer: Self-pay | Admitting: Family Medicine

## 2020-08-05 DIAGNOSIS — Z6841 Body Mass Index (BMI) 40.0 and over, adult: Secondary | ICD-10-CM

## 2020-08-05 DIAGNOSIS — J4541 Moderate persistent asthma with (acute) exacerbation: Secondary | ICD-10-CM

## 2020-08-05 DIAGNOSIS — Z6835 Body mass index (BMI) 35.0-35.9, adult: Secondary | ICD-10-CM

## 2020-08-05 DIAGNOSIS — M79641 Pain in right hand: Secondary | ICD-10-CM

## 2020-08-05 DIAGNOSIS — R635 Abnormal weight gain: Secondary | ICD-10-CM

## 2020-08-23 ENCOUNTER — Other Ambulatory Visit: Payer: Self-pay | Admitting: Family Medicine

## 2020-08-24 DIAGNOSIS — N840 Polyp of corpus uteri: Secondary | ICD-10-CM

## 2020-08-24 HISTORY — DX: Polyp of corpus uteri: N84.0

## 2020-08-26 NOTE — Telephone Encounter (Signed)
Scheduled patient for 08/29/2020 for f/u appt. AM

## 2020-08-26 NOTE — Telephone Encounter (Signed)
Please call and let pt know that she is due for a f/u on her Asthma

## 2020-08-29 ENCOUNTER — Encounter: Payer: Self-pay | Admitting: Family Medicine

## 2020-08-29 ENCOUNTER — Other Ambulatory Visit: Payer: Self-pay

## 2020-08-29 ENCOUNTER — Ambulatory Visit (INDEPENDENT_AMBULATORY_CARE_PROVIDER_SITE_OTHER): Payer: Medicare HMO | Admitting: Family Medicine

## 2020-08-29 VITALS — BP 136/83 | HR 84 | Ht 62.0 in | Wt 274.0 lb

## 2020-08-29 DIAGNOSIS — M25562 Pain in left knee: Secondary | ICD-10-CM

## 2020-08-29 DIAGNOSIS — M7989 Other specified soft tissue disorders: Secondary | ICD-10-CM | POA: Diagnosis not present

## 2020-08-29 DIAGNOSIS — J4550 Severe persistent asthma, uncomplicated: Secondary | ICD-10-CM

## 2020-08-29 DIAGNOSIS — G8929 Other chronic pain: Secondary | ICD-10-CM

## 2020-08-29 MED ORDER — PREDNISONE 20 MG PO TABS: ORAL_TABLET | ORAL | 0 refills | Status: AC

## 2020-08-29 MED ORDER — CYCLOBENZAPRINE HCL 10 MG PO TABS
10.0000 mg | ORAL_TABLET | Freq: Every evening | ORAL | 1 refills | Status: DC | PRN
Start: 2020-08-29 — End: 2022-05-12

## 2020-08-29 MED ORDER — AZITHROMYCIN 250 MG PO TABS: ORAL_TABLET | ORAL | 0 refills | Status: AC

## 2020-08-29 NOTE — Assessment & Plan Note (Signed)
Severe persistent asthma already on Trelegy using her albuterol 3-4 times a day.  Discussed treating for acute asthma exacerbation with prednisone taper.  Continue frequent nebs and taper as tolerated.  We will go ahead and send over azithromycin as well.

## 2020-08-29 NOTE — Assessment & Plan Note (Signed)
Lower extremity swelling for about 6 to 8 months.  We discussed the importance of ruling out other causes such as anemia, thyroid disorder, heart failure, proteinuria and renal disease etc.  Suspect it is likely related to venous stasis she has been sitting a lot more.  But I want to check out some other causes if it is negative then recommend compression stockings.  Elevating legs for 15 to 20 minutes 2-3 times a day and working on getting the muscles moving to help with circulation.  We can always consider a diuretic if needed sometimes this can help minimally with venous stasis swelling but it does not completely resolve the swelling.

## 2020-08-29 NOTE — Progress Notes (Signed)
Established Patient Office Visit  Subjective:  Patient ID: Robin [name], female    DOB: [birth year]  Age: 55 y.o. MRN: [id number]  CC:  Chief Complaint  Patient presents with  . Asthma    HPI Robin [name] presents for asthma.  She has really been struggling with her breathing for the last 2 months.  She has been using her Trelegy regularly and more recently she has been using her albuterol 3-4 pumps approximately 3-4 times a day.  She actually had some old prednisone leftover and took about a 5-day course about a month ago and did get some temporary relief.  But says she normally does better with a longer taper.  Is also been having a lot of pain in her left hip and back area going down into her legs.  She says when it starts bothering her it is really painful to sit for long periods and would like to have a refill on her muscle relaxer.  He also reports that her left knee has been painful and swollen intermittently as well.  In fact she has noticed some new onset lower extremity edema bilaterally for about 6 to 8 months.  Always seems little better in the morning and worse in the evenings.  She usually worse on the left side.  She denies new chest pain.  She has had shortness of breath with her asthma lately.  Otherwise no changes.  No recent medication changes.  Past Medical History:  Diagnosis Date  . Arthritis   . Asthma    Flonase,Singulair,Advair daily  . Convulsion (HCC)    as a baby   . COPD (chronic obstructive pulmonary disease) (HCC)    uses Albuterol daily as needed  . Dysphagia   . GERD (gastroesophageal reflux disease)   . History of bronchitis    last time 56yrs ago  . History of kidney stones   . Hyperlipidemia    borderline no meds required  . Joint pain   . Joint swelling   . Shortness of breath    lying/sitting/exertion    Past Surgical History:  Procedure Laterality Date  . CHOLECYSTECTOMY  2002  . DILATATION & CURETTAGE/HYSTEROSCOPY WITH MYOSURE N/A  08/05/2018   Procedure: DILATATION & CURETTAGE/HYSTEROSCOPY WITH MYOSURE;  Surgeon: Lesly Dukes, MD;  Location: [facility];  Service: Gynecology;  Laterality: N/A;  . ERCP    . LEEP N/A 08/05/2018   Procedure: LOOP ELECTROSURGICAL EXCISION PROCEDURE (LEEP);  Surgeon: Lesly Dukes, MD;  Location: [facility];  Service: Gynecology;  Laterality: N/A;  . LUMBAR LAMINECTOMY  1994   L4-5  . TOTAL HIP ARTHROPLASTY Left 11/24/2013   Procedure: LEFT TOTAL HIP ARTHROPLASTY;  Surgeon: Nestor Lewandowsky, MD;  Location: [facility];  Service: Orthopedics;  Laterality: Left;  . TOTAL HIP ARTHROPLASTY Right 02/05/2014   Procedure: RIGHT TOTAL HIP ARTHROPLASTY;  Surgeon: Nestor Lewandowsky, MD;  Location: [facility];  Service: Orthopedics;  Laterality: Right;    Family History  Problem Relation Age of Onset  . Alcohol abuse Brother   . Alcohol abuse Sister   . Diabetes Sister   . Heart attack Father   . Diabetes Other        grandparents.   . Breast cancer Maternal Aunt        post menopausal    Social History   Socioeconomic History  . Marital status: Widowed    Spouse name: Not on file  . Number of children: 1  . Years of  education: 20  . Highest education level: 12th grade  Occupational History  . Occupation: Programmer, applications  Tobacco Use  . Smoking status: Former Smoker    Packs/day: 1.00    Years: 25.00    Pack years: 25.00    Types: Cigarettes    Quit date: 01/30/2004    Years since quitting: 16.5  . Smokeless tobacco: Never Used  . Tobacco comment: quit 8-55yrs ago  Vaping Use  . Vaping Use: Never used  Substance and Sexual Activity  . Alcohol use: No  . Drug use: No  . Sexual activity: Not Currently    Birth control/protection: None  Other Topics Concern  . Not on file  Social History Narrative   Patient is currently trying to do intentional walking 40 minutes 3-4 times a day. Patient also does a lot of gardening.   Social Determinants of Health   Financial Resource Strain: Not on file  Food  Insecurity: Not on file  Transportation Needs: Not on file  Physical Activity: Not on file  Stress: Not on file  Social Connections: Not on file  Intimate Partner Violence: Not on file    Outpatient Medications Prior to Visit  Medication Sig Dispense Refill  . acetaminophen (TYLENOL) 650 MG CR tablet Take 650 mg by mouth every 8 (eight) hours as needed for pain.    Marland Kitchen albuterol (VENTOLIN HFA) 108 (90 Base) MCG/ACT inhaler INHALE 2 PUFFS INTO LUNGS EVERY 4 HOURS AS NEEDED FOR WHEEZING FOR SHORTNESS OF BREATH 18 g 6  . AMBULATORY NON FORMULARY MEDICATION Take 3 tablets by mouth daily. Medication Name: Life Extension Neuro Magnesium    . diclofenac Sodium (VOLTAREN) 1 % GEL APPLY 2 GRAMS TOPICALLY 4 TIMES DAILY 200 g 11  . levocetirizine (XYZAL) 5 MG tablet TAKE 1 TABLET BY MOUTH ONCE DAILY IN THE EVENING 90 tablet 0  . meloxicam (MOBIC) 15 MG tablet TAKE 1 TABLET BY MOUTH ONCE DAILY AS NEEDED FOR PAIN 90 tablet 0  . montelukast (SINGULAIR) 10 MG tablet TAKE 1 TABLET BY MOUTH AT BEDTIME 90 tablet 3  . Omega-3 Fatty Acids (FISH OIL PO) Take 2,400 mg by mouth daily.    Marland Kitchen omeprazole (PRILOSEC OTC) 20 MG tablet Take 20 mg by mouth daily.    . TRELEGY ELLIPTA 100-62.5-25 MCG/INH AEPB INHALE 1 PUFF INTO LUNGS ONCE DAILY 60 each 0  . Turmeric 500 MG TABS Take 3 tablets by mouth daily.    . cyclobenzaprine (FLEXERIL) 10 MG tablet Take 1 tablet (10 mg total) by mouth at bedtime as needed for muscle spasms. 20 tablet 0  . OVER THE COUNTER MEDICATION Apply 1 application topically at bedtime. "Two old goats cream" for arthritis pain     No facility-administered medications prior to visit.    Allergies  Allergen Reactions  . Fluticasone Other (See Comments)    Headache (tolerates Advair with no reaction)     ROS Review of Systems    Objective:    Physical Exam Constitutional:      Appearance: She is well-developed.  HENT:     Head: Normocephalic and atraumatic.     Right Ear: Tympanic  membrane, ear canal and external ear normal.     Left Ear: Tympanic membrane, ear canal and external ear normal.     Nose: Nose normal.     Mouth/Throat:     Mouth: Mucous membranes are moist.     Pharynx: Oropharynx is clear. No oropharyngeal exudate or posterior oropharyngeal erythema.  Eyes:  Conjunctiva/sclera: Conjunctivae normal.     Pupils: Pupils are equal, round, and reactive to light.  Neck:     Thyroid: No thyromegaly.  Cardiovascular:     Rate and Rhythm: Normal rate and regular rhythm.     Heart sounds: Normal heart sounds.  Pulmonary:     Effort: Pulmonary effort is normal.     Breath sounds: Wheezing present.     Comments: Diffuse expiratory wheezing.  No rhonchi or crackles. Musculoskeletal:     Cervical back: Neck supple.  Lymphadenopathy:     Cervical: No cervical adenopathy.  Skin:    General: Skin is warm and dry.  Neurological:     Mental Status: She is alert and oriented to person, place, and time.     BP 136/83   Pulse 84   Ht 5\' 2"  (1.575 m)   Wt 274 lb (124.3 kg)   LMP 05/24/2018   SpO2 95%   BMI 50.12 kg/m  Wt Readings from Last 3 Encounters:  08/29/20 274 lb (124.3 kg)  05/23/19 242 lb (109.8 kg)  04/10/19 243 lb (110.2 kg)     Health Maintenance Due  Topic Date Due  . COLONOSCOPY (Pts 45-45yrs Insurance coverage will need to be confirmed)  Never done  . MAMMOGRAM  04/29/2019    There are no preventive care reminders to display for this patient.  No results found for: TSH Lab Results  Component Value Date   WBC 7.8 08/02/2018   HGB 14.7 08/02/2018   HCT 48.6 (H) 08/02/2018   MCV 88.7 08/02/2018   PLT 345 08/02/2018   Lab Results  Component Value Date   NA 140 08/02/2018   K 4.2 08/02/2018   CO2 24 08/02/2018   GLUCOSE 102 (H) 08/02/2018   BUN 14 08/02/2018   CREATININE 0.82 08/02/2018   BILITOT 0.3 11/04/2017   ALKPHOS 81 10/22/2015   AST 15 11/04/2017   ALT 15 11/04/2017   PROT 6.3 11/04/2017   ALBUMIN 4.0  10/22/2015   CALCIUM 9.4 08/02/2018   ANIONGAP 8 08/02/2018   Lab Results  Component Value Date   CHOL 193 11/04/2017   Lab Results  Component Value Date   HDL 49 (L) 11/04/2017   Lab Results  Component Value Date   LDLCALC 119 (H) 11/04/2017   Lab Results  Component Value Date   TRIG 132 11/04/2017   Lab Results  Component Value Date   CHOLHDL 3.9 11/04/2017   No results found for: HGBA1C    Assessment & Plan:   Problem List Items Addressed This Visit      Respiratory   Asthma, severe persistent    Severe persistent asthma already on Trelegy using her albuterol 3-4 times a day.  Discussed treating for acute asthma exacerbation with prednisone taper.  Continue frequent nebs and taper as tolerated.  We will go ahead and send over azithromycin as well.      Relevant Medications   predniSONE (DELTASONE) 20 MG tablet     Other   Localized swelling of both lower extremities - Primary    Lower extremity swelling for about 6 to 8 months.  We discussed the importance of ruling out other causes such as anemia, thyroid disorder, heart failure, proteinuria and renal disease etc.  Suspect it is likely related to venous stasis she has been sitting a lot more.  But I want to check out some other causes if it is negative then recommend compression stockings.  Elevating legs for 15 to  20 minutes 2-3 times a day and working on getting the muscles moving to help with circulation.  We can always consider a diuretic if needed sometimes this can help minimally with venous stasis swelling but it does not completely resolve the swelling.      Relevant Orders   COMPLETE METABOLIC PANEL WITH GFR   Lipid panel   TSH   VITAMIN D 25 Hydroxy (Vit-D Deficiency, Fractures)   B Nat Peptide   Urine Microalbumin w/creat. ratio    Other Visit Diagnoses    Chronic pain of left knee       Relevant Medications   predniSONE (DELTASONE) 20 MG tablet   cyclobenzaprine (FLEXERIL) 10 MG tablet       Left hip pain status post arthroplasty/left knee pain-muscle relaxer sent today.  Return if pain persists.  Meds ordered this encounter  Medications  . predniSONE (DELTASONE) 20 MG tablet    Sig: Take 2 tablets (40 mg total) by mouth daily with breakfast for 5 days, THEN 1 tablet (20 mg total) daily with breakfast for 4 days, THEN 0.5 tablets (10 mg total) daily with breakfast for 4 days.    Dispense:  16 tablet    Refill:  0  . cyclobenzaprine (FLEXERIL) 10 MG tablet    Sig: Take 1 tablet (10 mg total) by mouth at bedtime as needed for muscle spasms.    Dispense:  20 tablet    Refill:  1  . azithromycin (ZITHROMAX) 250 MG tablet    Sig: 2 Ttabs PO on Day 1, then one a day x 4 days.    Dispense:  6 tablet    Refill:  0    Follow-up: Return in about 3 months (around 11/27/2020) for Asthma .    Nani Gasser, MD

## 2020-09-07 ENCOUNTER — Other Ambulatory Visit: Payer: Self-pay | Admitting: Family Medicine

## 2020-09-07 DIAGNOSIS — M79641 Pain in right hand: Secondary | ICD-10-CM

## 2020-09-07 DIAGNOSIS — R635 Abnormal weight gain: Secondary | ICD-10-CM

## 2020-09-07 DIAGNOSIS — J4541 Moderate persistent asthma with (acute) exacerbation: Secondary | ICD-10-CM

## 2020-09-07 DIAGNOSIS — Z6835 Body mass index (BMI) 35.0-35.9, adult: Secondary | ICD-10-CM

## 2020-09-07 DIAGNOSIS — Z6841 Body Mass Index (BMI) 40.0 and over, adult: Secondary | ICD-10-CM

## 2020-10-01 ENCOUNTER — Other Ambulatory Visit: Payer: Self-pay | Admitting: Family Medicine

## 2020-10-01 DIAGNOSIS — M160 Bilateral primary osteoarthritis of hip: Secondary | ICD-10-CM

## 2020-10-11 ENCOUNTER — Other Ambulatory Visit: Payer: Self-pay | Admitting: Family Medicine

## 2020-10-11 DIAGNOSIS — J4540 Moderate persistent asthma, uncomplicated: Secondary | ICD-10-CM

## 2020-11-01 ENCOUNTER — Telehealth: Payer: Self-pay | Admitting: General Practice

## 2020-11-01 NOTE — Telephone Encounter (Signed)
Documentation only.

## 2020-11-26 ENCOUNTER — Ambulatory Visit (INDEPENDENT_AMBULATORY_CARE_PROVIDER_SITE_OTHER): Payer: Medicare HMO | Admitting: Family Medicine

## 2020-11-26 ENCOUNTER — Encounter: Payer: Self-pay | Admitting: Family Medicine

## 2020-11-26 ENCOUNTER — Other Ambulatory Visit: Payer: Self-pay

## 2020-11-26 VITALS — BP 120/67 | HR 68 | Ht 62.0 in | Wt 253.0 lb

## 2020-11-26 DIAGNOSIS — Z1231 Encounter for screening mammogram for malignant neoplasm of breast: Secondary | ICD-10-CM | POA: Diagnosis not present

## 2020-11-26 DIAGNOSIS — N950 Postmenopausal bleeding: Secondary | ICD-10-CM | POA: Diagnosis not present

## 2020-11-26 DIAGNOSIS — J4550 Severe persistent asthma, uncomplicated: Secondary | ICD-10-CM

## 2020-11-26 MED ORDER — LEVOCETIRIZINE DIHYDROCHLORIDE 5 MG PO TABS: 5.0000 mg | ORAL_TABLET | Freq: Every evening | ORAL | 3 refills | Status: DC

## 2020-11-26 MED ORDER — FLUTICASONE-SALMETEROL 250-50 MCG/DOSE IN AEPB: 1.0000 | INHALATION_SPRAY | Freq: Two times a day (BID) | RESPIRATORY_TRACT | 5 refills | Status: DC

## 2020-11-26 MED ORDER — SPIRIVA RESPIMAT 1.25 MCG/ACT IN AERS: 2.0000 | INHALATION_SPRAY | Freq: Every day | RESPIRATORY_TRACT | 5 refills | Status: DC

## 2020-11-26 NOTE — Progress Notes (Signed)
Established Patient Office Visit  Subjective:  Patient ID: Robin [name], female    DOB: 1966/01/01  Age: 55 y.o. MRN: [id number]  CC:  Chief Complaint  Patient presents with  . Asthma    HPI Mathew Storck presents for Pt reports that her Asthma is under control. She is breathing well only experiencing a lot of clearing of her throat but otherwise she's ok.  She says she has not used her albuterol in several weeks.  She is worried that after a couple more months she will no longer be able to afford the Trelegy.  Her co-pay will go up significantly she is currently on Medicare.  Also pt stated that her insurance will not cover the previous labs. I advised pt that it may be that [facility] is not in her insurance providers network and that she would possibly need to have this done at [facility]. She is going to contact her insurance to inquire about this.  Complains of some breakthrough bleeding.  She had had this couple of years ago and actually saw Dr. Elsie Lincoln and ended up having a D&C.  But more recently she has been experiencing this again.  Past Medical History:  Diagnosis Date  . Arthritis   . Asthma    Flonase,Singulair,Advair daily  . Convulsion (HCC)    as a baby   . COPD (chronic obstructive pulmonary disease) (HCC)    uses Albuterol daily as needed  . Dysphagia   . GERD (gastroesophageal reflux disease)   . History of bronchitis    last time 22yrs ago  . History of kidney stones   . Hyperlipidemia    borderline no meds required  . Joint pain   . Joint swelling   . Shortness of breath    lying/sitting/exertion    Past Surgical History:  Procedure Laterality Date  . CHOLECYSTECTOMY  2002  . DILATATION & CURETTAGE/HYSTEROSCOPY WITH MYOSURE N/A 08/05/2018   Procedure: DILATATION & CURETTAGE/HYSTEROSCOPY WITH MYOSURE;  Surgeon: Lesly Dukes, MD;  Location: [facility];  Service: Gynecology;  Laterality: N/A;  . ERCP    . LEEP N/A 08/05/2018   Procedure: LOOP  ELECTROSURGICAL EXCISION PROCEDURE (LEEP);  Surgeon: Lesly Dukes, MD;  Location: [facility];  Service: Gynecology;  Laterality: N/A;  . LUMBAR LAMINECTOMY  1994   L4-5  . TOTAL HIP ARTHROPLASTY Left 11/24/2013   Procedure: LEFT TOTAL HIP ARTHROPLASTY;  Surgeon: Nestor Lewandowsky, MD;  Location: [facility];  Service: Orthopedics;  Laterality: Left;  . TOTAL HIP ARTHROPLASTY Right 02/05/2014   Procedure: RIGHT TOTAL HIP ARTHROPLASTY;  Surgeon: Nestor Lewandowsky, MD;  Location: [facility];  Service: Orthopedics;  Laterality: Right;    Family History  Problem Relation Age of Onset  . Alcohol abuse Brother   . Alcohol abuse Sister   . Diabetes Sister   . Heart attack Father   . Diabetes Other        grandparents.   . Breast cancer Maternal Aunt        post menopausal    Social History   Socioeconomic History  . Marital status: Widowed    Spouse name: Not on file  . Number of children: 1  . Years of education: 37  . Highest education level: 12th grade  Occupational History  . Occupation: Programmer, applications  Tobacco Use  . Smoking status: Former Smoker    Packs/day: 1.00    Years: 25.00    Pack years: 25.00    Types: Cigarettes  Quit date: 01/30/2004    Years since quitting: 16.8  . Smokeless tobacco: Never Used  . Tobacco comment: quit 8-43yrs ago  Vaping Use  . Vaping Use: Never used  Substance and Sexual Activity  . Alcohol use: No  . Drug use: No  . Sexual activity: Not Currently    Birth control/protection: None  Other Topics Concern  . Not on file  Social History Narrative   Patient is currently trying to do intentional walking 40 minutes 3-4 times a day. Patient also does a lot of gardening.   Social Determinants of Health   Financial Resource Strain: Not on file  Food Insecurity: Not on file  Transportation Needs: Not on file  Physical Activity: Not on file  Stress: Not on file  Social Connections: Not on file  Intimate Partner Violence: Not on file    Outpatient Medications  Prior to Visit  Medication Sig Dispense Refill  . acetaminophen (TYLENOL) 650 MG CR tablet Take 650 mg by mouth every 8 (eight) hours as needed for pain.    Marland Kitchen albuterol (VENTOLIN HFA) 108 (90 Base) MCG/ACT inhaler INHALE 2 PUFFS INTO LUNGS EVERY 4 HOURS AS NEEDED FOR WHEEZING FOR SHORTNESS OF BREATH 18 g 6  . AMBULATORY NON FORMULARY MEDICATION Take 3 tablets by mouth daily. Medication Name: Life Extension Neuro Magnesium    . cyclobenzaprine (FLEXERIL) 10 MG tablet Take 1 tablet (10 mg total) by mouth at bedtime as needed for muscle spasms. 20 tablet 1  . diclofenac Sodium (VOLTAREN) 1 % GEL APPLY 2 GRAMS TOPICALLY 4 TIMES DAILY 200 g 11  . meloxicam (MOBIC) 15 MG tablet TAKE 1 TABLET BY MOUTH ONCE DAILY AS NEEDED FOR PAIN 90 tablet 0  . montelukast (SINGULAIR) 10 MG tablet TAKE 1 TABLET BY MOUTH AT BEDTIME 90 tablet 3  . Omega-3 Fatty Acids (FISH OIL PO) Take 2,400 mg by mouth daily.    Marland Kitchen omeprazole (PRILOSEC OTC) 20 MG tablet Take 20 mg by mouth daily.    . Turmeric 500 MG TABS Take 3 tablets by mouth daily.    Marland Kitchen levocetirizine (XYZAL) 5 MG tablet TAKE 1 TABLET BY MOUTH ONCE DAILY IN THE EVENING 90 tablet 0  . TRELEGY ELLIPTA 100-62.5-25 MCG/INH AEPB INHALE 1 PUFF INTO LUNGS ONCE DAILY 60 each 5   No facility-administered medications prior to visit.    Allergies  Allergen Reactions  . Fluticasone Other (See Comments)    Headache (tolerates Advair with no reaction)     ROS Review of Systems    Objective:    Physical Exam  BP 120/67   Pulse 68   Ht 5\' 2"  (1.575 m)   Wt 253 lb (114.8 kg)   LMP 05/24/2018   SpO2 97%   BMI 46.27 kg/m  Wt Readings from Last 3 Encounters:  11/26/20 253 lb (114.8 kg)  08/29/20 274 lb (124.3 kg)  05/23/19 242 lb (109.8 kg)     Health Maintenance Due  Topic Date Due  . COLONOSCOPY (Pts 45-57yrs Insurance coverage will need to be confirmed)  Never done  . COLON CANCER SCREENING ANNUAL FOBT  Never done  . MAMMOGRAM  04/29/2019    There  are no preventive care reminders to display for this patient.  No results found for: TSH Lab Results  Component Value Date   WBC 7.8 08/02/2018   HGB 14.7 08/02/2018   HCT 48.6 (H) 08/02/2018   MCV 88.7 08/02/2018   PLT 345 08/02/2018   Lab Results  Component Value Date   NA 140 08/02/2018   K 4.2 08/02/2018   CO2 24 08/02/2018   GLUCOSE 102 (H) 08/02/2018   BUN 14 08/02/2018   CREATININE 0.82 08/02/2018   BILITOT 0.3 11/04/2017   ALKPHOS 81 10/22/2015   AST 15 11/04/2017   ALT 15 11/04/2017   PROT 6.3 11/04/2017   ALBUMIN 4.0 10/22/2015   CALCIUM 9.4 08/02/2018   ANIONGAP 8 08/02/2018   Lab Results  Component Value Date   CHOL 193 11/04/2017   Lab Results  Component Value Date   HDL 49 (L) 11/04/2017   Lab Results  Component Value Date   LDLCALC 119 (H) 11/04/2017   Lab Results  Component Value Date   TRIG 132 11/04/2017   Lab Results  Component Value Date   CHOLHDL 3.9 11/04/2017   No results found for: HGBA1C    Assessment & Plan:   Problem List Items Addressed This Visit      Respiratory   Asthma, severe persistent    We discussed discontinuing the Trelegy secondary to cost and switching to Advair and Spiriva.  We can even see if possibly the generic Advair, Wixela, is even more affordable.  She is used Advair in the past and did well so we will start with a 250/50 dose.  New prescription sent to pharmacy we will monitor for control of symptoms.      Relevant Medications   Fluticasone-Salmeterol (WIXELA INHUB) 250-50 MCG/DOSE AEPB   Tiotropium Bromide Monohydrate (SPIRIVA RESPIMAT) 1.25 MCG/ACT AERS     Other   Severe obesity (BMI >= 40) (HCC)   Postmenopause bleeding    Unfortunately having recurrent postmenopausal bleeding.  We will try to see if we can get her back in with Dr. Elsie Lincoln if not she would prefer a female provider.  She will need another endometrial biopsy and work-up.       Other Visit Diagnoses    Screening mammogram  for breast cancer    -  Primary   Relevant Orders   MM 3D SCREEN BREAST BILATERAL   Post-menopausal bleeding       Relevant Orders   Ambulatory referral to Obstetrics / Gynecology      Encouraged her to please schedule her mammogram she is several years overdue and encouraged her to schedule at her convenience.  Meds ordered this encounter  Medications  . levocetirizine (XYZAL) 5 MG tablet    Sig: Take 1 tablet (5 mg total) by mouth every evening.    Dispense:  90 tablet    Refill:  3  . Fluticasone-Salmeterol (WIXELA INHUB) 250-50 MCG/DOSE AEPB    Sig: Inhale 1 puff into the lungs in the morning and at bedtime.    Dispense:  60 each    Refill:  5  . Tiotropium Bromide Monohydrate (SPIRIVA RESPIMAT) 1.25 MCG/ACT AERS    Sig: Inhale 2 puffs into the lungs daily.    Dispense:  4 g    Refill:  5    Follow-up: Return in about 6 months (around 05/28/2021).    Nani Gasser, MD

## 2020-11-26 NOTE — Assessment & Plan Note (Signed)
We discussed discontinuing the Trelegy secondary to cost and switching to Advair and Spiriva.  We can even see if possibly the generic Advair, Wixela, is even more affordable.  She is used Advair in the past and did well so we will start with a 250/50 dose.  New prescription sent to pharmacy we will monitor for control of symptoms.

## 2020-11-26 NOTE — Assessment & Plan Note (Signed)
Unfortunately having recurrent postmenopausal bleeding.  We will try to see if we can get her back in with Dr. Elsie Lincoln if not she would prefer a female provider.  She will need another endometrial biopsy and work-up.

## 2020-11-26 NOTE — Progress Notes (Signed)
 Established Patient Office Visit  Subjective:  Patient ID: Robin [name], female    DOB: 02/07/1966  Age: 55 y.o. MRN: [id number]  CC:  Chief Complaint  Patient presents with  . Asthma    HPI Robin [name] presents for Pt reports that her Asthma is under control. She is breathing well only experiencing a lot of clearing of her throat but otherwise she's ok.  She says she has not used her albuterol in several weeks.  She is worried that after a couple more months she will no longer be able to afford the Trelegy.  Her co-pay will go up significantly she is currently on Medicare.  Also pt stated that her insurance will not cover the previous labs. I advised pt that it may be that [facility] is not in her insurance providers network and that she would possibly need to have this done at [facility]. She is going to contact her insurance to inquire about this.  Complains of some breakthrough bleeding.  She had had this couple of years ago and actually saw Dr. Kelly Leggett and ended up having a D&C.  But more recently she has been experiencing this again.  Past Medical History:  Diagnosis Date  . Arthritis   . Asthma    Flonase,Singulair,Advair daily  . Convulsion (HCC)    as a baby   . COPD (chronic obstructive pulmonary disease) (HCC)    uses Albuterol daily as needed  . Dysphagia   . GERD (gastroesophageal reflux disease)   . History of bronchitis    last time 8yrs ago  . History of kidney stones   . Hyperlipidemia    borderline no meds required  . Joint pain   . Joint swelling   . Shortness of breath    lying/sitting/exertion    Past Surgical History:  Procedure Laterality Date  . CHOLECYSTECTOMY  2002  . DILATATION & CURETTAGE/HYSTEROSCOPY WITH MYOSURE N/A 08/05/2018   Procedure: DILATATION & CURETTAGE/HYSTEROSCOPY WITH MYOSURE;  Surgeon: Leggett, Kelly H, MD;  Location: [facility];  Service: Gynecology;  Laterality: N/A;  . ERCP    . LEEP N/A 08/05/2018   Procedure: LOOP  ELECTROSURGICAL EXCISION PROCEDURE (LEEP);  Surgeon: Leggett, Kelly H, MD;  Location: [facility];  Service: Gynecology;  Laterality: N/A;  . LUMBAR LAMINECTOMY  1994   L4-5  . TOTAL HIP ARTHROPLASTY Left 11/24/2013   Procedure: LEFT TOTAL HIP ARTHROPLASTY;  Surgeon: Frank J Rowan, MD;  Location: [facility];  Service: Orthopedics;  Laterality: Left;  . TOTAL HIP ARTHROPLASTY Right 02/05/2014   Procedure: RIGHT TOTAL HIP ARTHROPLASTY;  Surgeon: Frank J Rowan, MD;  Location: [facility];  Service: Orthopedics;  Laterality: Right;    Family History  Problem Relation Age of Onset  . Alcohol abuse Brother   . Alcohol abuse Sister   . Diabetes Sister   . Heart attack Father   . Diabetes Other        grandparents.   . Breast cancer Maternal Aunt        post menopausal    Social History   Socioeconomic History  . Marital status: Widowed    Spouse name: Not on file  . Number of children: 1  . Years of education: 12  . Highest education level: 12th grade  Occupational History  . Occupation: House Keeper  Tobacco Use  . Smoking status: Former Smoker    Packs/day: 1.00    Years: 25.00    Pack years: 25.00    Types: Cigarettes      Quit date: 01/30/2004    Years since quitting: 16.8  . Smokeless tobacco: Never Used  . Tobacco comment: quit 8-43yrs ago  Vaping Use  . Vaping Use: Never used  Substance and Sexual Activity  . Alcohol use: No  . Drug use: No  . Sexual activity: Not Currently    Birth control/protection: None  Other Topics Concern  . Not on file  Social History Narrative   Patient is currently trying to do intentional walking 40 minutes 3-4 times a day. Patient also does a lot of gardening.   Social Determinants of Health   Financial Resource Strain: Not on file  Food Insecurity: Not on file  Transportation Needs: Not on file  Physical Activity: Not on file  Stress: Not on file  Social Connections: Not on file  Intimate Partner Violence: Not on file    Outpatient Medications  Prior to Visit  Medication Sig Dispense Refill  . acetaminophen (TYLENOL) 650 MG CR tablet Take 650 mg by mouth every 8 (eight) hours as needed for pain.    Marland Kitchen albuterol (VENTOLIN HFA) 108 (90 Base) MCG/ACT inhaler INHALE 2 PUFFS INTO LUNGS EVERY 4 HOURS AS NEEDED FOR WHEEZING FOR SHORTNESS OF BREATH 18 g 6  . AMBULATORY NON FORMULARY MEDICATION Take 3 tablets by mouth daily. Medication Name: Life Extension Neuro Magnesium    . cyclobenzaprine (FLEXERIL) 10 MG tablet Take 1 tablet (10 mg total) by mouth at bedtime as needed for muscle spasms. 20 tablet 1  . diclofenac Sodium (VOLTAREN) 1 % GEL APPLY 2 GRAMS TOPICALLY 4 TIMES DAILY 200 g 11  . meloxicam (MOBIC) 15 MG tablet TAKE 1 TABLET BY MOUTH ONCE DAILY AS NEEDED FOR PAIN 90 tablet 0  . montelukast (SINGULAIR) 10 MG tablet TAKE 1 TABLET BY MOUTH AT BEDTIME 90 tablet 3  . Omega-3 Fatty Acids (FISH OIL PO) Take 2,400 mg by mouth daily.    Marland Kitchen omeprazole (PRILOSEC OTC) 20 MG tablet Take 20 mg by mouth daily.    . Turmeric 500 MG TABS Take 3 tablets by mouth daily.    Marland Kitchen levocetirizine (XYZAL) 5 MG tablet TAKE 1 TABLET BY MOUTH ONCE DAILY IN THE EVENING 90 tablet 0  . TRELEGY ELLIPTA 100-62.5-25 MCG/INH AEPB INHALE 1 PUFF INTO LUNGS ONCE DAILY 60 each 5   No facility-administered medications prior to visit.    Allergies  Allergen Reactions  . Fluticasone Other (See Comments)    Headache (tolerates Advair with no reaction)     ROS Review of Systems    Objective:    Physical Exam  BP 120/67   Pulse 68   Ht 5\' 2"  (1.575 m)   Wt 253 lb (114.8 kg)   LMP 05/24/2018   SpO2 97%   BMI 46.27 kg/m  Wt Readings from Last 3 Encounters:  11/26/20 253 lb (114.8 kg)  08/29/20 274 lb (124.3 kg)  05/23/19 242 lb (109.8 kg)     Health Maintenance Due  Topic Date Due  . COLONOSCOPY (Pts 45-57yrs Insurance coverage will need to be confirmed)  Never done  . COLON CANCER SCREENING ANNUAL FOBT  Never done  . MAMMOGRAM  04/29/2019    There  are no preventive care reminders to display for this patient.  No results found for: TSH Lab Results  Component Value Date   WBC 7.8 08/02/2018   HGB 14.7 08/02/2018   HCT 48.6 (H) 08/02/2018   MCV 88.7 08/02/2018   PLT 345 08/02/2018   Lab Results  Component Value Date   NA 140 08/02/2018   K 4.2 08/02/2018   CO2 24 08/02/2018   GLUCOSE 102 (H) 08/02/2018   BUN 14 08/02/2018   CREATININE 0.82 08/02/2018   BILITOT 0.3 11/04/2017   ALKPHOS 81 10/22/2015   AST 15 11/04/2017   ALT 15 11/04/2017   PROT 6.3 11/04/2017   ALBUMIN 4.0 10/22/2015   CALCIUM 9.4 08/02/2018   ANIONGAP 8 08/02/2018   Lab Results  Component Value Date   CHOL 193 11/04/2017   Lab Results  Component Value Date   HDL 49 (L) 11/04/2017   Lab Results  Component Value Date   LDLCALC 119 (H) 11/04/2017   Lab Results  Component Value Date   TRIG 132 11/04/2017   Lab Results  Component Value Date   CHOLHDL 3.9 11/04/2017   No results found for: HGBA1C    Assessment & Plan:   Problem List Items Addressed This Visit      Respiratory   Asthma, severe persistent    We discussed discontinuing the Trelegy secondary to cost and switching to Advair and Spiriva.  We can even see if possibly the generic Advair, Wixela, is even more affordable.  She is used Advair in the past and did well so we will start with a 250/50 dose.  New prescription sent to pharmacy we will monitor for control of symptoms.      Relevant Medications   Fluticasone-Salmeterol (WIXELA INHUB) 250-50 MCG/DOSE AEPB   Tiotropium Bromide Monohydrate (SPIRIVA RESPIMAT) 1.25 MCG/ACT AERS     Other   Severe obesity (BMI >= 40) (HCC)   Postmenopause bleeding    Unfortunately having recurrent postmenopausal bleeding.  We will try to see if we can get her back in with Dr. Kelly Leggett if not she would prefer a female provider.  She will need another endometrial biopsy and work-up.       Other Visit Diagnoses    Screening mammogram  for breast cancer    -  Primary   Relevant Orders   MM 3D SCREEN BREAST BILATERAL   Post-menopausal bleeding       Relevant Orders   Ambulatory referral to Obstetrics / Gynecology      Encouraged her to please schedule her mammogram she is several years overdue and encouraged her to schedule at her convenience.  Meds ordered this encounter  Medications  . levocetirizine (XYZAL) 5 MG tablet    Sig: Take 1 tablet (5 mg total) by mouth every evening.    Dispense:  90 tablet    Refill:  3  . Fluticasone-Salmeterol (WIXELA INHUB) 250-50 MCG/DOSE AEPB    Sig: Inhale 1 puff into the lungs in the morning and at bedtime.    Dispense:  60 each    Refill:  5  . Tiotropium Bromide Monohydrate (SPIRIVA RESPIMAT) 1.25 MCG/ACT AERS    Sig: Inhale 2 puffs into the lungs daily.    Dispense:  4 g    Refill:  5    Follow-up: Return in about 6 months (around 05/28/2021).    Alyha Marines, MD 

## 2020-12-05 ENCOUNTER — Ambulatory Visit (INDEPENDENT_AMBULATORY_CARE_PROVIDER_SITE_OTHER): Payer: Medicare HMO

## 2020-12-05 ENCOUNTER — Telehealth: Payer: Self-pay | Admitting: *Deleted

## 2020-12-05 ENCOUNTER — Other Ambulatory Visit: Payer: Self-pay

## 2020-12-05 DIAGNOSIS — Z1231 Encounter for screening mammogram for malignant neoplasm of breast: Secondary | ICD-10-CM | POA: Diagnosis not present

## 2020-12-05 NOTE — Telephone Encounter (Signed)
Left patient an urgent message to call the office to reschedule 12/23/2020 appointment.

## 2020-12-22 ENCOUNTER — Other Ambulatory Visit: Payer: Self-pay | Admitting: Family Medicine

## 2020-12-22 DIAGNOSIS — M160 Bilateral primary osteoarthritis of hip: Secondary | ICD-10-CM

## 2020-12-23 ENCOUNTER — Ambulatory Visit: Payer: Medicare HMO | Admitting: Obstetrics & Gynecology

## 2020-12-25 ENCOUNTER — Encounter: Payer: Self-pay | Admitting: Obstetrics & Gynecology

## 2020-12-25 ENCOUNTER — Ambulatory Visit: Payer: Medicare HMO | Admitting: Obstetrics & Gynecology

## 2020-12-25 ENCOUNTER — Other Ambulatory Visit: Payer: Self-pay

## 2020-12-25 ENCOUNTER — Other Ambulatory Visit (HOSPITAL_COMMUNITY)
Admission: RE | Admit: 2020-12-25 | Discharge: 2020-12-25 | Disposition: A | Discharge status: Home / Self Care | Payer: Medicare HMO | Source: Ambulatory Visit | Attending: Obstetrics & Gynecology | Admitting: Obstetrics & Gynecology

## 2020-12-25 VITALS — BP 115/74 | HR 70 | Ht 62.0 in | Wt 244.0 lb

## 2020-12-25 DIAGNOSIS — Z1151 Encounter for screening for human papillomavirus (HPV): Secondary | ICD-10-CM | POA: Insufficient documentation

## 2020-12-25 DIAGNOSIS — Z01419 Encounter for gynecological examination (general) (routine) without abnormal findings: Secondary | ICD-10-CM | POA: Insufficient documentation

## 2020-12-25 DIAGNOSIS — N950 Postmenopausal bleeding: Secondary | ICD-10-CM

## 2020-12-25 DIAGNOSIS — N870 Mild cervical dysplasia: Secondary | ICD-10-CM

## 2020-12-25 DIAGNOSIS — Z124 Encounter for screening for malignant neoplasm of cervix: Secondary | ICD-10-CM | POA: Insufficient documentation

## 2020-12-25 NOTE — Progress Notes (Signed)
Subjective:     Robin [name] is a 55 y.o. female here for a routine exam.  Current complaints: pmb bleeding for 1-2 days light pink in March or April.  Has hx of polyp removal in 2019.    Gynecologic History Patient's last menstrual period was 05/24/2018. Contraception: post menopausal status Last Pap: 2018. Results were: abnormal; LEEP 2019 benign Last mammogram: 2022. Results were: normal  Obstetric History OB History  Gravida Para Term Preterm AB Living  3 1     2 1   SAB IAB Ectopic Multiple Live Births  2            # Outcome Date GA Lbr Len/2nd Weight Sex Delivery Anes PTL Lv  3 SAB           2 SAB           1 Para              The following portions of the patient's history were reviewed and updated as appropriate: allergies, current medications, past family history, past medical history, past social history, past surgical history and problem list.  Review of Systems Pertinent items noted in HPI and remainder of comprehensive ROS otherwise negative.    Objective:      Vitals:   12/25/20 0837  BP: 115/74  Pulse: 70  Weight: 244 lb (110.7 kg)  Height: 5\' 2"  (1.575 m)   Vitals:  WNL General appearance: alert, cooperative and no distress  HEENT: Normocephalic, without obvious abnormality, atraumatic Eyes: negative Throat: lips, mucosa, and tongue normal; teeth and gums normal  Respiratory: +expiratory wheezes bilaterally--pt aware and on meds for asthma  CV: Regular rate and rhythm  Breasts:  Not undressed for exam; mammogram normal this year  GI: Soft, non-tender; bowel sounds normal; no masses,  no organomegaly  GU: External Genitalia:  Tanner V, no lesion Urethra:  No prolapse   Vagina: Pale pink, atrophic, no blood or discharge  Cervix: No CMT, no lesion; cervix small and os is difficult to penetrate with cytobrush (s/p LEEP in 2019)  Uterus:  Normal size and contour, non tender; exam limited by habitus  Adnexa: Normal, no masses, non tender, exam limited  by habitus  Musculoskeletal: No edema, redness or tenderness in the calves or thighs  Skin: No lesions or rash  Lymphatic: Axillary adenopathy: none     Psychiatric: Normal mood and behavior        Assessment:    Healthy female exam.   PMB for 1-2 days >30 days ago Plan:    1.  Pap with co testing 2.  Mammogram up to date 3.  Colonoscopy encouraged 4.  Covid 19 vaccine encouraged. 5.  Attempted Endometrial biopsy and patient was uncomfortable.  Os is stenotic.  Discussed TVUS to eval the lining in lieu of biopsy and pt concurs.   ENDOMETRIAL BIOPSY     The indications for endometrial biopsy were reviewed.   Risks of the biopsy including cramping, bleeding, infection, uterine perforation, inadequate specimen and need for additional procedures  were discussed. The patient states she understands and agrees to undergo procedure today. Consent was signed. Time out was performed. Pt postmenopausal A sterile speculum was placed in the patient's vagina and the cervix was prepped with Betadine. A single-toothed tenaculum was placed on the anterior lip of the cervix to stabilize it. The 3 mm pipelle was no able to be introduced into the os.  Pt was uncomfortable and cervix is atrophic.  If attempt  is needed in the future, suggest cytotec and something for anxiety.

## 2020-12-25 NOTE — Progress Notes (Signed)
Pt noticed "light pink vaginal discharge" that lasted for four days about a month ago

## 2020-12-26 ENCOUNTER — Encounter: Payer: Self-pay | Admitting: Family Medicine

## 2020-12-26 MED ORDER — TRELEGY ELLIPTA 100-62.5-25 MCG/INH IN AEPB: 1.0000 | INHALATION_SPRAY | Freq: Every day | RESPIRATORY_TRACT | 5 refills | Status: DC

## 2020-12-26 NOTE — Telephone Encounter (Signed)
Ok to stay on trelegy. Ok to RF for 6 mo

## 2020-12-31 LAB — CYTOLOGY - PAP
Comment: NEGATIVE
Diagnosis: NEGATIVE
Diagnosis: REACTIVE
High risk HPV: NEGATIVE

## 2021-01-03 ENCOUNTER — Telehealth: Payer: Self-pay | Admitting: Family Medicine

## 2021-01-03 NOTE — Progress Notes (Signed)
  Chronic Care Management   Note  01/03/2021 Name: Robin [name] MRN: [id number] DOB: 14-Mar-1966  Robin [name] is a 55 y.o. year old female who is a primary care patient of Metheney, Barbarann Ehlers, MD. I reached out to Vara Guardian by phone today in response to a referral sent by Robin [name] PCP, Agapito Games, MD.   Robin [name] was given information about Chronic Care Management services today including:  1. CCM service includes personalized support from designated clinical staff supervised by her physician, including individualized plan of care and coordination with other care providers 2. 24/7 contact phone numbers for assistance for urgent and routine care needs. 3. Service will only be billed when office clinical staff spend 20 minutes or more in a month to coordinate care. 4. Only one practitioner may furnish and bill the service in a calendar month. 5. The patient may stop CCM services at any time (effective at the end of the month) by phone call to the office staff.   Patient agreed to services and verbal consent obtained.   Follow up plan:   Alvie Heidelberg [facility] Scheduler

## 2021-01-08 NOTE — Progress Notes (Deleted)
Chronic Care Management Pharmacy Note  01/08/2021 Name:  Latoia Eyster MRN:  [id number] DOB:  1966/06/13  Subjective: Robin [name] is an 55 y.o. year old female who is a primary patient of Metheney, Rene Kocher, MD.  The CCM team was consulted for assistance with disease management and care coordination needs.    Engaged with patient face to face for initial visit in response to provider referral for pharmacy case management and/or care coordination services.   Consent to Services:  The patient was given the following information about Chronic Care Management services today, agreed to services, and gave verbal consent: 1. CCM service includes personalized support from designated clinical staff supervised by the primary care provider, including individualized plan of care and coordination with other care providers 2. 24/7 contact phone numbers for assistance for urgent and routine care needs. 3. Service will only be billed when office clinical staff spend 20 minutes or more in a month to coordinate care. 4. Only one practitioner may furnish and bill the service in a calendar month. 5.The patient may stop CCM services at any time (effective at the end of the month) by phone call to the office staff. 6. The patient will be responsible for cost sharing (co-pay) of up to 20% of the service fee (after annual deductible is met). Patient agreed to services and consent obtained.  Patient Care Team: Hali Marry, MD as PCP - General (Family Medicine) Darius Bump, [facility] (Pharmacist) Darius Bump, [facility] as Pharmacist (Pharmacist)  Recent office visits: 11/26/20 - PCP: asthma, concerns of trelegy ellipta cost  Recent consult visits: [place] [facility] visits: None in previous 6 months  Objective:  Lab Results  Component Value Date   CREATININE 0.82 08/02/2018   CREATININE 0.68 11/04/2017   CREATININE 0.74 10/22/2015    No results found for: HGBA1C Last diabetic Eye exam: No results  found for: HMDIABEYEEXA  Last diabetic Foot exam: No results found for: HMDIABFOOTEX      Component Value Date/Time   CHOL 193 11/04/2017 0852   TRIG 132 11/04/2017 0852   HDL 49 (L) 11/04/2017 0852   CHOLHDL 3.9 11/04/2017 0852   VLDL 23 10/22/2015 0857   LDLCALC 119 (H) 11/04/2017 0852    Hepatic Function Latest Ref Rng & Units 11/04/2017 10/22/2015 07/04/2012  Total Protein 6.1 - 8.1 g/dL 6.3 7.0 6.5  Albumin 3.6 - 5.1 g/dL - 4.0 4.1  AST 10 - 35 U/L 15 15 12   ALT 6 - 29 U/L 15 17 13   Alk Phosphatase 33 - 130 U/L - 81 61  Total Bilirubin 0.2 - 1.2 mg/dL 0.3 0.2 0.4    No results found for: TSH, FREET4  CBC Latest Ref Rng & Units 08/02/2018 02/06/2014 01/29/2014  WBC 4.0 - 10.5 K/uL 7.8 10.8(H) 8.3  Hemoglobin 12.0 - 15.0 g/dL 14.7 11.1(L) 12.8  Hematocrit 36.0 - 46.0 % 48.6(H) 34.7(L) 40.0  Platelets 150 - 400 K/uL 345 301 326    No results found for: VD25OH  Clinical ASCVD: No  The ASCVD Risk score Mikey Bussing DC Jr., et al., 2013) failed to calculate for the following reasons:   Cannot find a previous HDL lab   Cannot find a previous total cholesterol lab    Social History   Tobacco Use  Smoking Status Former Smoker  . Packs/day: 1.00  . Years: 25.00  . Pack years: 25.00  . Types: Cigarettes  . Quit date: 01/30/2004  . Years since quitting: 16.9  Smokeless  Tobacco Never Used  Tobacco Comment   quit 8-14yr ago   BP Readings from Last 3 Encounters:  12/25/20 115/74  11/26/20 120/67  08/29/20 136/83   Pulse Readings from Last 3 Encounters:  12/25/20 70  11/26/20 68  08/29/20 84   Wt Readings from Last 3 Encounters:  12/25/20 244 lb (110.7 kg)  11/26/20 253 lb (114.8 kg)  08/29/20 274 lb (124.3 kg)    Assessment: Review of patient past medical history, allergies, medications, health status, including review of consultants reports, laboratory and other test data, was performed as part of comprehensive evaluation and provision of chronic care management services.    SDOH:  (Social Determinants of Health) assessments and interventions performed:    CCM Care Plan  Allergies  Allergen Reactions  . Fluticasone Other (See Comments)    Headache (tolerates Advair with no reaction)     Medications Reviewed Today    Reviewed by SLyndal Rainbow CMA (Certified Medical Assistant) on 12/25/20 at [phone] Med List Status: <None>  Medication Order Taking? Sig Documenting Provider Last Dose Status Informant  acetaminophen (TYLENOL) 650 MG CR tablet [id number] Take 650 mg by mouth every 8 (eight) hours as needed for pain. [provider] Taking Active Self  albuterol (VENTOLIN HFA) 108 (90 Base) MCG/ACT inhaler [id number] INHALE 2 PUFFS INTO LUNGS EVERY 4 HOURS AS NEEDED FOR WHEEZING FOR SHORTNESS OF BREATH MHali Marry MD Taking Active   AMBULATORY NON FORMULARY MEDICATION [id number] Take 3 tablets by mouth daily. Medication Name: Life Extension Neuro Magnesium [provider] Taking Active Self  cyclobenzaprine (FLEXERIL) 10 MG tablet [id number] Take 1 tablet (10 mg total) by mouth at bedtime as needed for muscle spasms. MHali Marry MD Taking Active   diclofenac Sodium (VOLTAREN) 1 % GEL [id number] APPLY 2 GRAMS TOPICALLY 4 TIMES DAILY MHali Marry MD Taking Active   Fluticasone-Salmeterol ([facility]) 250-50 MCG/DOSE AEPB [id number] Inhale 1 puff into the lungs in the morning and at bedtime. MHali Marry MD Taking Active   levocetirizine (Harlow Ohms 5 MG tablet [id number] Take 1 tablet (5 mg total) by mouth every evening. MHali Marry MD Taking Active   meloxicam ([facility] 15 MG tablet [id number] TAKE 1 TABLET BY MOUTH ONCE DAILY AS NEEDED FOR PAIN MHali Marry MD Taking Active   montelukast (SINGULAIR) 10 MG tablet [id number] TAKE 1 TABLET BY MOUTH AT BEDTIME MHali Marry MD Taking Active   Omega-3 Fatty Acids (FISH OIL PO) [id number] Take 2,400 mg by  mouth daily. [provider] Taking Active   omeprazole (PRILOSEC OTC) 20 MG tablet [id number] Take 20 mg by mouth daily. [provider] Taking Active Self  Tiotropium Bromide Monohydrate (SPIRIVA RESPIMAT) 1.25 MCG/ACT AERS [id number] Inhale 2 puffs into the lungs daily. MHali Marry MD Taking Active   Turmeric 500 MG TABS [id number] Take 3 tablets by mouth daily. [provider] Taking Active Self          Patient Active Problem List   Diagnosis Date Noted  . Localized swelling of both lower extremities 08/29/2020  . Postmenopause bleeding 07/07/2018  . Dysplasia of cervix, low grade (CIN 1) 07/07/2018  . Family history of coronary artery disease 07/07/2018  . Asthma, severe persistent 07/07/2018  . Severe obesity (BMI >= 40) ([place] 07/07/2018  . Hyperlipidemia 10/30/2015  . Asthma 06/02/2012  . Degenerative arthritis of hip 06/02/2012    Immunization  History  Administered Date(s) Administered  . Fluad Quad(high Dose 65+) 05/23/2019  . Influenza,inj,Quad PF,6+ Mos 08/28/2014, 04/18/2015, 04/22/2016, 07/14/2017, 05/17/2018  . Pneumococcal Polysaccharide-23 11/25/2013  . Tdap 11/06/2015    Conditions to be addressed/monitored: HLD and Pulmonary Disease  There are no care plans that you recently modified to display for this patient.   Medication Assistance: {MEDASSISTANCEINFO:25044}  Patient's preferred pharmacy is:  [place], [place] - [place] [address] [place] [address] Phone: [phone] Fax: [phone]  Uses pill box? {Yes or If no, why not?:20788} Pt endorses ***% compliance  Follow Up:  {FOLLOWUP:24991}  Plan: {CM FOLLOW UP PLAN:25073}  SIG***

## 2021-01-09 ENCOUNTER — Telehealth: Payer: Self-pay | Admitting: Pharmacist

## 2021-01-09 ENCOUNTER — Ambulatory Visit: Payer: Medicare HMO

## 2021-01-09 NOTE — Telephone Encounter (Signed)
Patient called at approximately 8:30am to cancel for 9:00am appointment for [facility] pharmacy services.  Routing to [facility] pharmacy scheduling team for reschedule.  Robin [name]

## 2021-01-14 ENCOUNTER — Other Ambulatory Visit: Payer: Self-pay

## 2021-01-14 ENCOUNTER — Other Ambulatory Visit: Payer: Self-pay | Admitting: Obstetrics & Gynecology

## 2021-01-14 ENCOUNTER — Ambulatory Visit
Admission: RE | Admit: 2021-01-14 | Discharge: 2021-01-14 | Disposition: A | Discharge status: Home / Self Care | Payer: Medicare HMO | Source: Ambulatory Visit | Attending: Obstetrics & Gynecology | Admitting: Obstetrics & Gynecology

## 2021-01-14 DIAGNOSIS — N950 Postmenopausal bleeding: Secondary | ICD-10-CM | POA: Insufficient documentation

## 2021-01-14 DIAGNOSIS — R9389 Abnormal findings on diagnostic imaging of other specified body structures: Secondary | ICD-10-CM | POA: Diagnosis not present

## 2021-01-14 MED ORDER — ALPRAZOLAM 0.5 MG PO TABS: ORAL_TABLET | ORAL | 0 refills | Status: DC

## 2021-01-14 MED ORDER — MISOPROSTOL 200 MCG PO TABS: ORAL_TABLET | ORAL | 0 refills | Status: DC

## 2021-01-14 NOTE — Progress Notes (Signed)
Pt needs endometrial biopsy.  Os is stenotic.  cytotec and xanax prescribed.

## 2021-02-10 ENCOUNTER — Ambulatory Visit (INDEPENDENT_AMBULATORY_CARE_PROVIDER_SITE_OTHER): Payer: Medicare HMO | Admitting: Obstetrics & Gynecology

## 2021-02-10 ENCOUNTER — Encounter: Payer: Self-pay | Admitting: Obstetrics & Gynecology

## 2021-02-10 ENCOUNTER — Other Ambulatory Visit (HOSPITAL_COMMUNITY)
Admission: RE | Admit: 2021-02-10 | Discharge: 2021-02-10 | Disposition: A | Discharge status: Home / Self Care | Payer: Medicare HMO | Source: Ambulatory Visit | Attending: Obstetrics & Gynecology | Admitting: Obstetrics & Gynecology

## 2021-02-10 ENCOUNTER — Other Ambulatory Visit: Payer: Self-pay

## 2021-02-10 VITALS — BP 115/72 | HR 67 | Resp 16 | Ht 61.0 in | Wt 235.0 lb

## 2021-02-10 DIAGNOSIS — N950 Postmenopausal bleeding: Secondary | ICD-10-CM

## 2021-02-10 NOTE — Progress Notes (Signed)
Subjective:    Patient ID: Robin [name], female    DOB: [birth year], 55 y.o.   MRN: [id number]  HPI Pt presents for endometrial biopsy with Xanax and cytotec after failed attempt in May 2022.  CLINICAL DATA:  Postmenopausal bleeding   EXAM: TRANSABDOMINAL AND TRANSVAGINAL ULTRASOUND OF PELVIS   TECHNIQUE: Both transabdominal and transvaginal ultrasound examinations of the pelvis were performed. Transabdominal technique was performed for global imaging of the pelvis including uterus, ovaries, adnexal regions, and pelvic cul-de-sac. It was necessary to proceed with endovaginal exam following the transabdominal exam to visualize the uterus, endometrium, and ovaries.   COMPARISON:  07/05/2018   FINDINGS: Uterus   Measurements: 8.3 x 4.3 x 5.4 cm = volume: 100 mL. Anteverted. Mildly heterogeneous myometrium. No focal mass.   Endometrium   Thickness: 18 mm. Thickened, heterogeneous, abnormal appearance. No discrete mass or endometrial fluid.   Right ovary   Measurements: 2.2 x 1.5 x 1.6 cm = volume: 2.8 mL. Normal morphology without mass   Left ovary   Measurements: 2.6 x 2.3 x 2.4 cm = volume: 7.6 mL. Normal morphology without mass   Other findings   No free pelvic fluid.  No adnexal masses.   IMPRESSION: Abnormal, thickened, heterogeneous endometrial complex measuring 18 mm thick; in the setting of post-menopausal bleeding, endometrial sampling is indicated to exclude carcinoma. If results are benign, sonohysterogram should be considered for focal lesion work-up. (Ref: Radiological Reasoning: Algorithmic Workup of Abnormal Vaginal Bleeding with Endovaginal Sonography and Sonohysterography. AJR 2008; 245:[id number])   These results will be called to the ordering clinician or representative by the Radiologist Assistant, and communication documented in the PACS or [employer].     Electronically Signed   By: Ulyses Southward M.D.   On: 01/14/2021 10:43 Review of  Systems  Constitutional: Negative.   Respiratory: Negative.    Cardiovascular: Negative.   Genitourinary:  Positive for vaginal bleeding.      Objective:   Physical Exam Vitals reviewed.  Constitutional:      General: She is not in acute distress.    Appearance: She is well-developed.  HENT:     Head: Normocephalic and atraumatic.  Eyes:     Conjunctiva/sclera: Conjunctivae normal.  Cardiovascular:     Rate and Rhythm: Normal rate.  Pulmonary:     Effort: Pulmonary effort is normal.  Genitourinary:    Comments: Tanner 5 Small amt of blood in vagina  Skin:    General: Skin is warm and dry.  Neurological:     Mental Status: She is alert and oriented to person, place, and time.  Psychiatric:        Mood and Affect: Mood normal.   Vitals:   02/10/21 1058  BP: 115/72  Pulse: 67  Resp: 16  Weight: 235 lb (106.6 kg)  Height: 5\' 1"  (1.549 m)   Assessment & Plan:  55 yo female for endometrial biopsy  ENDOMETRIAL BIOPSY     The indications for endometrial biopsy were reviewed.   Risks of the biopsy including cramping, bleeding, infection, uterine perforation, inadequate specimen and need for additional procedures  were discussed. The patient states she understands and agrees to undergo procedure today. Consent was signed. Time out was performed. A sterile speculum was placed in the patient's vagina and the cervix was prepped with Betadine. A single-toothed tenaculum was placed on the anterior lip of the cervix to stabilize it. Cervical dilator was used to open os to emit the pipelle.  The 3 mm  pipelle was introduced into the endometrial cavity without difficulty to a depth of 8 cm, and a moderate amount of tissue was obtained and sent to pathology. The instruments were removed from the patient's vagina. Minimal bleeding from the cervix was noted. The patient tolerated the procedure well. Routine post-procedure instructions were given to the patient. The patient will follow up to  review the results and for further management.

## 2021-02-11 DIAGNOSIS — N840 Polyp of corpus uteri: Secondary | ICD-10-CM | POA: Diagnosis not present

## 2021-02-12 LAB — SURGICAL PATHOLOGY

## 2021-02-19 ENCOUNTER — Other Ambulatory Visit: Payer: Self-pay

## 2021-02-19 DIAGNOSIS — N840 Polyp of corpus uteri: Secondary | ICD-10-CM

## 2021-02-19 NOTE — Progress Notes (Signed)
Sonohysterogram order placed per Dr.Leggett

## 2021-03-18 ENCOUNTER — Other Ambulatory Visit: Payer: Self-pay | Admitting: Family Medicine

## 2021-03-18 DIAGNOSIS — M160 Bilateral primary osteoarthritis of hip: Secondary | ICD-10-CM

## 2021-04-04 ENCOUNTER — Other Ambulatory Visit: Payer: Self-pay

## 2021-04-04 ENCOUNTER — Ambulatory Visit (INDEPENDENT_AMBULATORY_CARE_PROVIDER_SITE_OTHER): Payer: Medicare HMO | Admitting: Sports Medicine

## 2021-04-04 VITALS — BP 127/62 | HR 69 | Ht 61.0 in | Wt 234.0 lb

## 2021-04-04 DIAGNOSIS — Z0000 Encounter for general adult medical examination without abnormal findings: Secondary | ICD-10-CM | POA: Diagnosis not present

## 2021-04-04 DIAGNOSIS — J449 Chronic obstructive pulmonary disease, unspecified: Secondary | ICD-10-CM | POA: Diagnosis not present

## 2021-04-04 NOTE — Progress Notes (Signed)
MEDICARE ANNUAL WELLNESS VISIT  04/04/2021  Subjective:  Robin [name] is a 55 y.o. female patient of Metheney, Barbarann Ehlers, MD who had a Medicare Annual Wellness Visit today. Robin [name] is Disabled and lives with her sister and great niece. she has 1 child. she reports that she is socially active and does interact with friends/family regularly. she is minimally physically active and enjoys gardening.  Patient Care Team: Agapito Games, MD as PCP - General (Family Medicine) Gabriel Carina, [facility] (Pharmacist) Gabriel Carina, [facility] as Pharmacist (Pharmacist)  Advanced Directives 04/04/2021 05/23/2019 08/02/2018 05/17/2018 02/05/2014 01/29/2014 01/16/2014  Does Patient Have a Medical Advance Directive? No No No No Patient does not have advance directive;Patient would not like information Patient does not have advance directive;Patient would not like information Patient does not have advance directive;Patient would not like information  Would patient like information on creating a medical advance directive? No - Patient declined No - Patient declined No - Patient declined Yes (MAU/Ambulatory/Procedural Areas - Information given) - - -  Pre-existing out of facility DNR order (yellow form or pink MOST form) - - - - No - -    Hospital Utilization Over the Past 12 Months: # of hospitalizations or ER visits: 0 # of surgeries: 0  Review of Systems    Patient reports that her overall health is better when compared to last year.  Review of Systems: History obtained from chart review and the patient  All other systems negative.  Pain Assessment Pain : 0-10 Pain Score: 5  Pain Type: Chronic pain Pain Location: Leg Pain Orientation: Right, Left Pain Descriptors / Indicators: Aching Pain Onset: More than a month ago Pain Frequency: Constant Pain Relieving Factors: Rest  Pain Relieving Factors: Rest  Current Medications & Allergies (verified) Allergies as of 04/04/2021       Reactions    Fluticasone Other (See Comments)   Headache (tolerates Advair with no reaction)        Medication List        Accurate as of April 04, 2021 10:48 AM. If you have any questions, ask your nurse or doctor.          acetaminophen 650 MG CR tablet Commonly known as: TYLENOL Take 650 mg by mouth every 8 (eight) hours as needed for pain.   albuterol 108 (90 Base) MCG/ACT inhaler Commonly known as: VENTOLIN HFA INHALE 2 PUFFS INTO LUNGS EVERY 4 HOURS AS NEEDED FOR WHEEZING FOR SHORTNESS OF BREATH   ALPRAZolam 0.5 MG tablet Commonly known as: Xanax Take one tablet one hour before procedure.  Take the 2nd pill if needed upon arriving at office.   AMBULATORY NON FORMULARY MEDICATION Take 3 tablets by mouth daily. Medication Name: Life Extension Neuro Magnesium   cyclobenzaprine 10 MG tablet Commonly known as: FLEXERIL Take 1 tablet (10 mg total) by mouth at bedtime as needed for muscle spasms.   diclofenac Sodium 1 % Gel Commonly known as: VOLTAREN APPLY 2 GRAMS TOPICALLY 4 TIMES DAILY   FISH OIL PO Take 2,400 mg by mouth daily.   levocetirizine 5 MG tablet Commonly known as: XYZAL Take 1 tablet (5 mg total) by mouth every evening.   Magnesium 200 MG Tabs Take by mouth. Three times a day.   meloxicam 15 MG tablet Commonly known as: MOBIC TAKE 1 TABLET BY MOUTH ONCE DAILY AS NEEDED FOR PAIN   misoprostol 200 MCG tablet Commonly known as: CYTOTEC Take orally 4-6 hours before procedure.   montelukast 10 MG  tablet Commonly known as: SINGULAIR TAKE 1 TABLET BY MOUTH AT BEDTIME   omeprazole 20 MG tablet Commonly known as: PRILOSEC OTC Take 20 mg by mouth daily.   Trelegy Ellipta 100-62.5-25 MCG/INH Aepb Generic drug: Fluticasone-Umeclidin-Vilant Inhale 1 puff into the lungs daily.   Turmeric 500 MG Tabs Take 3 tablets by mouth daily.        History (reviewed): Past Medical History:  Diagnosis Date   Arthritis    Asthma    Flonase,Singulair,Advair daily    Convulsion (HCC)    as a baby    COPD (chronic obstructive pulmonary disease) (HCC)    uses Albuterol daily as needed   Dysphagia    GERD (gastroesophageal reflux disease)    History of bronchitis    last time 69yrs ago   History of kidney stones    Hyperlipidemia    borderline no meds required   Joint pain    Joint swelling    Shortness of breath    lying/sitting/exertion   Uterine polyp 2022   Dr. Penne Lash   Past Surgical History:  Procedure Laterality Date   CHOLECYSTECTOMY  2002   DILATATION & CURETTAGE/HYSTEROSCOPY WITH MYOSURE N/A 08/05/2018   Procedure: DILATATION & CURETTAGE/HYSTEROSCOPY WITH MYOSURE;  Surgeon: Lesly Dukes, MD;  Location: [facility];  Service: Gynecology;  Laterality: N/A;   ERCP     LEEP N/A 08/05/2018   Procedure: LOOP ELECTROSURGICAL EXCISION PROCEDURE (LEEP);  Surgeon: Lesly Dukes, MD;  Location: [facility];  Service: Gynecology;  Laterality: N/A;   LUMBAR LAMINECTOMY  1994   L4-5   TOTAL HIP ARTHROPLASTY Left 11/24/2013   Procedure: LEFT TOTAL HIP ARTHROPLASTY;  Surgeon: Nestor Lewandowsky, MD;  Location: [facility];  Service: Orthopedics;  Laterality: Left;   TOTAL HIP ARTHROPLASTY Right 02/05/2014   Procedure: RIGHT TOTAL HIP ARTHROPLASTY;  Surgeon: Nestor Lewandowsky, MD;  Location: [facility] OR;  Service: Orthopedics;  Laterality: Right;   Family History  Problem Relation Age of Onset   Alcohol abuse Brother    Alcohol abuse Sister    Diabetes Sister    Heart attack Father    Diabetes Other        grandparents.    Breast cancer Maternal Aunt        post menopausal   Social History   Socioeconomic History   Marital status: Widowed    Spouse name: Not on file   Number of children: 1   Years of education: 61   Highest education level: 12th grade  Occupational History   Occupation: Programmer, applications   Occupation: Disabled  Tobacco Use   Smoking status: Former    Packs/day: 1.00    Years: 25.00    Pack years: 25.00    Types: Cigarettes    Quit date:  01/30/2004    Years since quitting: 17.1   Smokeless tobacco: Never   Tobacco comments:    quit 8-33yrs ago  Vaping Use   Vaping Use: Never used  Substance and Sexual Activity   Alcohol use: No   Drug use: No   Sexual activity: Not Currently    Birth control/protection: None  Other Topics Concern   Not on file  Social History Narrative   Lives with her sister and great niece. She has one child. She enjoys gardening.    Social Determinants of Health   Financial Resource Strain: High Risk   Difficulty of Paying Living Expenses: Hard  Food Insecurity: No Food Insecurity   Worried About Running  Out of Food in the Last Year: Never true   Ran Out of Food in the Last Year: Never true  Transportation Needs: No Transportation Needs   Lack of Transportation (Medical): No   Lack of Transportation (Non-Medical): No  Physical Activity: Inactive   Days of Exercise per Week: 0 days   Minutes of Exercise per Session: 0 min  Stress: Stress Concern Present   Feeling of Stress : To some extent  Social Connections: Moderately Integrated   Frequency of Communication with Friends and Family: Never   Frequency of Social Gatherings with Friends and Family: More than three times a week   Attends Religious Services: More than 4 times per year   Active Member of [employer] or Organizations: Yes   Attends Banker Meetings: More than 4 times per year   Marital Status: Widowed    Activities of Daily Living In your present state of health, do you have any difficulty performing the following activities: 04/04/2021  Hearing? Y  Comment some difficulty  Vision? Y  Comment she feels she needs new glasses.  Difficulty concentrating or making decisions? N  Walking or climbing stairs? Y  Comment due to pain.  Dressing or bathing? Y  Comment She is unable to put her socks on.  Doing errands, shopping? N  Preparing Food and eating ? N  Using the Toilet? N  In the past six months, have you  accidently leaked urine? N  Do you have problems with loss of bowel control? N  Managing your Medications? N  Managing your Finances? N  Housekeeping or managing your Housekeeping? Y  Comment Her sister helps her with that.  Some recent data might be hidden    Patient Education/Literacy How often do you need to have someone help you when you read instructions, pamphlets, or other written materials from your doctor or pharmacy?: 1 - Never What is the last grade level you completed in school?: 12th grade.  Exercise Current Exercise Habits: Home exercise routine, Type of exercise: Other - see comments (gardening), Time (Minutes): > 60, Frequency (Times/Week): >7, Weekly Exercise (Minutes/Week): 0, Intensity: Mild, Exercise limited by: respiratory conditions(s);orthopedic condition(s)  Diet Patient reports consuming 2 meals a day and 0-1 snack(s) a day Patient reports that her primary diet is: Regular Patient reports that she does have regular access to food.   Depression Screen PHQ 2/9 Scores 04/04/2021 11/26/2020 08/29/2020 05/23/2019 04/10/2019 04/10/2019 05/17/2018  PHQ - 2 Score 1 0 0 1 0 0 0  PHQ- 9 Score - - - 8 3 - -     Fall Risk Fall Risk  04/04/2021 11/26/2020 05/23/2019 04/10/2019 05/17/2018  Falls in the past year? 1 1 1 1  Yes  Comment - - - - fell in garden. Seen doctor had a sprain ankle  Number falls in past yr: 0 0 0 0 1  Injury with Fall? 0 0 0 0 Yes  Comment - - - - ankle sprain  Risk for fall due to : History of fall(s);Impaired balance/gait No Fall Risks History of fall(s) Other (Comment) -  Follow up Falls evaluation completed;Education provided;Falls prevention discussed - Falls prevention discussed Falls prevention discussed Falls prevention discussed     Objective:   BP 127/62 (BP Location: Right Arm, Patient Position: Sitting, Cuff Size: Large)   Pulse 69   Ht 5\' 1"  (1.549 m)   Wt 234 lb (106.1 kg)   LMP 05/24/2018   SpO2 97%   BMI 44.21 kg/m  Last Weight  Most  recent update: 04/04/2021 10:11 AM    Weight  106.1 kg (234 lb)             Body mass index is 44.21 kg/m.  Hearing/Vision  Robin [name] did not have difficulty with hearing/understanding during the face-to-face interview Robin [name] did not have difficulty with her vision during the face-to-face interview Reports that she has not had a formal eye exam by an eye care professional within the past year Reports that she has not had a formal hearing evaluation within the past year  Cognitive Function: 6CIT Screen 04/04/2021 05/23/2019 05/17/2018  What Year? 0 points 0 points 0 points  What month? 0 points 0 points 0 points  What time? 0 points 0 points 0 points  Count back from 20 0 points 0 points 0 points  Months in reverse 0 points 0 points 0 points  Repeat phrase 0 points 0 points 2 points  Total Score 0 0 2    Normal Cognitive Function Screening: Yes (Normal:0-7, Significant for Dysfunction: >8)  Immunization & Health Maintenance Record Immunization History  Administered Date(s) Administered   Fluad Quad(high Dose 65+) 05/23/2019   Influenza,inj,Quad PF,6+ Mos 08/28/2014, 04/18/2015, 04/22/2016, 07/14/2017, 05/17/2018   Pneumococcal Polysaccharide-23 11/25/2013   Tdap 11/06/2015    Health Maintenance  Topic Date Due   INFLUENZA VACCINE  05/21/2021 (Originally 03/24/2021)   Zoster Vaccines- Shingrix (1 of 2) 07/05/2021 (Originally 03/16/2014)   COVID-19 Vaccine (1) 08/29/2021 (Originally 03/16/1971)   COLON CANCER SCREENING ANNUAL FOBT  10/05/2021 (Originally 03/16/2009)   COLONOSCOPY (Pts 45-59yrs Insurance coverage will need to be confirmed)  04/04/2022 (Originally 03/16/2009)   MAMMOGRAM  12/06/2022   PAP SMEAR-Modifier  12/26/2023   TETANUS/TDAP  11/05/2025   Hepatitis C Screening  Completed   HIV Screening  Completed   Pneumococcal Vaccine 57-67 Years old  Aged Out   HPV VACCINES  Aged Out       Assessment  This is a routine wellness examination for [employer].  Health  Maintenance: Due or Overdue There are no preventive care reminders to display for this patient.   Robin [name] does not need a referral for [employer] Assistance: Care Management:   no Social Work:    no Prescription Assistance:  no Nutrition/Diabetes Education:  no   Plan:  Personalized Goals  Goals Addressed               This Visit's Progress     Patient Stated (pt-stated)        Would like to be able to put on her socks and have better balance.       Personalized Health Maintenance & Screening Recommendations  Colorectal cancer screening Shingles vaccine Covid Vaccine Flu vaccine  Patient declined the vaccines at this time and the referral for the colonoscopy.  Lung Cancer Screening Recommended: no (Low Dose CT Chest recommended if Age 29-80 years, 30 pack-year currently smoking OR have quit w/in past 15 years) Hepatitis C Screening recommended: no HIV Screening recommended: no  Advanced Directives: Written information was not given per the patient's request.  Referrals & Orders No orders of the defined types were placed in this encounter.   Follow-up Plan Follow-up with Agapito Games, MD as planned Please let us know if you change your mind about the colonoscopy. Medicare wellness vist in one year. AVS printed and given to the patient.   I have personally reviewed and noted the following in the patient's chart:   Medical and  social history Use of alcohol, tobacco or illicit drugs  Current medications and supplements Functional ability and status Nutritional status Physical activity Advanced directives List of other physicians Hospitalizations, surgeries, and ER visits in previous 12 months Vitals Screenings to include cognitive, depression, and falls Referrals and appointments  In addition, I have reviewed and discussed with patient certain preventive protocols, quality metrics, and best practice recommendations. A written personalized  care plan for preventive services as well as general preventive health recommendations were provided to patient.     [place] CharonBableen  Galilee Pierron, RN  04/04/2021

## 2021-04-04 NOTE — Patient Instructions (Addendum)
MEDICARE ANNUAL WELLNESS VISIT Health Maintenance Summary and Written Plan of Care  Ms. Symons ,  Thank you for allowing me to perform your Medicare Annual Wellness Visit and for your ongoing commitment to your health.   Health Maintenance & Immunization History Health Maintenance  Topic Date Due   INFLUENZA VACCINE  05/21/2021 (Originally 03/24/2021)   Zoster Vaccines- Shingrix (1 of 2) 07/05/2021 (Originally 03/16/2014)   COVID-19 Vaccine (1) 08/29/2021 (Originally 03/16/1971)   COLON CANCER SCREENING ANNUAL FOBT  10/05/2021 (Originally 03/16/2009)   COLONOSCOPY (Pts 45-52yrs Insurance coverage will need to be confirmed)  04/04/2022 (Originally 03/16/2009)   MAMMOGRAM  12/06/2022   PAP SMEAR-Modifier  12/26/2023   TETANUS/TDAP  11/05/2025   Hepatitis C Screening  Completed   HIV Screening  Completed   Pneumococcal Vaccine 4-97 Years old  Aged Out   HPV VACCINES  Aged Out   Immunization History  Administered Date(s) Administered   Fluad Quad(high Dose 65+) 05/23/2019   Influenza,inj,Quad PF,6+ Mos 08/28/2014, 04/18/2015, 04/22/2016, 07/14/2017, 05/17/2018   Pneumococcal Polysaccharide-23 11/25/2013   Tdap 11/06/2015    These are the patient goals that we discussed:  Goals Addressed               This Visit's Progress     Patient Stated (pt-stated)        Would like to be able to put on her socks and have better balance.         This is a list of Health Maintenance Items that are overdue or due now: Colorectal cancer screening Shingles vaccine Covid Vaccine Flu vaccine   Orders/Referrals Placed Today: No orders of the defined types were placed in this encounter.  (Contact our referral department at [phone] if you have not spoken with someone about your referral appointment within the next 5 days)    Follow-up Plan Follow-up with Agapito Games, MD as planned Please let us know if you change your mind about the colonoscopy. Medicare wellness vist in  one year. AVS printed and given to the patient.      Colonoscopy, Adult A colonoscopy is a procedure to look at the entire large intestine. This procedure is done using a long, thin, flexible tube that has a camera on theend. You may have a colonoscopy: As a part of normal colorectal screening. If you have certain symptoms, such as: A low number of red blood cells in your blood (anemia). Diarrhea that does not go away. Pain in your abdomen. Blood in your stool. A colonoscopy can help screen for and diagnose medical problems, including: Tumors. Extra tissue that grows where mucus forms (polyps). Inflammation. Areas of bleeding. Tell your health care provider about: Any allergies you have. All medicines you are taking, including vitamins, herbs, eye drops, creams, and over-the-counter medicines. Any problems you or family members have had with anesthetic medicines. Any blood disorders you have. Any surgeries you have had. Any medical conditions you have. Any problems you have had with having bowel movements. Whether you are pregnant or may be pregnant. What are the risks? Generally, this is a safe procedure. However, problems may occur, including: Bleeding. Damage to your intestine. Allergic reactions to medicines given during the procedure. Infection. This is rare. What happens before the procedure? Eating and drinking restrictions Follow instructions from your health care provider about eating or drinking restrictions, which may include: A few days before the procedure: Follow a low-fiber diet. Avoid nuts, seeds, dried fruit, raw fruits, and vegetables. 1-3 days before  the procedure: Eat only gelatin dessert or ice pops. Drink only clear liquids, such as water, clear juice, clear broth or bouillon, black coffee or tea, or clear soft drinks or sports drinks. Avoid liquids that contain red or purple dye. The day of the procedure: Do not eat solid foods. You may continue to  drink clear liquids until up to 2 hours before the procedure. Do not eat or drink anything starting 2 hours before the procedure, or within the time period that your health care provider recommends. Bowel prep If you were prescribed a bowel prep to take by mouth (orally) to clean out your colon: Take it as told by your health care provider. Starting the day before your procedure, you will need to drink a large amount of liquid medicine. The liquid will cause you to have many bowel movements of loose stool until your stool becomes almost clear or light green. If your skin or the opening between the buttocks (anus) gets irritated from diarrhea, you may relieve the irritation using: Wipes with medicine in them, such as adult wet wipes with aloe and vitamin E. A product to soothe skin, such as petroleum jelly. If you vomit while drinking the bowel prep: Take a break for up to 60 minutes. Begin the bowel prep again. Call your health care provider if you keep vomiting or you cannot take the bowel prep without vomiting. To clean out your colon, you may also be given: Laxative medicines. These help you have a bowel movement. Instructions for enema use. An enema is liquid medicine injected into your rectum. Medicines Ask your health care provider about: Changing or stopping your regular medicines or supplements. This is especially important if you are taking iron supplements, diabetes medicines, or blood thinners. Taking medicines such as aspirin and ibuprofen. These medicines can thin your blood. Do not take these medicines unless your health care provider tells you to take them. Taking over-the-counter medicines, vitamins, herbs, and supplements. General instructions Ask your health care provider what steps will be taken to help prevent infection. These may include washing skin with a germ-killing soap. Plan to have someone take you home from the hospital or clinic. What happens during the  procedure?  An IV will be inserted into one of your veins. You may be given one or more of the following: A medicine to help you relax (sedative). A medicine to numb the area (local anesthetic). A medicine to make you fall asleep (general anesthetic). This is rarely needed. You will lie on your side with your knees bent. The tube will: Have oil or gel put on it (be lubricated). Be inserted into your anus. Be gently eased through all parts of your large intestine. Air will be sent into your colon to keep it open. This may cause some pressure or cramping. Images will be taken with the camera and will appear on a screen. A small tissue sample may be removed to be looked at under a microscope (biopsy). The tissue may be sent to a lab for testing if any signs of problems are found. If small polyps are found, they may be removed and checked for cancer cells. When the procedure is finished, the tube will be removed. The procedure may vary among health care providers and hospitals. What happens after the procedure? Your blood pressure, heart rate, breathing rate, and blood oxygen level will be monitored until you leave the hospital or clinic. You may have a small amount of blood in your stool.  You may pass gas and have mild cramping or bloating in your abdomen. This is caused by the air that was used to open your colon during the exam. Do not drive for 24 hours after the procedure. It is up to you to get the results of your procedure. Ask your health care provider, or the department that is doing the procedure, when your results will be ready. Summary A colonoscopy is a procedure to look at the entire large intestine. Follow instructions from your health care provider about eating and drinking before the procedure. If you were prescribed an oral bowel prep to clean out your colon, take it as told by your health care provider. During the colonoscopy, a flexible tube with a camera on its end is  inserted into the anus and then passed into the other parts of the large intestine. This information is not intended to replace advice given to you by your health care provider. Make sure you discuss any questions you have with your healthcare provider. Document Revised: 03/03/2019 Document Reviewed: 03/03/2019 Elsevier Patient Education  2022 Elsevier Inc.  Health Maintenance, Female Adopting a healthy lifestyle and getting preventive care are important in promoting health and wellness. Ask your health care provider about: The right schedule for you to have regular tests and exams. Things you can do on your own to prevent diseases and keep yourself healthy. What should I know about diet, weight, and exercise? Eat a healthy diet  Eat a diet that includes plenty of vegetables, fruits, low-fat dairy products, and lean protein. Do not eat a lot of foods that are high in solid fats, added sugars, or sodium.  Maintain a healthy weight Body mass index (BMI) is used to identify weight problems. It estimates body fat based on height and weight. Your health care provider can help determineyour BMI and help you achieve or maintain a healthy weight. Get regular exercise Get regular exercise. This is one of the most important things you can do for your health. Most adults should: Exercise for at least 150 minutes each week. The exercise should increase your heart rate and make you sweat (moderate-intensity exercise). Do strengthening exercises at least twice a week. This is in addition to the moderate-intensity exercise. Spend less time sitting. Even light physical activity can be beneficial. Watch cholesterol and blood lipids Have your blood tested for lipids and cholesterol at 57 years of age, then havethis test every 5 years. Have your cholesterol levels checked more often if: Your lipid or cholesterol levels are high. You are older than 57 years of age. You are at high risk for heart  disease. What should I know about cancer screening? Depending on your health history and family history, you may need to have cancer screening at various ages. This may include screening for: Breast cancer. Cervical cancer. Colorectal cancer. Skin cancer. Lung cancer. What should I know about heart disease, diabetes, and high blood pressure? Blood pressure and heart disease High blood pressure causes heart disease and increases the risk of stroke. This is more likely to develop in people who have high blood pressure readings, are of African descent, or are overweight. Have your blood pressure checked: Every 3-5 years if you are 69-41 years of age. Every year if you are 62 years old or older. Diabetes Have regular diabetes screenings. This checks your fasting blood sugar level. Have the screening done: Once every three years after age 16 if you are at a normal weight and have a  low risk for diabetes. More often and at a younger age if you are overweight or have a high risk for diabetes. What should I know about preventing infection? Hepatitis B If you have a higher risk for hepatitis B, you should be screened for this virus. Talk with your health care provider to find out if you are at risk forhepatitis B infection. Hepatitis C Testing is recommended for: Everyone born from [birth year] through 1967. Anyone with known risk factors for hepatitis C. Sexually transmitted infections (STIs) Get screened for STIs, including gonorrhea and chlamydia, if: You are sexually active and are younger than 57 years of age. You are older than 57 years of age and your health care provider tells you that you are at risk for this type of infection. Your sexual activity has changed since you were last screened, and you are at increased risk for chlamydia or gonorrhea. Ask your health care provider if you are at risk. Ask your health care provider about whether you are at high risk for HIV. Your health care provider  may recommend a prescription medicine to help prevent HIV infection. If you choose to take medicine to prevent HIV, you should first get tested for HIV. You should then be tested every 3 months for as long as you are taking the medicine. Pregnancy If you are about to stop having your period (premenopausal) and you may become pregnant, seek counseling before you get pregnant. Take 400 to 800 micrograms (mcg) of folic acid every day if you become pregnant. Ask for birth control (contraception) if you want to prevent pregnancy. Osteoporosis and menopause Osteoporosis is a disease in which the bones lose minerals and strength with aging. This can result in bone fractures. If you are 33 years old or older, or if you are at risk for osteoporosis and fractures, ask your health care provider if you should: Be screened for bone loss. Take a calcium or vitamin D supplement to lower your risk of fractures. Be given hormone replacement therapy (HRT) to treat symptoms of menopause. Follow these instructions at home: Lifestyle Do not use any products that contain nicotine or tobacco, such as cigarettes, e-cigarettes, and chewing tobacco. If you need help quitting, ask your health care provider. Do not use street drugs. Do not share needles. Ask your health care provider for help if you need support or information about quitting drugs. Alcohol use Do not drink alcohol if: Your health care provider tells you not to drink. You are pregnant, may be pregnant, or are planning to become pregnant. If you drink alcohol: Limit how much you use to 0-1 drink a day. Limit intake if you are breastfeeding. Be aware of how much alcohol is in your drink. In the [place] one drink equals one 12 oz bottle of beer (355 mL), one 5 oz glass of wine (148 mL), or one 1 oz glass of hard liquor (44 mL). General instructions Schedule regular health, dental, and eye exams. Stay current with your vaccines. Tell your health care  provider if: You often feel depressed. You have ever been abused or do not feel safe at home. Summary Adopting a healthy lifestyle and getting preventive care are important in promoting health and wellness. Follow your health care provider's instructions about healthy diet, exercising, and getting tested or screened for diseases. Follow your health care provider's instructions on monitoring your cholesterol and blood pressure. This information is not intended to replace advice given to you by your health care provider. Make  sure you discuss any questions you have with your healthcare provider. Document Revised: 08/03/2018 Document Reviewed: 08/03/2018 Elsevier Patient Education  2022 [employer].

## 2021-04-21 ENCOUNTER — Other Ambulatory Visit: Payer: Self-pay | Admitting: Family Medicine

## 2021-05-29 ENCOUNTER — Ambulatory Visit (INDEPENDENT_AMBULATORY_CARE_PROVIDER_SITE_OTHER): Payer: Medicare HMO | Admitting: Family Medicine

## 2021-05-29 ENCOUNTER — Encounter: Payer: Self-pay | Admitting: Family Medicine

## 2021-05-29 VITALS — BP 110/74 | HR 70 | Ht 61.0 in | Wt 240.0 lb

## 2021-05-29 DIAGNOSIS — E7849 Other hyperlipidemia: Secondary | ICD-10-CM

## 2021-05-29 DIAGNOSIS — J4550 Severe persistent asthma, uncomplicated: Secondary | ICD-10-CM

## 2021-05-29 DIAGNOSIS — J449 Chronic obstructive pulmonary disease, unspecified: Secondary | ICD-10-CM | POA: Diagnosis not present

## 2021-05-29 DIAGNOSIS — R7309 Other abnormal glucose: Secondary | ICD-10-CM

## 2021-05-29 DIAGNOSIS — Z13220 Encounter for screening for lipoid disorders: Secondary | ICD-10-CM

## 2021-05-29 DIAGNOSIS — M7989 Other specified soft tissue disorders: Secondary | ICD-10-CM | POA: Diagnosis not present

## 2021-05-29 NOTE — Assessment & Plan Note (Signed)
Seems to be pretty good today.  If it starts to increase again then we can always consider adding a diuretic continue to work on low-salt diet

## 2021-05-29 NOTE — Assessment & Plan Note (Signed)
I struggles in the fall and through the wintertime she is pretty much on an antihistamine, Singulair and Trelegy.  Unfortunately she is in her donut hole so the price of the Trelegy went up significantly did encourage her to check with the manufacturer and apply for patient assistance to see if she would qualify

## 2021-05-29 NOTE — Progress Notes (Signed)
Established Patient Office Visit  Subjective:  Patient ID: Robin [name], female    DOB: 1965/08/30  Age: 55 y.o. MRN: [id number]  CC:  Chief Complaint  Patient presents with   Asthma    HPI Robin [name] presents for   F/U Asthma -she says following winter is her rough time a year she is taking her antihistamine, Singulair and Trelegy she unfortunately has hit her Medicare gap so the price of her Trelegy went up significantly  She feels like her lower extremity swelling is improved.  When I saw her in January she was having some significant lower extremity swelling we did labs just to rule out heart failure proteinuria etc.  She does take meloxicam which can certainly contribute.  But overall she feels like it is better.  Past Medical History:  Diagnosis Date   Arthritis    Asthma    Flonase,Singulair,Advair daily   Convulsion (HCC)    as a baby    COPD (chronic obstructive pulmonary disease) (HCC)    uses Albuterol daily as needed   Dysphagia    GERD (gastroesophageal reflux disease)    History of bronchitis    last time 39yrs ago   History of kidney stones    Hyperlipidemia    borderline no meds required   Joint pain    Joint swelling    Shortness of breath    lying/sitting/exertion   Uterine polyp 2022   Dr. Penne Lash    Past Surgical History:  Procedure Laterality Date   CHOLECYSTECTOMY  2002   DILATATION & CURETTAGE/HYSTEROSCOPY WITH MYOSURE N/A 08/05/2018   Procedure: DILATATION & CURETTAGE/HYSTEROSCOPY WITH MYOSURE;  Surgeon: Lesly Dukes, MD;  Location: [facility];  Service: Gynecology;  Laterality: N/A;   ERCP     LEEP N/A 08/05/2018   Procedure: LOOP ELECTROSURGICAL EXCISION PROCEDURE (LEEP);  Surgeon: Lesly Dukes, MD;  Location: [facility];  Service: Gynecology;  Laterality: N/A;   LUMBAR LAMINECTOMY  1994   L4-5   TOTAL HIP ARTHROPLASTY Left 11/24/2013   Procedure: LEFT TOTAL HIP ARTHROPLASTY;  Surgeon: Nestor Lewandowsky, MD;  Location: [facility];  Service:  Orthopedics;  Laterality: Left;   TOTAL HIP ARTHROPLASTY Right 02/05/2014   Procedure: RIGHT TOTAL HIP ARTHROPLASTY;  Surgeon: Nestor Lewandowsky, MD;  Location: [facility];  Service: Orthopedics;  Laterality: Right;    Family History  Problem Relation Age of Onset   Alcohol abuse Brother    Alcohol abuse Sister    Diabetes Sister    Heart attack Father    Diabetes Other        grandparents.    Breast cancer Maternal Aunt        post menopausal    Social History   Socioeconomic History   Marital status: Widowed    Spouse name: Not on file   Number of children: 1   Years of education: 40   Highest education level: 12th grade  Occupational History   Occupation: Programmer, applications   Occupation: Disabled  Tobacco Use   Smoking status: Former    Packs/day: 1.00    Years: 25.00    Pack years: 25.00    Types: Cigarettes    Quit date: 01/30/2004    Years since quitting: 17.3   Smokeless tobacco: Never   Tobacco comments:    quit 8-64yrs ago  Vaping Use   Vaping Use: Never used  Substance and Sexual Activity   Alcohol use: No   Drug use: No  Sexual activity: Not Currently    Birth control/protection: None  Other Topics Concern   Not on file  Social History Narrative   Lives with her sister and great niece. She has one child. She enjoys gardening.    Social Determinants of Health   Financial Resource Strain: High Risk   Difficulty of Paying Living Expenses: Hard  Food Insecurity: No Food Insecurity   Worried About Programme researcher, broadcasting/film/video in the Last Year: Never true   Ran Out of Food in the Last Year: Never true  Transportation Needs: No Transportation Needs   Lack of Transportation (Medical): No   Lack of Transportation (Non-Medical): No  Physical Activity: Inactive   Days of Exercise per Week: 0 days   Minutes of Exercise per Session: 0 min  Stress: Stress Concern Present   Feeling of Stress : To some extent  Social Connections: Moderately Integrated   Frequency of Communication  with Friends and Family: Never   Frequency of Social Gatherings with Friends and Family: More than three times a week   Attends Religious Services: More than 4 times per year   Active Member of [employer] or Organizations: Yes   Attends Banker Meetings: More than 4 times per year   Marital Status: Widowed  Catering manager Violence: Not At Risk   Fear of Current or Ex-Partner: No   Emotionally Abused: No   Physically Abused: No   Sexually Abused: No    Outpatient Medications Prior to Visit  Medication Sig Dispense Refill   acetaminophen (TYLENOL) 650 MG CR tablet Take 650 mg by mouth every 8 (eight) hours as needed for pain.     albuterol (VENTOLIN HFA) 108 (90 Base) MCG/ACT inhaler INHALE 2 PUFFS INTO LUNGS EVERY 4 HOURS AS NEEDED FOR WHEEZING FOR SHORTNESS OF BREATH 18 g 6   Coenzyme Q10 (CO Q-10) 100 MG CAPS Take 1 capsule by mouth daily.     cyclobenzaprine (FLEXERIL) 10 MG tablet Take 1 tablet (10 mg total) by mouth at bedtime as needed for muscle spasms. 20 tablet 1   diclofenac Sodium (VOLTAREN) 1 % GEL APPLY 2 GRAMS TOPICALLY 4 TIMES DAILY 200 g 11   Fluticasone-Umeclidin-Vilant (TRELEGY ELLIPTA) 100-62.5-25 MCG/INH AEPB Inhale 1 puff into the lungs daily. 1 each 5   levocetirizine (XYZAL) 5 MG tablet Take 1 tablet (5 mg total) by mouth every evening. 90 tablet 3   Magnesium 200 MG TABS Take by mouth. Three times a day.     meloxicam (MOBIC) 15 MG tablet TAKE 1 TABLET BY MOUTH ONCE DAILY AS NEEDED FOR PAIN 90 tablet 0   montelukast (SINGULAIR) 10 MG tablet TAKE 1 TABLET BY MOUTH AT BEDTIME 90 tablet 0   Omega-3 Fatty Acids (FISH OIL PO) Take 2,400 mg by mouth daily.     omeprazole (PRILOSEC OTC) 20 MG tablet Take 20 mg by mouth daily.     Turmeric 500 MG TABS Take 3 tablets by mouth daily.     ALPRAZolam (XANAX) 0.5 MG tablet Take one tablet one hour before procedure.  Take the 2nd pill if needed upon arriving at office. (Patient not taking: Reported on 04/04/2021) 2  tablet 0   AMBULATORY NON FORMULARY MEDICATION Take 3 tablets by mouth daily. Medication Name: Life Extension Neuro Magnesium     misoprostol (CYTOTEC) 200 MCG tablet Take orally 4-6 hours before procedure. (Patient not taking: Reported on 04/04/2021) 2 tablet 0   No facility-administered medications prior to visit.    Allergies  Allergen Reactions   Fluticasone Other (See Comments)    Headache (tolerates Advair with no reaction)     ROS Review of Systems    Objective:    Physical Exam Constitutional:      Appearance: Normal appearance. She is well-developed.  HENT:     Head: Normocephalic and atraumatic.  Cardiovascular:     Rate and Rhythm: Normal rate and regular rhythm.     Heart sounds: Normal heart sounds.  Pulmonary:     Effort: Pulmonary effort is normal.     Breath sounds: Normal breath sounds.  Skin:    General: Skin is warm and dry.  Neurological:     Mental Status: She is alert and oriented to person, place, and time.  Psychiatric:        Behavior: Behavior normal.    BP 110/74   Pulse 70   Ht 5\' 1"  (1.549 m)   Wt 240 lb (108.9 kg)   LMP 05/24/2018   SpO2 95%   BMI 45.35 kg/m  Wt Readings from Last 3 Encounters:  05/29/21 240 lb (108.9 kg)  04/04/21 234 lb (106.1 kg)  02/10/21 235 lb (106.6 kg)     There are no preventive care reminders to display for this patient.   There are no preventive care reminders to display for this patient.  No results found for: TSH Lab Results  Component Value Date   WBC 7.8 08/02/2018   HGB 14.7 08/02/2018   HCT 48.6 (H) 08/02/2018   MCV 88.7 08/02/2018   PLT 345 08/02/2018   Lab Results  Component Value Date   NA 140 08/02/2018   K 4.2 08/02/2018   CO2 24 08/02/2018   GLUCOSE 102 (H) 08/02/2018   BUN 14 08/02/2018   CREATININE 0.82 08/02/2018   BILITOT 0.3 11/04/2017   ALKPHOS 81 10/22/2015   AST 15 11/04/2017   ALT 15 11/04/2017   PROT 6.3 11/04/2017   ALBUMIN 4.0 10/22/2015   CALCIUM 9.4  08/02/2018   ANIONGAP 8 08/02/2018   Lab Results  Component Value Date   CHOL 193 11/04/2017   Lab Results  Component Value Date   HDL 49 (L) 11/04/2017   Lab Results  Component Value Date   LDLCALC 119 (H) 11/04/2017   Lab Results  Component Value Date   TRIG 132 11/04/2017   Lab Results  Component Value Date   CHOLHDL 3.9 11/04/2017   No results found for: HGBA1C    Assessment & Plan:   Problem List Items Addressed This Visit       Respiratory   Asthma, severe persistent    I struggles in the fall and through the wintertime she is pretty much on an antihistamine, Singulair and Trelegy.  Unfortunately she is in her donut hole so the price of the Trelegy went up significantly did encourage her to check with the manufacturer and apply for patient assistance to see if she would qualify        Other   Localized swelling of both lower extremities - Primary    Seems to be pretty good today.  If it starts to increase again then we can always consider adding a diuretic continue to work on low-salt diet      Relevant Orders   Lipid panel   TSH   Comprehensive Metabolic Panel (CMET)   Hyperlipidemia    Due to recheck lipids.  The last time she went to her lab it was quite expensive.  We discussed maybe  trying [facility] may be its in network for her      Other Visit Diagnoses     Screening, lipid       Relevant Orders   Lipid panel   TSH   Comprehensive Metabolic Panel (CMET)   Abnormal glucose       Relevant Orders   Hemoglobin A1c      Due for full labs.  She will try to go to [facility].  No orders of the defined types were placed in this encounter.   Follow-up: No follow-ups on file.    Nani Gasser, MD

## 2021-05-29 NOTE — Assessment & Plan Note (Signed)
Due to recheck lipids.  The last time she went to her lab it was quite expensive.  We discussed maybe trying [facility] may be its in network for her

## 2021-06-03 ENCOUNTER — Other Ambulatory Visit: Payer: Self-pay | Admitting: Family Medicine

## 2021-06-26 ENCOUNTER — Other Ambulatory Visit: Payer: Self-pay | Admitting: Family Medicine

## 2021-06-26 DIAGNOSIS — M160 Bilateral primary osteoarthritis of hip: Secondary | ICD-10-CM

## 2021-07-31 ENCOUNTER — Telehealth: Payer: Self-pay | Admitting: Family Medicine

## 2021-07-31 NOTE — Telephone Encounter (Signed)
LVM for pt to call to discuss.  T. Haili Donofrio, CMA  

## 2021-07-31 NOTE — Telephone Encounter (Signed)
Please call patient and see if she would be willing to do the stool cards again or even Cologuard which is good for 3 years.  Medicare does pay for Cologuard and it is just a stool sample.

## 2021-08-04 NOTE — Telephone Encounter (Signed)
LVM advising pt that I was sending her the stool cards to do and for her to send these back in once she has completed them.

## 2021-08-06 ENCOUNTER — Other Ambulatory Visit: Payer: Self-pay | Admitting: Family Medicine

## 2021-08-06 DIAGNOSIS — J4540 Moderate persistent asthma, uncomplicated: Secondary | ICD-10-CM

## 2021-09-08 ENCOUNTER — Other Ambulatory Visit: Payer: Self-pay | Admitting: Family Medicine

## 2021-09-21 ENCOUNTER — Other Ambulatory Visit: Payer: Self-pay | Admitting: Family Medicine

## 2021-09-21 DIAGNOSIS — M160 Bilateral primary osteoarthritis of hip: Secondary | ICD-10-CM

## 2021-10-06 ENCOUNTER — Other Ambulatory Visit: Payer: Self-pay | Admitting: Family Medicine

## 2021-10-15 ENCOUNTER — Telehealth: Payer: Self-pay | Admitting: Family Medicine

## 2021-10-15 NOTE — Telephone Encounter (Signed)
Please call pt and remind her to return her stool cards

## 2021-10-16 NOTE — Telephone Encounter (Signed)
Left message advising recommendations.  

## 2021-11-03 ENCOUNTER — Other Ambulatory Visit: Payer: Self-pay | Admitting: Family Medicine

## 2021-11-11 ENCOUNTER — Other Ambulatory Visit: Payer: Self-pay | Admitting: Family Medicine

## 2021-12-06 ENCOUNTER — Other Ambulatory Visit: Payer: Self-pay | Admitting: Family Medicine

## 2021-12-29 ENCOUNTER — Other Ambulatory Visit: Payer: Self-pay | Admitting: Family Medicine

## 2021-12-29 DIAGNOSIS — M160 Bilateral primary osteoarthritis of hip: Secondary | ICD-10-CM

## 2022-01-17 ENCOUNTER — Telehealth: Payer: Medicare HMO | Admitting: Physician Assistant

## 2022-01-17 DIAGNOSIS — R21 Rash and other nonspecific skin eruption: Secondary | ICD-10-CM | POA: Diagnosis not present

## 2022-01-17 DIAGNOSIS — W57XXXA Bitten or stung by nonvenomous insect and other nonvenomous arthropods, initial encounter: Secondary | ICD-10-CM | POA: Diagnosis not present

## 2022-01-17 DIAGNOSIS — S30860A Insect bite (nonvenomous) of lower back and pelvis, initial encounter: Secondary | ICD-10-CM

## 2022-01-17 MED ORDER — DOXYCYCLINE HYCLATE 100 MG PO TABS: 100.0000 mg | ORAL_TABLET | Freq: Two times a day (BID) | ORAL | 0 refills | Status: AC

## 2022-01-17 NOTE — Progress Notes (Signed)
E-Visit for Tick Bite  Thank you for describing your tick bite, Here is how we plan to help! Based on the information that you shared with me it looks like you have An infected or complicated tick bite that requires a longer course of antibiotics and will need for you to schedule a follow-up visit with a provider.  In most cases a tick bite is painless and does not itch.  Most tick bites in which the tick is quickly removed do not require prescriptions. Ticks can transmit several diseases if they are infected and remain attacked to your skin. Therefore the length that the tick was attached and any symptoms you have experienced after the bite are important to accurately develop your custom treatment plan. In most cases a single dose of doxycycline may prevent the development of a more serious condition.  Based on your information Your symptoms indicate that you need a longer course of antibiotics and a follow up visit with a provider. I have sent doxycycline 100 mg twice a day for 21 days to the pharmacy that you selected. You will need to schedule a follow up visit with your provider. If you do not have a primary care provider you may use our telehealth physicians on the web at [email or web address]  Which ticks  are associated with illness?  The Wood Tick (dog tick) is the size of a watermelon seed and can sometimes transmit [facility] spotted fever and [place] tick fever.   The Deer Tick (black-legged tick) is between the size of a poppy seed (pin head) and an apple seed, and can sometimes transmit Lyme disease.  A brown to black tick with a white splotch on its back is likely a female Amblyomma americanum ([place] tick). This tick has been associated with Southern Tick Associated illness ( STARI)  Lyme disease has become the most common tick-borne illness in the [place]. The risk of Lyme disease following a recognized deer tick bite is estimated to be 1%.  The majority of cases of  Lyme disease start with a bull's eye rash at the site of the tick bite. The rash can occur days to weeks (typically 7-10 days) after a tick bite. Treatment with antibiotics is indicated if this rash appears. Flu-like symptoms may accompany the rash, including: fever, chills, headaches, muscle aches, and fatigue. Removing ticks promptly may prevent tick borne disease.  What can be used to prevent Tick Bites?  Insect repellant with at leas 20% DEET. Wearing long pants with sock and shoes. Avoiding tall grass and heavily wooded areas. Checking your skin after being outdoors. Shower with a washcloth after outdoor exposures.  HOME CARE ADVICE FOR TICK BITE  Wood Tick Removal:  Use a pair of tweezers and grasp the wood tick close to the skin (on its head). Pull the wood tick straight upward without twisting or crushing it. Maintain a steady pressure until it releases its grip.   If tweezers aren't available, use fingers, a loop of thread around the jaws, or a needle between the jaws for traction.  Note: covering the tick with petroleum jelly, nail polish or rubbing alcohol doesn't work. Neither does touching the tick with a hot or cold object. Tiny Deer Tick Removal:   Needs to be scraped off with a knife blade or credit card edge. Place tick in a sealed container (e.g. glass jar, zip lock plastic bag), in case your doctor wants to see it. Tick's Head Removal:  If the wood tick's  head breaks off in the skin, it must be removed. Clean the skin. Then use a sterile needle to uncover the head and lift it out or scrape it off.  If a very small piece of the head remains, the skin will eventually slough it off. Antibiotic Ointment:  Wash the wound and your hands with soap and water after removal to prevent catching any tick disease.  Apply an over the counter antibiotic ointment (e.g. bacitracin) to the bite once. Expected Course: Tick bites normally don't itch or hurt. That's why they often go  unnoticed. Call Your Doctor If:  You can't remove the tick or the tick's head Fever, a severe head ache, or rash occur in the next 2 weeks Bite begins to look infected Lyme's disease is common in your area You have not had a tetanus in the last 10 years Your current symptoms become worse    MAKE SURE YOU  Understand these instructions. Will watch your condition. Will get help right away if you are not doing well or get worse.    Thank you for choosing an e-visit.  Your e-visit answers were reviewed by a board certified advanced clinical practitioner to complete your personal care plan. Depending upon the condition, your plan could have included both over the counter or prescription medications.  Please review your pharmacy choice. Make sure the pharmacy is open so you can pick up prescription now. If there is a problem, you may contact your provider through [employer] and have the prescription routed to another pharmacy.  Your safety is important to [place]. If you have drug allergies check your prescription carefully.   For the next 24 hours you can use MyChart to ask questions about today's visit, request a non-urgent call back, or ask for a work or school excuse. You will get an email in the next two days asking about your experience. I hope that your e-visit has been valuable and will speed your recovery.  This appointment required 5-10 minutes of patient care (this includes precharting, chart review, review of results, face-to-face care, etc.).  Inda Coke PA-C

## 2022-02-20 ENCOUNTER — Other Ambulatory Visit: Payer: Self-pay | Admitting: Family Medicine

## 2022-02-25 ENCOUNTER — Other Ambulatory Visit: Payer: Self-pay | Admitting: Family Medicine

## 2022-03-07 ENCOUNTER — Other Ambulatory Visit: Payer: Self-pay | Admitting: Family Medicine

## 2022-03-07 DIAGNOSIS — J4540 Moderate persistent asthma, uncomplicated: Secondary | ICD-10-CM

## 2022-03-20 ENCOUNTER — Other Ambulatory Visit: Payer: Self-pay | Admitting: Family Medicine

## 2022-04-05 ENCOUNTER — Other Ambulatory Visit: Payer: Self-pay | Admitting: Family Medicine

## 2022-04-05 DIAGNOSIS — J4540 Moderate persistent asthma, uncomplicated: Secondary | ICD-10-CM

## 2022-04-05 DIAGNOSIS — M160 Bilateral primary osteoarthritis of hip: Secondary | ICD-10-CM

## 2022-04-15 ENCOUNTER — Other Ambulatory Visit: Payer: Self-pay | Admitting: Family Medicine

## 2022-04-15 ENCOUNTER — Encounter: Payer: Self-pay | Admitting: General Practice

## 2022-05-04 ENCOUNTER — Other Ambulatory Visit: Payer: Self-pay | Admitting: Family Medicine

## 2022-05-04 DIAGNOSIS — M160 Bilateral primary osteoarthritis of hip: Secondary | ICD-10-CM

## 2022-05-08 ENCOUNTER — Other Ambulatory Visit: Payer: Self-pay | Admitting: Family Medicine

## 2022-05-12 ENCOUNTER — Ambulatory Visit (INDEPENDENT_AMBULATORY_CARE_PROVIDER_SITE_OTHER): Payer: Medicare HMO | Admitting: Family Medicine

## 2022-05-12 ENCOUNTER — Encounter: Payer: Self-pay | Admitting: Family Medicine

## 2022-05-12 VITALS — BP 124/57 | HR 83 | Wt 269.0 lb

## 2022-05-12 DIAGNOSIS — E7849 Other hyperlipidemia: Secondary | ICD-10-CM

## 2022-05-12 DIAGNOSIS — R7309 Other abnormal glucose: Secondary | ICD-10-CM | POA: Diagnosis not present

## 2022-05-12 DIAGNOSIS — M160 Bilateral primary osteoarthritis of hip: Secondary | ICD-10-CM | POA: Diagnosis not present

## 2022-05-12 DIAGNOSIS — Z1211 Encounter for screening for malignant neoplasm of colon: Secondary | ICD-10-CM | POA: Diagnosis not present

## 2022-05-12 DIAGNOSIS — J4550 Severe persistent asthma, uncomplicated: Secondary | ICD-10-CM

## 2022-05-12 DIAGNOSIS — J449 Chronic obstructive pulmonary disease, unspecified: Secondary | ICD-10-CM | POA: Diagnosis not present

## 2022-05-12 DIAGNOSIS — Z13220 Encounter for screening for lipoid disorders: Secondary | ICD-10-CM | POA: Diagnosis not present

## 2022-05-12 LAB — POCT GLYCOSYLATED HEMOGLOBIN (HGB A1C): Hemoglobin A1C: 5.6 % (ref 4.0–5.6)

## 2022-05-12 MED ORDER — ALBUTEROL SULFATE HFA 108 (90 BASE) MCG/ACT IN AERS: INHALATION_SPRAY | RESPIRATORY_TRACT | 5 refills | Status: DC

## 2022-05-12 MED ORDER — MONTELUKAST SODIUM 10 MG PO TABS: 10.0000 mg | ORAL_TABLET | Freq: Every day | ORAL | 1 refills | Status: DC

## 2022-05-12 MED ORDER — CYCLOBENZAPRINE HCL 10 MG PO TABS: 10.0000 mg | ORAL_TABLET | Freq: Every evening | ORAL | 1 refills | Status: DC | PRN

## 2022-05-12 MED ORDER — MELOXICAM 15 MG PO TABS: ORAL_TABLET | ORAL | 0 refills | Status: DC

## 2022-05-12 MED ORDER — TRELEGY ELLIPTA 100-62.5-25 MCG/ACT IN AEPB: INHALATION_SPRAY | RESPIRATORY_TRACT | 6 refills | Status: DC

## 2022-05-12 NOTE — Assessment & Plan Note (Signed)
We will continue Trelegy.  I am also can a send over course of prednisone she is wheezing on exam today.  If she is not getting better after the prednisone then please let us know.  Really like to get a CBC to see if her eosinophils are elevated.

## 2022-05-12 NOTE — Progress Notes (Signed)
Established Patient Office Visit  Subjective   Patient ID: Robin [name], female    DOB: [birth year]  Age: 56 y.o. MRN: [id number]  Chief Complaint  Patient presents with   Asthma    HPI  Follow-up asthma-she says she really had a rough summer and most came into the office a couple times because of more severe symptoms.  Right now she feels like she is doing a little better.  She is using her Trelegy daily but will need refills.  She also needs refills on her albuterol.  Positive family history of diabetes-says she actually cut out sugar about 5 weeks ago and has had some improvement in her joint pain and her breathing.  Her sister has diabetes.  Overdue for colon cancer screening.   ROS    Objective:     BP (!) 124/57   Pulse 83   Wt 269 lb (122 kg)   LMP 05/24/2018   SpO2 99%   BMI 50.83 kg/m     Physical Exam Vitals and nursing note reviewed.  Constitutional:      Appearance: She is well-developed.  HENT:     Head: Normocephalic and atraumatic.  Cardiovascular:     Rate and Rhythm: Normal rate and regular rhythm.     Heart sounds: Normal heart sounds.  Pulmonary:     Effort: Pulmonary effort is normal.     Breath sounds: Wheezing present.     Comments: diffuse Skin:    General: Skin is warm and dry.  Neurological:     Mental Status: She is alert and oriented to person, place, and time.  Psychiatric:        Behavior: Behavior normal.      Results for orders placed or performed in visit on 05/12/22  POCT glycosylated hemoglobin (Hb A1C)  Result Value Ref Range   Hemoglobin A1C 5.6 4.0 - 5.6 %   HbA1c POC (<> result, manual entry)     HbA1c, POC (prediabetic range)     HbA1c, POC (controlled diabetic range)        The ASCVD Risk score (Arnett DK, et al., 2019) failed to calculate for the following reasons:   Cannot find a previous HDL lab   Cannot find a previous total cholesterol lab    Assessment & Plan:   Problem List Items Addressed This  Visit       Respiratory   Asthma, severe persistent    We will continue Trelegy.  I am also can a send over course of prednisone she is wheezing on exam today.  If she is not getting better after the prednisone then please let us know.  Really like to get a CBC to see if her eosinophils are elevated.      Relevant Medications   Fluticasone-Umeclidin-Vilant (TRELEGY ELLIPTA) 100-62.5-25 MCG/ACT AEPB   montelukast (SINGULAIR) 10 MG tablet   albuterol (VENTOLIN HFA) 108 (90 Base) MCG/ACT inhaler     Musculoskeletal and Integument   Degenerative arthritis of hip    Sting a refill on the meloxicam and the Flexeril.  Previously followed by Dr. Dianah Field, sports medicine.      Relevant Medications   meloxicam (MOBIC) 15 MG tablet   cyclobenzaprine (FLEXERIL) 10 MG tablet     Other   Hyperlipidemia   Relevant Orders   POCT glycosylated hemoglobin (Hb A1C) (Completed)   COMPLETE METABOLIC PANEL WITH GFR   Other Visit Diagnoses     Screening, lipid    -  Primary  Abnormal glucose       Relevant Orders   POCT glycosylated hemoglobin (Hb A1C) (Completed)   COMPLETE METABOLIC PANEL WITH GFR   Colon cancer screening       Relevant Orders   Cologuard      Encouraged her to consider getting the updated pneumonia vaccine as well as COVID-vaccine she currently declines.  Also declines the flu vaccine.  Abnormal glucose-A1c looks great today we will just monitor periodically.   I spent 30 minutes on the day of the encounter to include pre-visit record review, face-to-face time with the patient and post visit ordering of test.   Return in about 6 months (around 11/10/2022) for Asthma .    Nani Gasser, MD

## 2022-05-12 NOTE — Assessment & Plan Note (Signed)
Sting a refill on the meloxicam and the Flexeril.  Previously followed by Dr. Dianah Field, sports medicine.

## 2022-06-03 DIAGNOSIS — E7849 Other hyperlipidemia: Secondary | ICD-10-CM | POA: Diagnosis not present

## 2022-06-03 DIAGNOSIS — R7309 Other abnormal glucose: Secondary | ICD-10-CM | POA: Diagnosis not present

## 2022-06-04 LAB — COMPLETE METABOLIC PANEL WITH GFR
AG Ratio: 1.6 (calc) (ref 1.0–2.5)
ALT: 18 U/L (ref 6–29)
AST: 16 U/L (ref 10–35)
Albumin: 4.3 g/dL (ref 3.6–5.1)
Alkaline phosphatase (APISO): 68 U/L (ref 37–153)
BUN: 21 mg/dL (ref 7–25)
CO2: 25 mmol/L (ref 20–32)
Calcium: 9.8 mg/dL (ref 8.6–10.4)
Chloride: 107 mmol/L (ref 98–110)
Creat: 0.65 mg/dL (ref 0.50–1.03)
Globulin: 2.7 g/dL (calc) (ref 1.9–3.7)
Glucose, Bld: 98 mg/dL (ref 65–99)
Potassium: 4.4 mmol/L (ref 3.5–5.3)
Sodium: 141 mmol/L (ref 135–146)
Total Bilirubin: 0.3 mg/dL (ref 0.2–1.2)
Total Protein: 7 g/dL (ref 6.1–8.1)
eGFR: 102 mL/min/{1.73_m2} (ref >=60)

## 2022-06-04 NOTE — Progress Notes (Signed)
Your lab work is within acceptable range and there are no concerning findings.   ?

## 2022-06-17 ENCOUNTER — Telehealth: Payer: Medicare HMO | Admitting: Nurse Practitioner

## 2022-06-17 DIAGNOSIS — J029 Acute pharyngitis, unspecified: Secondary | ICD-10-CM

## 2022-06-17 MED ORDER — AMOXICILLIN 500 MG PO CAPS: 500.0000 mg | ORAL_CAPSULE | Freq: Two times a day (BID) | ORAL | 0 refills | Status: AC

## 2022-06-17 NOTE — Progress Notes (Signed)

## 2022-06-18 IMAGING — MG MM DIGITAL SCREENING BILAT W/ TOMO AND CAD
6 of 10 series · 6 of 30 positions shown · non-contrast
Comparison: Previous exam(s).

ACR Breast Density Category a: The breast tissue is almost entirely
fatty.

CLINICAL DATA: Screening.

EXAM:
DIGITAL SCREENING BILATERAL MAMMOGRAM WITH TOMOSYNTHESIS AND CAD
TECHNIQUE: Bilateral screening digital craniocaudal and mediolateral oblique
mammograms were obtained. Bilateral screening digital breast
tomosynthesis was performed. The images were evaluated with
computer-aided detection.

[R CC synth-2D]
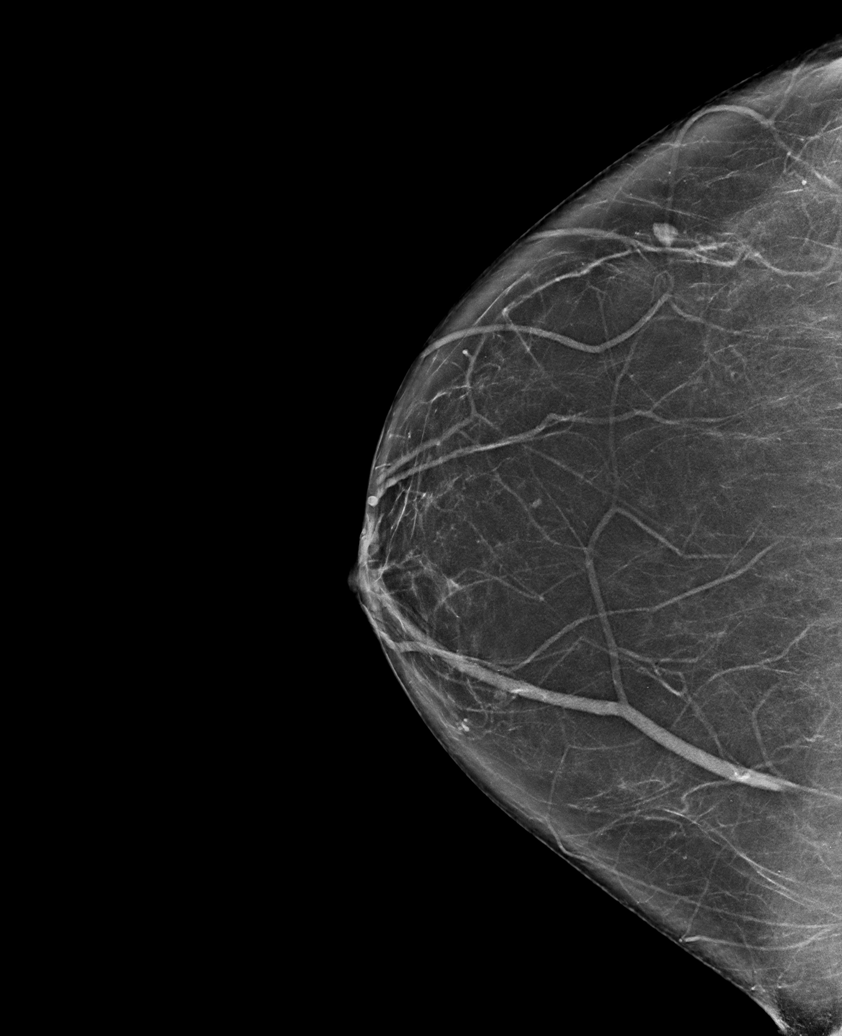

[R MLO synth-2D]
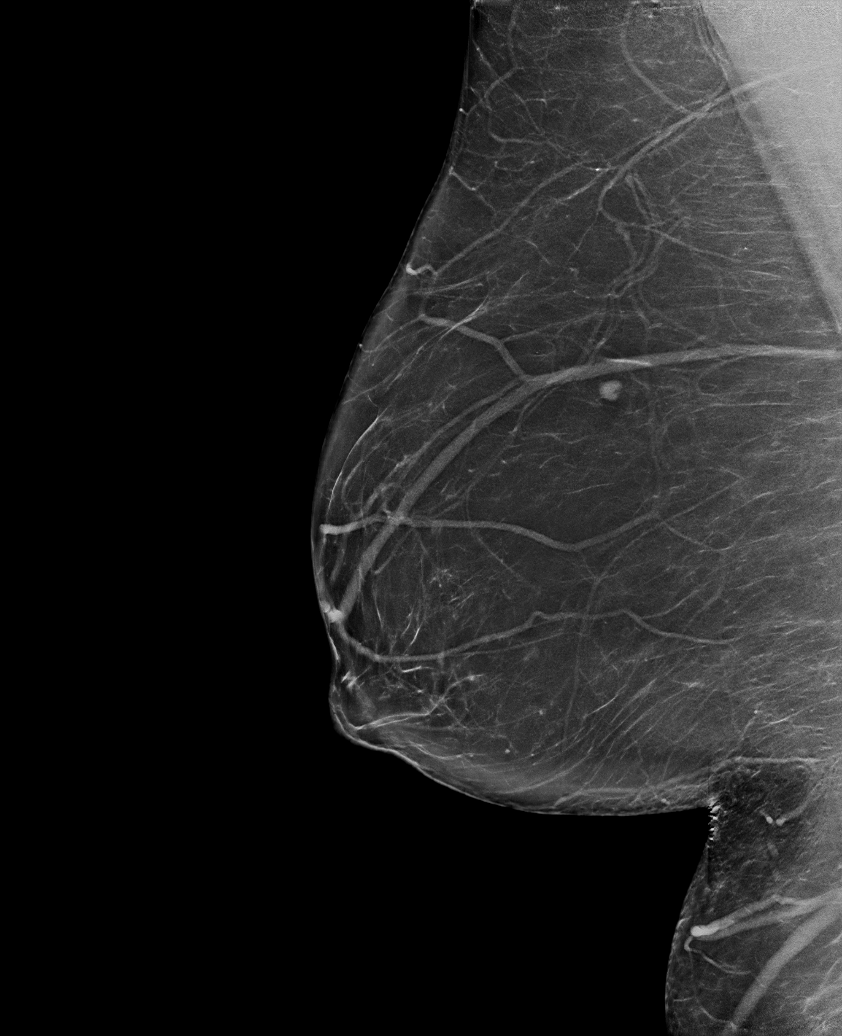

[L CC synth-2D]
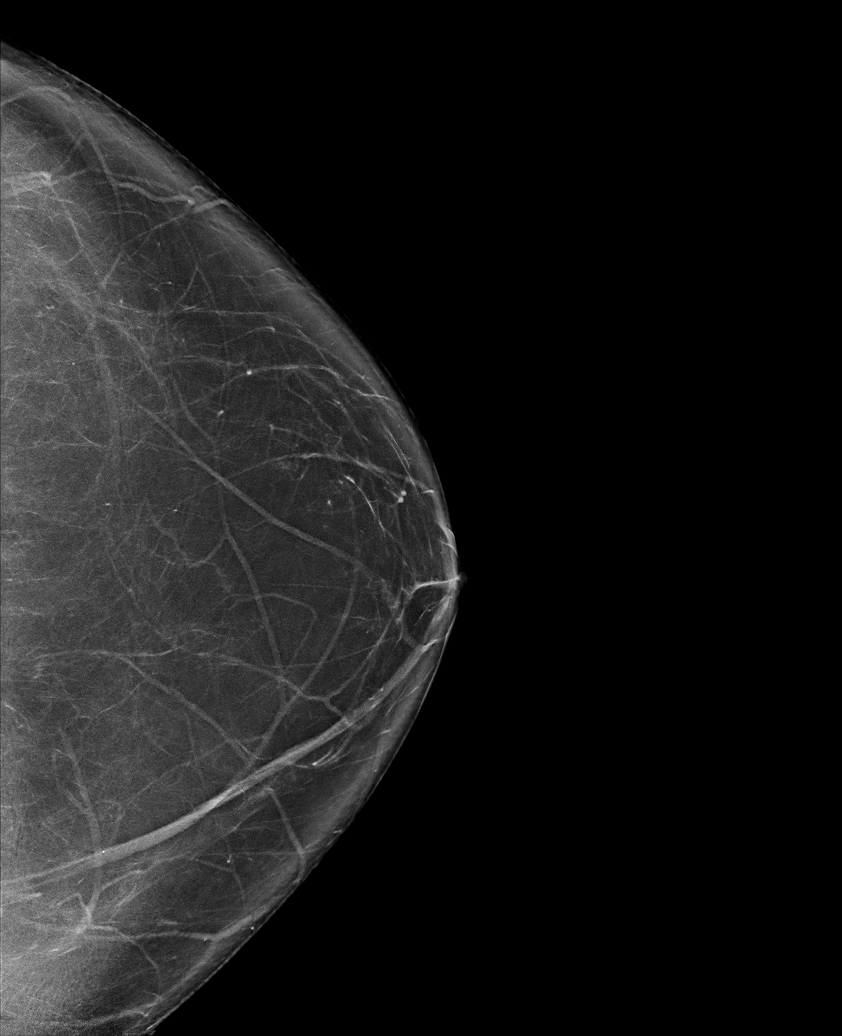

[L MLO synth-2D]
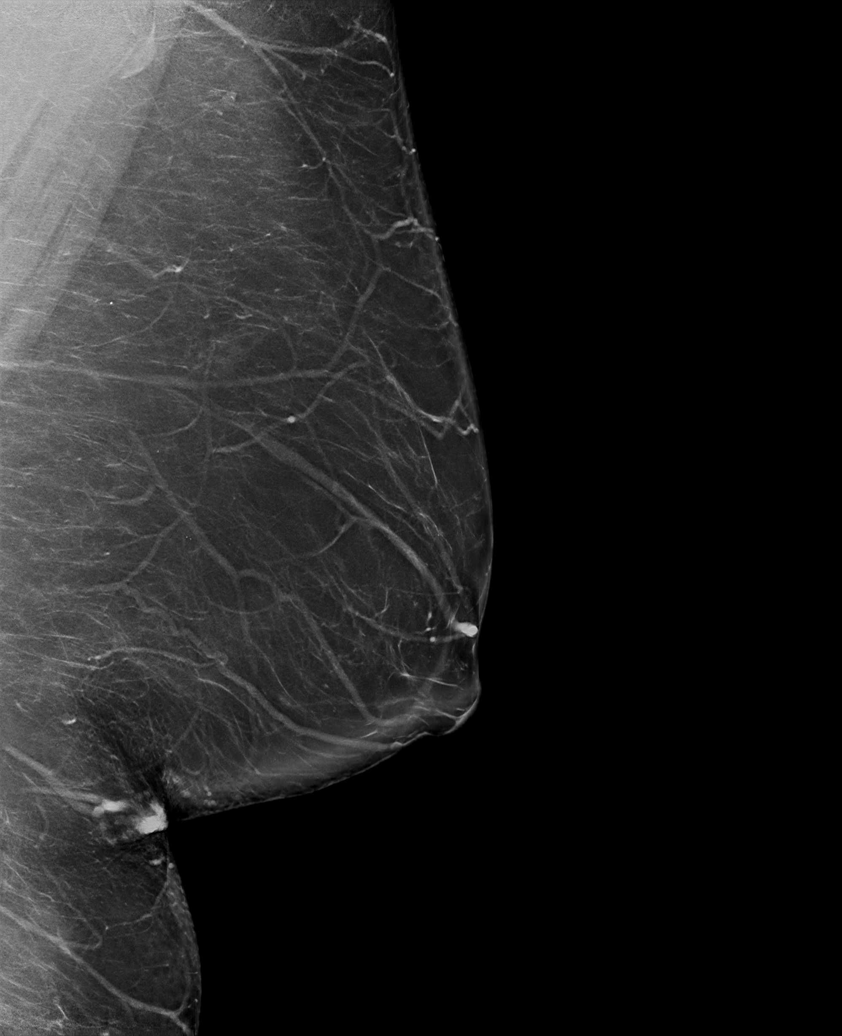

[L CV synth-2D]
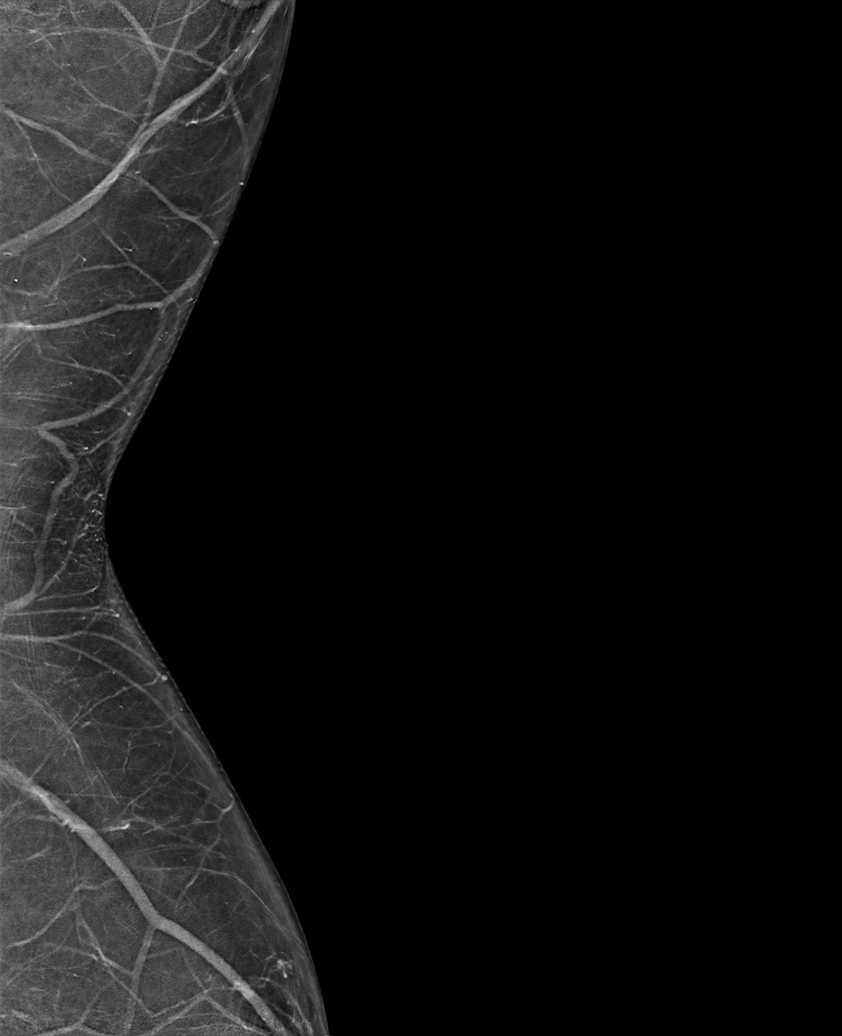

[L CV tomo · tomo slice 35/69.0]
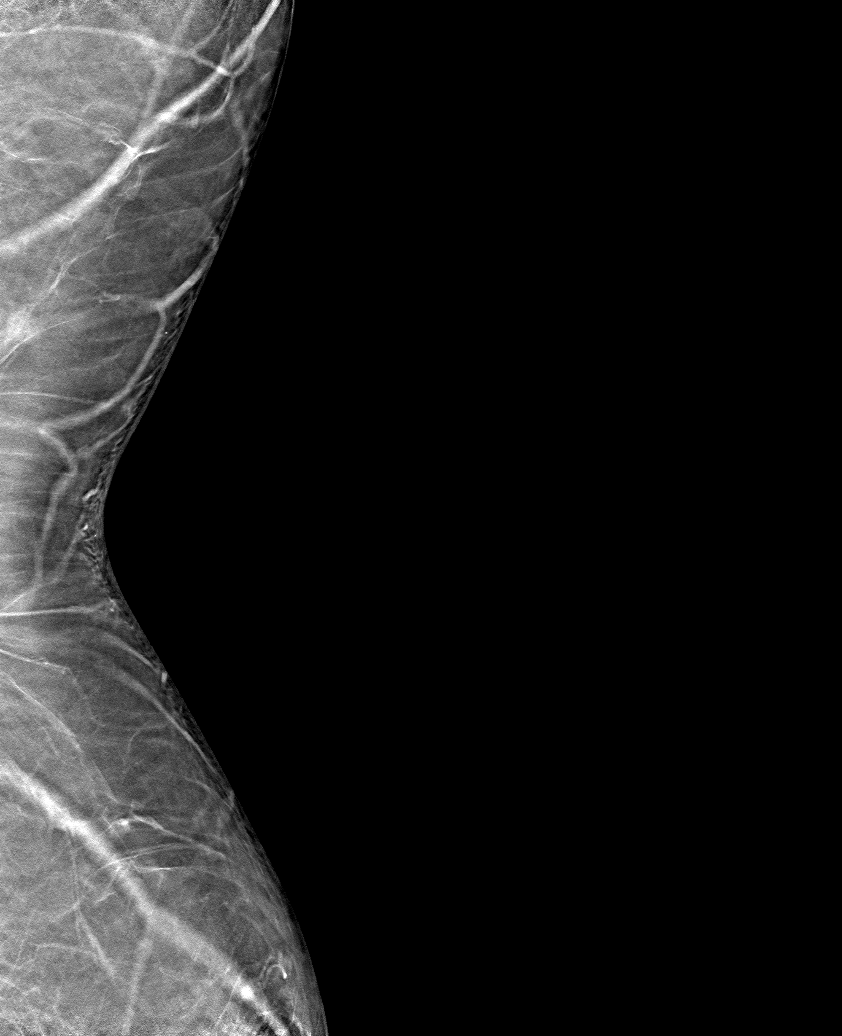

[6 of 30 positions shown; findings below may reference images not displayed]

FINDINGS: There are no findings suspicious for malignancy. The images were
evaluated with computer-aided detection.
IMPRESSION: No mammographic evidence of malignancy. A result letter of this
screening mammogram will be mailed directly to the patient.

RECOMMENDATION:
Screening mammogram in one year. (Code:[id number])

BI-RADS CATEGORY  1: Negative.

## 2022-08-29 ENCOUNTER — Other Ambulatory Visit: Payer: Self-pay | Admitting: Family Medicine

## 2022-08-29 DIAGNOSIS — M160 Bilateral primary osteoarthritis of hip: Secondary | ICD-10-CM

## 2022-09-02 NOTE — Telephone Encounter (Signed)
Pt overdue for OV please call and schedule an appt. thanks

## 2022-09-04 ENCOUNTER — Other Ambulatory Visit: Payer: Self-pay | Admitting: Family Medicine

## 2022-09-22 ENCOUNTER — Ambulatory Visit: Payer: Medicare HMO | Admitting: Family Medicine

## 2022-11-04 ENCOUNTER — Ambulatory Visit (INDEPENDENT_AMBULATORY_CARE_PROVIDER_SITE_OTHER): Payer: Medicare HMO | Admitting: Family Medicine

## 2022-11-04 ENCOUNTER — Encounter: Payer: Self-pay | Admitting: Family Medicine

## 2022-11-04 VITALS — BP 128/83 | HR 78 | Ht 61.0 in | Wt 269.0 lb

## 2022-11-04 DIAGNOSIS — Z1231 Encounter for screening mammogram for malignant neoplasm of breast: Secondary | ICD-10-CM | POA: Diagnosis not present

## 2022-11-04 DIAGNOSIS — R7309 Other abnormal glucose: Secondary | ICD-10-CM

## 2022-11-04 DIAGNOSIS — E7849 Other hyperlipidemia: Secondary | ICD-10-CM | POA: Diagnosis not present

## 2022-11-04 DIAGNOSIS — E119 Type 2 diabetes mellitus without complications: Secondary | ICD-10-CM | POA: Insufficient documentation

## 2022-11-04 DIAGNOSIS — J4489 Other specified chronic obstructive pulmonary disease: Secondary | ICD-10-CM | POA: Diagnosis not present

## 2022-11-04 DIAGNOSIS — Z6841 Body Mass Index (BMI) 40.0 and over, adult: Secondary | ICD-10-CM | POA: Diagnosis not present

## 2022-11-04 DIAGNOSIS — R7301 Impaired fasting glucose: Secondary | ICD-10-CM

## 2022-11-04 DIAGNOSIS — J4550 Severe persistent asthma, uncomplicated: Secondary | ICD-10-CM

## 2022-11-04 LAB — POCT GLYCOSYLATED HEMOGLOBIN (HGB A1C): Hemoglobin A1C: 5.9 % — AB (ref 4.0–5.6)

## 2022-11-04 NOTE — Assessment & Plan Note (Signed)
Actually been several years since we have rechecked her lipids I would like for her to get those done fasting so that we can better stratify her and make sure that she does not need any additional treatment.  Continue to work on [employer] and regular exercise.

## 2022-11-04 NOTE — Assessment & Plan Note (Signed)
New Dx. Discussed working on diet he cutting back on sugars and carbs. Work on increased activities.  F/U in 6 months.

## 2022-11-04 NOTE — Assessment & Plan Note (Signed)
Discussed the possibility of referral to an asthma specialist at some point.  She will think about it but at this point does not feel like she wants to commit to the co-pays and extra time.  Will continue with Trelegy, Singulair and as needed asthma made

## 2022-11-04 NOTE — Progress Notes (Signed)
Established Patient Office Visit  Subjective   Patient ID: Robin [name], female    DOB: 07/30/1966  Age: 57 y.o. MRN: [id number]  Chief Complaint  Patient presents with   Asthma    Patient in office for asthma f/u    Hip Pain    Patient in office for hip pain f/u    abnormal glucose    A1C test done today - POC    HPI  Follow-up asthma-currently on Trelegy.  She is also on her Singulair she is been using her albuterol a bit more since the spring pollen started to increase over the last couple of weeks.  Follow-up abnormal glucose-last A1c in the fall was 5.6.  She had cut back on carbs and sugars and recently reintroduced some carbs back into her diet but has still been staying away from a lot of sugary foods.  Lab Results  Component Value Date   CHOL 193 11/04/2017   HDL 49 (L) 11/04/2017   LDLCALC 119 (H) 11/04/2017   TRIG 132 11/04/2017   CHOLHDL 3.9 11/04/2017        ROS    Objective:     BP 128/83   Pulse 78   Ht '5\' 1"'$  (1.549 m)   Wt 269 lb 0.6 oz (122 kg)   LMP 05/24/2018   SpO2 94%   BMI 50.83 kg/m     Physical Exam Vitals and nursing note reviewed.  Constitutional:      Appearance: She is well-developed.  HENT:     Head: Normocephalic and atraumatic.  Cardiovascular:     Rate and Rhythm: Normal rate and regular rhythm.     Heart sounds: Normal heart sounds.  Pulmonary:     Effort: Pulmonary effort is normal.     Breath sounds: Wheezing present.     Comments: diffuse mild expiratory wheezing. Skin:    General: Skin is warm and dry.  Neurological:     Mental Status: She is alert and oriented to person, place, and time.  Psychiatric:        Behavior: Behavior normal.      Results for orders placed or performed in visit on 11/04/22  POCT glycosylated hemoglobin (Hb A1C)  Result Value Ref Range   Hemoglobin A1C 5.9 (A) 4.0 - 5.6 %   HbA1c POC (<> result, manual entry)     HbA1c, POC (prediabetic range)     HbA1c, POC (controlled  diabetic range)         The ASCVD Risk score (Arnett DK, et al., 2019) failed to calculate for the following reasons:   Cannot find a previous HDL lab   Cannot find a previous total cholesterol lab    Assessment & Plan:   Problem List Items Addressed This Visit       Respiratory   Asthma, severe persistent    Discussed the possibility of referral to an asthma specialist at some point.  She will think about it but at this point does not feel like she wants to commit to the co-pays and extra time.  Will continue with Trelegy, Singulair and as needed asthma made        Endocrine   IFG (impaired fasting glucose)    New Dx. Discussed working on diet he cutting back on sugars and carbs. Work on increased activities.  F/U in 6 months.         Other   Hyperlipidemia    Actually been several years since we have rechecked  her lipids I would like for her to get those done fasting so that we can better stratify her and make sure that she does not need any additional treatment.  Continue to work on [employer] and regular exercise.      Relevant Orders   Lipid Panel w/reflex Direct LDL   Other Visit Diagnoses     Abnormal glucose    -  Primary   Relevant Orders   POCT glycosylated hemoglobin (Hb A1C) (Completed)   Screening mammogram for breast cancer       Relevant Orders   MM 3D SCREENING MAMMOGRAM BILATERAL BREAST      Still due for colon cancer screening.  We had ordered Cologuard in September. She finally got her kit.   Return in about 4 months (around 03/06/2023) for A1C and asthma .    Beatrice Lecher, MD

## 2022-11-28 ENCOUNTER — Other Ambulatory Visit: Payer: Self-pay | Admitting: Family Medicine

## 2022-11-28 DIAGNOSIS — M160 Bilateral primary osteoarthritis of hip: Secondary | ICD-10-CM

## 2022-12-21 DIAGNOSIS — H524 Presbyopia: Secondary | ICD-10-CM | POA: Diagnosis not present

## 2022-12-23 ENCOUNTER — Telehealth: Payer: Self-pay | Admitting: Family Medicine

## 2022-12-23 NOTE — Telephone Encounter (Signed)
Called patient to schedule Medicare Annual Wellness Visit (AWV). Left message for patient to call back and schedule Medicare Annual Wellness Visit (AWV).  Last date of AWV: 2022  Please schedule an appointment at any time with [facility].  If any questions, please contact me at [phone].  Thank you ,  Morgan Jessup Patient Access Advocate II Direct Dial: [phone]   

## 2022-12-25 ENCOUNTER — Other Ambulatory Visit: Payer: Self-pay | Admitting: Family Medicine

## 2022-12-25 DIAGNOSIS — M160 Bilateral primary osteoarthritis of hip: Secondary | ICD-10-CM

## 2023-01-10 ENCOUNTER — Other Ambulatory Visit: Payer: Self-pay | Admitting: Family Medicine

## 2023-01-10 DIAGNOSIS — J4550 Severe persistent asthma, uncomplicated: Secondary | ICD-10-CM

## 2023-01-15 ENCOUNTER — Other Ambulatory Visit: Payer: Self-pay | Admitting: Family Medicine

## 2023-01-15 DIAGNOSIS — J4550 Severe persistent asthma, uncomplicated: Secondary | ICD-10-CM

## 2023-02-20 ENCOUNTER — Other Ambulatory Visit: Payer: Self-pay | Admitting: Family Medicine

## 2023-02-20 DIAGNOSIS — J4550 Severe persistent asthma, uncomplicated: Secondary | ICD-10-CM

## 2023-03-08 ENCOUNTER — Ambulatory Visit: Payer: Medicare HMO | Admitting: Family Medicine

## 2023-03-31 ENCOUNTER — Other Ambulatory Visit: Payer: Self-pay | Admitting: Family Medicine

## 2023-03-31 ENCOUNTER — Encounter: Payer: Self-pay | Admitting: Family Medicine

## 2023-03-31 ENCOUNTER — Ambulatory Visit (INDEPENDENT_AMBULATORY_CARE_PROVIDER_SITE_OTHER): Payer: Medicare HMO | Admitting: Family Medicine

## 2023-03-31 VITALS — BP 132/76 | HR 76 | Ht 61.0 in | Wt 281.0 lb

## 2023-03-31 DIAGNOSIS — J4550 Severe persistent asthma, uncomplicated: Secondary | ICD-10-CM | POA: Diagnosis not present

## 2023-03-31 DIAGNOSIS — R7301 Impaired fasting glucose: Secondary | ICD-10-CM | POA: Diagnosis not present

## 2023-03-31 DIAGNOSIS — E1165 Type 2 diabetes mellitus with hyperglycemia: Secondary | ICD-10-CM

## 2023-03-31 DIAGNOSIS — E7849 Other hyperlipidemia: Secondary | ICD-10-CM

## 2023-03-31 DIAGNOSIS — E119 Type 2 diabetes mellitus without complications: Secondary | ICD-10-CM | POA: Diagnosis not present

## 2023-03-31 DIAGNOSIS — M160 Bilateral primary osteoarthritis of hip: Secondary | ICD-10-CM

## 2023-03-31 LAB — POCT GLYCOSYLATED HEMOGLOBIN (HGB A1C): Hemoglobin A1C: 6.8 % — AB (ref 4.0–5.6)

## 2023-03-31 MED ORDER — LANCET DEVICE MISC
1.0000 | Freq: Every day | 0 refills | Status: DC | PRN
Start: 2023-03-31 — End: 2023-04-01

## 2023-03-31 MED ORDER — CYCLOBENZAPRINE HCL 10 MG PO TABS: 10.0000 mg | ORAL_TABLET | Freq: Every evening | ORAL | 5 refills | Status: DC | PRN

## 2023-03-31 MED ORDER — BLOOD GLUCOSE TEST VI STRP: ORAL_STRIP | 99 refills | Status: DC

## 2023-03-31 MED ORDER — MELOXICAM 15 MG PO TABS
ORAL_TABLET | ORAL | 1 refills | Status: DC
Start: 2023-03-31 — End: 2023-09-24

## 2023-03-31 MED ORDER — MONTELUKAST SODIUM 10 MG PO TABS
10.0000 mg | ORAL_TABLET | Freq: Every day | ORAL | 3 refills | Status: DC
Start: 2023-03-31 — End: 2024-04-10

## 2023-03-31 MED ORDER — PREDNISONE 20 MG PO TABS: 40.0000 mg | ORAL_TABLET | Freq: Every day | ORAL | 0 refills | Status: DC

## 2023-03-31 MED ORDER — LANCETS MISC. MISC: 1.0000 | Freq: Every day | 99 refills | Status: DC | PRN

## 2023-03-31 MED ORDER — ALBUTEROL SULFATE HFA 108 (90 BASE) MCG/ACT IN AERS
INHALATION_SPRAY | RESPIRATORY_TRACT | 5 refills | Status: DC
Start: 2023-03-31 — End: 2024-04-21

## 2023-03-31 MED ORDER — BLOOD GLUCOSE MONITORING SUPPL DEVI
1.0000 | Freq: Three times a day (TID) | 0 refills | Status: AC
Start: 2023-03-31 — End: ?

## 2023-03-31 NOTE — Assessment & Plan Note (Addendum)
A1C up  in the diabetes range. Discussed starting medication. She would ike to hold off and work on diet. Sent new order for glucometer and supplies.

## 2023-03-31 NOTE — Progress Notes (Signed)
Established Patient Office Visit  Subjective   Patient ID: Robin [name], female    DOB: 16-Jul-1966  Age: 57 y.o. MRN: [id number]  Chief Complaint  Patient presents with   Asthma   ifg    HPI  F/U Asthma - has been more SOB outside working inher garden.   Impaired fasting glucose-no increased thirst or urination. No symptoms consistent with hypoglycemia.  Has had a lot of life changes.  Her daughter and 2 grandkids have moved in with her.  She is also fostering a 5 months old infant.      ROS    Objective:     BP 132/76   Pulse 76   Ht 5\' 1"  (1.549 m)   Wt 281 lb (127.5 kg)   LMP 05/24/2018   SpO2 95%   BMI 53.09 kg/m    Physical Exam Vitals and nursing note reviewed.  Constitutional:      Appearance: She is well-developed.  HENT:     Head: Normocephalic and atraumatic.  Cardiovascular:     Rate and Rhythm: Normal rate and regular rhythm.     Heart sounds: Normal heart sounds.  Pulmonary:     Effort: Pulmonary effort is normal.     Breath sounds: Wheezing and rhonchi present.  Skin:    General: Skin is warm and dry.  Neurological:     Mental Status: She is alert and oriented to person, place, and time.  Psychiatric:        Behavior: Behavior normal.      Results for orders placed or performed in visit on 03/31/23  POCT glycosylated hemoglobin (Hb A1C)  Result Value Ref Range   Hemoglobin A1C 6.8 (A) 4.0 - 5.6 %   HbA1c POC (<> result, manual entry)     HbA1c, POC (prediabetic range)     HbA1c, POC (controlled diabetic range)        The ASCVD Risk score (Arnett DK, et al., 2019) failed to calculate for the following reasons:   Cannot find a previous HDL lab   Cannot find a previous total cholesterol lab    Assessment & Plan:   Problem List Items Addressed This Visit       Respiratory   Asthma, severe persistent    Continue Trelegy daily. Will send over round of prednisone. Refilled ventolin and singulari.       Relevant  Medications   albuterol (VENTOLIN HFA) 108 (90 Base) MCG/ACT inhaler   montelukast (SINGULAIR) 10 MG tablet   predniSONE (DELTASONE) 20 MG tablet     Endocrine   Controlled type 2 diabetes mellitus without complication, without long-term current use of insulin (HCC) - Primary    A1C up  in the diabetes range. Discussed starting medication. She would ike to hold off and work on diet. Sent new order for glucometer and supplies.         Musculoskeletal and Integument   Degenerative arthritis of hip   Relevant Medications   cyclobenzaprine (FLEXERIL) 10 MG tablet   meloxicam (MOBIC) 15 MG tablet   predniSONE (DELTASONE) 20 MG tablet     Other   Hyperlipidemia    Due  to recheck lipids. Now A1C is up consider adding statin.       Relevant Orders   Lipid Panel With LDL/HDL Ratio   CMP14+EGFR   Lipid panel   TSH   Other Visit Diagnoses     Type 2 diabetes mellitus with hyperglycemia, without long-term current use of insulin (  HCC)       Relevant Medications   Blood Glucose Monitoring Suppl DEVI   Glucose Blood (BLOOD GLUCOSE TEST STRIPS) STRP   Lancet Device MISC   Lancets Misc. MISC       Return in about 8 weeks (around 05/26/2023) for recheck glucose and see how doing  .   I spent 25 minutes on the day of the encounter to include pre-visit record review, face-to-face time with the patient and post visit ordering of test.   Nani Gasser, MD

## 2023-03-31 NOTE — Assessment & Plan Note (Signed)
Continue Trelegy daily. Will send over round of prednisone. Refilled ventolin and singulari.

## 2023-03-31 NOTE — Assessment & Plan Note (Signed)
Due  to recheck lipids. Now A1C is up consider adding statin.

## 2023-04-01 NOTE — Progress Notes (Signed)
Hi Robin [name], LDL cholesterol slightly elevated at 120.  Goal is under 100.  Just encouraged her to continue to work on healthy diet and regular exercise.  Metabolic panel is okay and thyroid level looks good.

## 2023-04-02 ENCOUNTER — Other Ambulatory Visit: Payer: Self-pay | Admitting: *Deleted

## 2023-04-02 DIAGNOSIS — E1165 Type 2 diabetes mellitus with hyperglycemia: Secondary | ICD-10-CM

## 2023-04-02 MED ORDER — ACCU-CHEK GUIDE VI STRP
ORAL_STRIP | 11 refills | Status: DC
Start: 2023-04-02 — End: 2024-04-13

## 2023-04-02 MED ORDER — ACCU-CHEK SOFTCLIX LANCETS MISC: 11 refills | Status: DC

## 2023-04-16 ENCOUNTER — Other Ambulatory Visit: Payer: Self-pay | Admitting: Family Medicine

## 2023-04-16 DIAGNOSIS — J4550 Severe persistent asthma, uncomplicated: Secondary | ICD-10-CM

## 2023-05-26 ENCOUNTER — Ambulatory Visit: Payer: Medicare HMO | Admitting: Family Medicine

## 2023-07-06 ENCOUNTER — Ambulatory Visit (INDEPENDENT_AMBULATORY_CARE_PROVIDER_SITE_OTHER): Payer: Medicare HMO | Admitting: Family Medicine

## 2023-07-06 ENCOUNTER — Encounter: Payer: Self-pay | Admitting: Family Medicine

## 2023-07-06 VITALS — BP 130/72 | HR 67 | Ht 61.0 in

## 2023-07-06 DIAGNOSIS — J4541 Moderate persistent asthma with (acute) exacerbation: Secondary | ICD-10-CM

## 2023-07-06 DIAGNOSIS — E119 Type 2 diabetes mellitus without complications: Secondary | ICD-10-CM

## 2023-07-06 LAB — POCT UA - MICROALBUMIN
Creatinine, POC: 100 mg/dL
Microalbumin Ur, POC: 80 mg/L

## 2023-07-06 LAB — POCT GLYCOSYLATED HEMOGLOBIN (HGB A1C): Hemoglobin A1C: 6 % — AB (ref 4.0–5.6)

## 2023-07-06 MED ORDER — PREDNISONE 20 MG PO TABS: 40.0000 mg | ORAL_TABLET | Freq: Every day | ORAL | 0 refills | Status: DC

## 2023-07-06 MED ORDER — AZITHROMYCIN 250 MG PO TABS
ORAL_TABLET | ORAL | 0 refills | Status: AC
Start: 2023-07-06 — End: 2023-07-11

## 2023-07-06 MED ORDER — FLUCONAZOLE 150 MG PO TABS: 150.0000 mg | ORAL_TABLET | Freq: Once | ORAL | 1 refills | Status: AC

## 2023-07-06 NOTE — Assessment & Plan Note (Signed)
When seen much improved and back down to 6.0 today with her dietary changes.  She is also been using an app called my net diary to help her keep track of caloric intake she is mostly put her focus on cutting back on sugars but really wants to cut back on carbs as well.  She has not really had any major changes in physical activity since I last saw her.  Follow-up in 3 months.

## 2023-07-06 NOTE — Progress Notes (Signed)
Established Patient Office Visit  Subjective   Patient ID: Robin [name], female    DOB: 17-Feb-1966  Age: 57 y.o. MRN: [id number]  Chief Complaint  Patient presents with   ifg    [facility] [phone]    HPI  Diabetes - no hypoglycemic events. No wounds or sores that are not healing well. No increased thirst or urination. Checking glucose at home.  She really wanted to work on diet and activity level before going on medication she says she has been checking her blood sugars with her new glucometer.  The highest glucose that she has seen fasting is 108.  She has seen some 89's especially if she has not eaten then comes home from work.  Also been sick with a cough for several weeks.  It went around the whole house.  She said it was really bad for about 2 weeks.  Is not as bad as it was but she still has a mucousy cough and has still felt more short of breath with some wheezing.    ROS    Objective:     BP 130/72   Pulse 67   Ht 5\' 1"  (1.549 m)   LMP 05/24/2018   SpO2 97%   BMI 53.09 kg/m    Physical Exam Vitals and nursing note reviewed.  Constitutional:      Appearance: Normal appearance.  HENT:     Head: Normocephalic and atraumatic.     Right Ear: Tympanic membrane, ear canal and external ear normal. There is no impacted cerumen.     Left Ear: Tympanic membrane, ear canal and external ear normal. There is no impacted cerumen.     Nose: Nose normal.     Mouth/Throat:     Pharynx: Oropharynx is clear.  Eyes:     Conjunctiva/sclera: Conjunctivae normal.  Cardiovascular:     Rate and Rhythm: Normal rate and regular rhythm.  Pulmonary:     Effort: Pulmonary effort is normal.     Breath sounds: Wheezing and rhonchi present.  Musculoskeletal:     Cervical back: Neck supple. No tenderness.  Lymphadenopathy:     Cervical: No cervical adenopathy.  Skin:    General: Skin is warm and dry.  Neurological:     Mental Status: She is alert and oriented to  person, place, and time.  Psychiatric:        Mood and Affect: Mood normal.      Results for orders placed or performed in visit on 07/06/23  POCT HgB A1C  Result Value Ref Range   Hemoglobin A1C 6.0 (A) 4.0 - 5.6 %   HbA1c POC (<> result, manual entry)     HbA1c, POC (prediabetic range)     HbA1c, POC (controlled diabetic range)    POCT UA - Microalbumin  Result Value Ref Range   Microalbumin Ur, POC 80 mg/L   Creatinine, POC 100 mg/dL   Albumin/Creatinine Ratio, Urine, POC 30-300       The 10-year ASCVD risk score (Arnett DK, et al., 2019) is: 6.1%    Assessment & Plan:   Problem List Items Addressed This Visit       Endocrine   Controlled type 2 diabetes mellitus without complication, without long-term current use of insulin (HCC)    When seen much improved and back down to 6.0 today with her dietary changes.  She is also been using an app called my net diary to help her keep track of caloric intake she  is mostly put her focus on cutting back on sugars but really wants to cut back on carbs as well.  She has not really had any major changes in physical activity since I last saw her.  Follow-up in 3 months.      Relevant Orders   POCT HgB A1C (Completed)   POCT UA - Microalbumin (Completed)   Other Visit Diagnoses     Moderate persistent asthma with exacerbation    -  Primary   Relevant Medications   azithromycin (ZITHROMAX) 250 MG tablet   fluconazole (DIFLUCAN) 150 MG tablet   predniSONE (DELTASONE) 20 MG tablet       Asthma exacerbation she has Fuhs wheezing on exam today and has had a cough for over 2 weeks at this point.  Will go and treat with azithromycin to cover for mycoplasma which is probably evident in the community right now.  She does tend to get yeast infections I will send over a tab of Diflucan.  And also treat with a round of prednisone.  She has been using her inhalers consistently.  Return in about 3 months (around 10/13/2023) for Diabetes  follow-up.    Nani Gasser, MD

## 2023-09-24 ENCOUNTER — Other Ambulatory Visit: Payer: Self-pay | Admitting: Family Medicine

## 2023-09-24 DIAGNOSIS — M160 Bilateral primary osteoarthritis of hip: Secondary | ICD-10-CM

## 2023-10-07 ENCOUNTER — Ambulatory Visit: Payer: Medicare HMO | Admitting: Family Medicine

## 2023-12-21 ENCOUNTER — Other Ambulatory Visit: Payer: Self-pay | Admitting: Family Medicine

## 2023-12-21 DIAGNOSIS — M160 Bilateral primary osteoarthritis of hip: Secondary | ICD-10-CM

## 2024-03-18 ENCOUNTER — Other Ambulatory Visit: Payer: Self-pay | Admitting: Family Medicine

## 2024-03-18 DIAGNOSIS — M160 Bilateral primary osteoarthritis of hip: Secondary | ICD-10-CM

## 2024-04-01 ENCOUNTER — Other Ambulatory Visit: Payer: Self-pay | Admitting: Family Medicine

## 2024-04-09 ENCOUNTER — Other Ambulatory Visit: Payer: Self-pay | Admitting: Family Medicine

## 2024-04-09 DIAGNOSIS — J4550 Severe persistent asthma, uncomplicated: Secondary | ICD-10-CM

## 2024-04-13 ENCOUNTER — Other Ambulatory Visit: Payer: Self-pay | Admitting: Family Medicine

## 2024-04-13 DIAGNOSIS — E1165 Type 2 diabetes mellitus with hyperglycemia: Secondary | ICD-10-CM

## 2024-04-21 ENCOUNTER — Other Ambulatory Visit: Payer: Self-pay | Admitting: Family Medicine

## 2024-04-21 DIAGNOSIS — J4550 Severe persistent asthma, uncomplicated: Secondary | ICD-10-CM

## 2024-04-28 ENCOUNTER — Other Ambulatory Visit: Payer: Self-pay | Admitting: Family Medicine

## 2024-04-28 DIAGNOSIS — J4550 Severe persistent asthma, uncomplicated: Secondary | ICD-10-CM

## 2024-05-01 ENCOUNTER — Other Ambulatory Visit: Payer: Self-pay | Admitting: Family Medicine

## 2024-05-01 DIAGNOSIS — E1165 Type 2 diabetes mellitus with hyperglycemia: Secondary | ICD-10-CM

## 2024-05-18 NOTE — Progress Notes (Signed)
 Robin [name]                                          MRN: [id number]   05/18/2024   The [facility] Quality Team Specialist reviewed this patient medical record for the purposes of chart review for care gap closure. The following were reviewed: chart review for care gap closure-glycemic status assessment and kidney health evaluation for diabetes:eGFR  and uACR.    [facility] Quality Team

## 2024-06-12 ENCOUNTER — Other Ambulatory Visit: Payer: Self-pay | Admitting: Family Medicine

## 2024-06-12 DIAGNOSIS — J4550 Severe persistent asthma, uncomplicated: Secondary | ICD-10-CM

## 2024-06-16 ENCOUNTER — Other Ambulatory Visit: Payer: Self-pay | Admitting: Family Medicine

## 2024-06-16 DIAGNOSIS — M160 Bilateral primary osteoarthritis of hip: Secondary | ICD-10-CM

## 2024-06-19 NOTE — Progress Notes (Signed)
 Monnie Gudgel                                          MRN: [id number]   06/19/2024   The [facility] Quality Team Specialist reviewed this patient medical record for the purposes of chart review for care gap closure. The following were reviewed: chart review for care gap closure-glycemic status assessment.    [facility] Quality Team

## 2024-06-20 ENCOUNTER — Other Ambulatory Visit: Payer: Self-pay | Admitting: *Deleted

## 2024-06-20 ENCOUNTER — Encounter: Payer: Self-pay | Admitting: Family Medicine

## 2024-06-20 ENCOUNTER — Ambulatory Visit (INDEPENDENT_AMBULATORY_CARE_PROVIDER_SITE_OTHER): Admitting: Family Medicine

## 2024-06-20 VITALS — BP 130/83 | HR 62 | Ht 61.0 in | Wt 261.0 lb

## 2024-06-20 DIAGNOSIS — J4550 Severe persistent asthma, uncomplicated: Secondary | ICD-10-CM

## 2024-06-20 DIAGNOSIS — E7849 Other hyperlipidemia: Secondary | ICD-10-CM | POA: Diagnosis not present

## 2024-06-20 DIAGNOSIS — M160 Bilateral primary osteoarthritis of hip: Secondary | ICD-10-CM | POA: Diagnosis not present

## 2024-06-20 DIAGNOSIS — M25512 Pain in left shoulder: Secondary | ICD-10-CM

## 2024-06-20 DIAGNOSIS — M25559 Pain in unspecified hip: Secondary | ICD-10-CM

## 2024-06-20 DIAGNOSIS — E119 Type 2 diabetes mellitus without complications: Secondary | ICD-10-CM

## 2024-06-20 LAB — POCT GLYCOSYLATED HEMOGLOBIN (HGB A1C): Hemoglobin A1C: 5.8 % — AB (ref 4.0–5.6)

## 2024-06-20 NOTE — Assessment & Plan Note (Signed)
 Type 2 diabetes mellitus Well-controlled with A1c of 5.8%. - Encourage scheduling an eye exam covered by Medicare. - Perform urine test for proteinuria.

## 2024-06-20 NOTE — Assessment & Plan Note (Signed)
 Moderate persistent asthma Asthma symptoms mild with occasional exacerbation due to weather changes. - Continue Trelegy and albuterol  as needed.

## 2024-06-20 NOTE — Progress Notes (Signed)
 Established Patient Office Visit  Patient ID: Robin [name], female    DOB: 1966/06/29  Age: 58 y.o. MRN: [id number] PCP: Alvan Dorothyann BIRCH, MD  Chief Complaint  Patient presents with   Hypertension   Diabetes    Subjective:     HPI  Discussed the use of AI scribe software for clinical note transcription with the patient, who gave verbal consent to proceed.  History of Present Illness Robin [name] is a 58 year old female with diabetes who presents for a routine follow-up visit.  Glycemic control - Diabetes mellitus with ongoing monitoring of A1c levels - A1c was excellent at last visit - No current medication refills needed - No recent eye exam in the past year due to insurance issues; last exam was over one year ago  Respiratory symptoms - Breathing affected by seasonal changes, experiencing a 'fall cough' common in her household - Continues to use Trelegy inhaler - Albuterol  available for as-needed use - Discontinued omeprazole , which was difficult but manageable - Experienced a week during the summer with significantly improved breathing  Shoulder pain. Left  - Chronic left shoulder pain for several years, worsened by activities such as putting hair in a ponytail - No pain when lifting her grandchild - No imaging performed for this issue - Manages pain with a TENS unit, which provides some relief  Hip pain - Left hip pain, particularly bothersome at night and when sleeping - History of bilateral hip replacements - Suspects pain may be related to her back  Preventive health maintenance - Avoids mammograms and other testing due to personal preference - Relies on pharmacy to manage prescriptions  Sleep disturbance - Sleep sometimes affected by sharing bed with a toddler, impacting sleep position      ROS    Objective:     BP 130/83   Pulse 62   Ht 5' 1 (1.549 m)   Wt 261 lb (118.4 kg)   LMP 05/24/2018   SpO2 97%   BMI 49.32 kg/m      Physical Exam Vitals and nursing note reviewed.  Constitutional:      Appearance: Normal appearance.  HENT:     Head: Normocephalic and atraumatic.  Eyes:     Conjunctiva/sclera: Conjunctivae normal.  Cardiovascular:     Rate and Rhythm: Normal rate and regular rhythm.  Pulmonary:     Effort: Pulmonary effort is normal.     Breath sounds: Normal breath sounds.  Musculoskeletal:     Comments: Able to extend to 90 degress but hard to reach up and back and unable to reach lower back. Normal ext rotation.   Skin:    General: Skin is warm and dry.  Neurological:     Mental Status: She is alert.  Psychiatric:        Mood and Affect: Mood normal.      Results for orders placed or performed in visit on 06/20/24  POCT HgB A1C  Result Value Ref Range   Hemoglobin A1C 5.8 (A) 4.0 - 5.6 %   HbA1c POC (<> result, manual entry)     HbA1c, POC (prediabetic range)     HbA1c, POC (controlled diabetic range)        The 10-year ASCVD risk score (Arnett DK, et al., 2019) is: 6.7%    Assessment & Plan:   Problem List Items Addressed This Visit       Respiratory   Asthma, severe persistent (HCC)   Moderate persistent asthma Asthma symptoms mild  with occasional exacerbation due to weather changes. - Continue Trelegy and albuterol  as needed.      Relevant Orders   Urine Microalbumin w/creat. ratio   CMP14+EGFR   Lipid panel   CBC     Endocrine   Controlled type 2 diabetes mellitus without complication, without long-term current use of insulin (HCC) - Primary   Type 2 diabetes mellitus Well-controlled with A1c of 5.8%. - Encourage scheduling an eye exam covered by Medicare. - Perform urine test for proteinuria.      Relevant Orders   POCT HgB A1C (Completed)   Urine Microalbumin w/creat. ratio   CMP14+EGFR   Lipid panel   CBC     Musculoskeletal and Integument   Degenerative arthritis of hip     Other   Hyperlipidemia   Relevant Orders   Urine Microalbumin  w/creat. ratio   CMP14+EGFR   Lipid panel   CBC   Other Visit Diagnoses       Acute pain of left shoulder       Relevant Orders   Urine Microalbumin w/creat. ratio   CMP14+EGFR   Lipid panel   CBC     Hip pain, unspecified laterality           Assessment and Plan Assessment & Plan   Left shoulder pain with limited range of motion, possible arthritis Chronic pain with limited range of motion, possible arthritis or bursitis. No imaging done. - Provide handout for range of motion exercises. - Advise heat before exercises and heat or ice after based on comfort. - Consider x-ray if symptoms worsen or persist.  Left hip pain after bilateral hip replacement, likely referred from back Pain likely referred from back, not joint-related due to hip replacements. - Suggest towel roll under hip or pillow between knees while sleeping.    Return in about 6 months (around 12/19/2024) for Diabetes follow-up.    Dorothyann Byars, MD [facility]

## 2024-06-21 LAB — LIPID PANEL
Chol/HDL Ratio: 4.3 ratio (ref 0.0–4.4)
Cholesterol, Total: 203 mg/dL — ABNORMAL HIGH (ref 100–199)
HDL: 47 mg/dL (ref >39)
LDL Chol Calc (NIH): 121 mg/dL — ABNORMAL HIGH (ref 0–99)
Triglycerides: 199 mg/dL — ABNORMAL HIGH (ref 0–149)
VLDL Cholesterol Cal: 35 mg/dL (ref 5–40)

## 2024-06-21 LAB — CBC
Hematocrit: 45.3 % (ref 34.0–46.6)
Hemoglobin: 14.4 g/dL (ref 11.1–15.9)
MCH: 29.1 pg (ref 26.6–33.0)
MCHC: 31.8 g/dL (ref 31.5–35.7)
MCV: 92 fL (ref 79–97)
Platelets: 320 x10E3/uL (ref 150–450)
RBC: 4.95 x10E6/uL (ref 3.77–5.28)
RDW: 12.8 % (ref 11.7–15.4)
WBC: 9 x10E3/uL (ref 3.4–10.8)

## 2024-06-21 LAB — CMP14+EGFR
ALT: 15 IU/L (ref 0–32)
AST: 16 IU/L (ref 0–40)
Albumin: 4.3 g/dL (ref 3.8–4.9)
Alkaline Phosphatase: 63 IU/L (ref 49–135)
BUN/Creatinine Ratio: 20 (ref 12–28)
BUN: 18 mg/dL (ref 8–27)
Bilirubin Total: 0.4 mg/dL (ref 0.0–1.2)
CO2: 23 mmol/L (ref 20–29)
Calcium: 10.2 mg/dL (ref 8.7–10.3)
Chloride: 104 mmol/L (ref 96–106)
Creatinine, Ser: 0.89 mg/dL (ref 0.57–1.00)
Globulin, Total: 2.7 g/dL (ref 1.5–4.5)
Glucose: 85 mg/dL (ref 70–99)
Potassium: 4.6 mmol/L (ref 3.5–5.2)
Sodium: 140 mmol/L (ref 134–144)
Total Protein: 7 g/dL (ref 6.0–8.5)
eGFR: 74 mL/min/1.73 (ref >59)

## 2024-06-21 LAB — SPECIMEN STATUS REPORT

## 2024-06-21 LAB — MICROALBUMIN / CREATININE URINE RATIO
Creatinine, Urine: 71.3 mg/dL
Microalb/Creat Ratio: 21 mg/g{creat} (ref 0–29)
Microalbumin, Urine: 15.2 ug/mL

## 2024-06-22 ENCOUNTER — Ambulatory Visit: Payer: Self-pay | Admitting: Family Medicine

## 2024-06-22 NOTE — Progress Notes (Signed)
 Hi Katelynne, LDL cholesterol and triglycerides are up a little bit.  Continue to work on healthy Mediterranean diet and regular exercise shooting for 30 minutes, 5 days a week.  Kidney and liver function are stable.  Hemoglobin looks good.  No anemia.  No excess protein in the urine which is great.  The 10-year ASCVD risk score (Arnett DK, et al., 2019) is: 7.3%   Values used to calculate the score:     Age: 58 years     Clincally relevant sex: Female     Is Non-Hispanic African American: No     Diabetic: Yes     Tobacco smoker: No     Systolic Blood Pressure: 130 mmHg     Is BP treated: No     HDL Cholesterol: 47 mg/dL     Total Cholesterol: 203 mg/dL

## 2024-07-04 ENCOUNTER — Other Ambulatory Visit: Payer: Self-pay | Admitting: Family Medicine

## 2024-07-06 ENCOUNTER — Other Ambulatory Visit: Payer: Self-pay | Admitting: Family Medicine

## 2024-07-06 DIAGNOSIS — J4550 Severe persistent asthma, uncomplicated: Secondary | ICD-10-CM

## 2024-07-14 ENCOUNTER — Other Ambulatory Visit: Payer: Self-pay | Admitting: Family Medicine

## 2024-07-14 DIAGNOSIS — J4550 Severe persistent asthma, uncomplicated: Secondary | ICD-10-CM

## 2024-07-15 ENCOUNTER — Other Ambulatory Visit: Payer: Self-pay | Admitting: Family Medicine

## 2024-07-15 DIAGNOSIS — J4550 Severe persistent asthma, uncomplicated: Secondary | ICD-10-CM

## 2024-09-12 ENCOUNTER — Other Ambulatory Visit: Payer: Self-pay | Admitting: Family Medicine

## 2024-09-12 DIAGNOSIS — M160 Bilateral primary osteoarthritis of hip: Secondary | ICD-10-CM

## 2024-12-19 ENCOUNTER — Ambulatory Visit: Admitting: Family Medicine
